# Patient Record
Sex: Female | Born: 1955 | Race: Black or African American | Hispanic: Yes | Marital: Married | Smoking: Former smoker
Health system: Southern US, Community
[De-identification: ages and names within clinical notes are randomized; demographics above are authoritative.]

## PROBLEM LIST (undated history)

## (undated) DIAGNOSIS — F411 Generalized anxiety disorder: Secondary | ICD-10-CM

## (undated) DIAGNOSIS — J438 Other emphysema: Secondary | ICD-10-CM

## (undated) DIAGNOSIS — M2550 Pain in unspecified joint: Secondary | ICD-10-CM

## (undated) DIAGNOSIS — I10 Essential (primary) hypertension: Secondary | ICD-10-CM

## (undated) DIAGNOSIS — N951 Menopausal and female climacteric states: Secondary | ICD-10-CM

## (undated) DIAGNOSIS — F988 Other specified behavioral and emotional disorders with onset usually occurring in childhood and adolescence: Secondary | ICD-10-CM

## (undated) DIAGNOSIS — Z8719 Personal history of other diseases of the digestive system: Secondary | ICD-10-CM

## (undated) DIAGNOSIS — E785 Hyperlipidemia, unspecified: Secondary | ICD-10-CM

## (undated) DIAGNOSIS — I7300 Raynaud's syndrome without gangrene: Secondary | ICD-10-CM

## (undated) DIAGNOSIS — M899 Disorder of bone, unspecified: Secondary | ICD-10-CM

## (undated) DIAGNOSIS — M949 Disorder of cartilage, unspecified: Secondary | ICD-10-CM

## (undated) DIAGNOSIS — G5600 Carpal tunnel syndrome, unspecified upper limb: Secondary | ICD-10-CM

## (undated) DIAGNOSIS — Z86010 Personal history of colonic polyps: Secondary | ICD-10-CM

## (undated) DIAGNOSIS — F329 Major depressive disorder, single episode, unspecified: Secondary | ICD-10-CM

## (undated) DIAGNOSIS — J309 Allergic rhinitis, unspecified: Secondary | ICD-10-CM

## (undated) DIAGNOSIS — N959 Unspecified menopausal and perimenopausal disorder: Secondary | ICD-10-CM

## (undated) DIAGNOSIS — E663 Overweight: Secondary | ICD-10-CM

## (undated) DIAGNOSIS — N750 Cyst of Bartholin's gland: Secondary | ICD-10-CM

## (undated) DIAGNOSIS — G4700 Insomnia, unspecified: Secondary | ICD-10-CM

## (undated) HISTORY — DX: Personal history of other diseases of the digestive system: Z87.19

## (undated) HISTORY — DX: Hyperlipidemia, unspecified: E78.5

## (undated) HISTORY — DX: Other specified behavioral and emotional disorders with onset usually occurring in childhood and adolescence: F98.8

## (undated) HISTORY — DX: Unspecified menopausal and perimenopausal disorder: N95.9

## (undated) HISTORY — DX: Pain in unspecified joint: M25.50

## (undated) HISTORY — DX: Insomnia, unspecified: G47.00

## (undated) HISTORY — PX: BARTHOLIN GLAND CYST EXCISION: SHX565

## (undated) HISTORY — DX: Disorder of bone, unspecified: M89.9

## (undated) HISTORY — DX: Overweight: E66.3

## (undated) HISTORY — DX: Cyst of Bartholin's gland: N75.0

## (undated) HISTORY — DX: Carpal tunnel syndrome, unspecified upper limb: G56.00

## (undated) HISTORY — DX: Essential (primary) hypertension: I10

## (undated) HISTORY — DX: Other emphysema: J43.8

## (undated) HISTORY — DX: Raynaud's syndrome without gangrene: I73.00

## (undated) HISTORY — DX: Disorder of cartilage, unspecified: M94.9

## (undated) HISTORY — DX: Menopausal and female climacteric states: N95.1

## (undated) HISTORY — DX: Personal history of colonic polyps: Z86.010

## (undated) HISTORY — DX: Allergic rhinitis, unspecified: J30.9

## (undated) HISTORY — PX: TONSILLECTOMY: SHX5217

## (undated) HISTORY — DX: Generalized anxiety disorder: F41.1

## (undated) HISTORY — DX: Major depressive disorder, single episode, unspecified: F32.9

---

## 1999-07-13 ENCOUNTER — Other Ambulatory Visit: Admission: RE | Admit: 1999-07-13 | Discharge: 1999-07-13 | Payer: Self-pay | Admitting: Internal Medicine

## 2000-06-27 ENCOUNTER — Other Ambulatory Visit: Admission: RE | Admit: 2000-06-27 | Discharge: 2000-06-27 | Payer: Self-pay | Admitting: Internal Medicine

## 2001-05-29 ENCOUNTER — Other Ambulatory Visit: Admission: RE | Admit: 2001-05-29 | Discharge: 2001-05-29 | Payer: Self-pay | Admitting: Internal Medicine

## 2003-05-10 ENCOUNTER — Other Ambulatory Visit: Admission: RE | Admit: 2003-05-10 | Discharge: 2003-05-10 | Payer: Self-pay | Admitting: Internal Medicine

## 2004-07-21 ENCOUNTER — Other Ambulatory Visit: Admission: RE | Admit: 2004-07-21 | Discharge: 2004-07-21 | Payer: Self-pay | Admitting: Obstetrics and Gynecology

## 2005-06-01 ENCOUNTER — Ambulatory Visit: Payer: Self-pay | Admitting: Internal Medicine

## 2005-06-07 ENCOUNTER — Ambulatory Visit: Payer: Self-pay | Admitting: Internal Medicine

## 2005-08-11 ENCOUNTER — Other Ambulatory Visit: Admission: RE | Admit: 2005-08-11 | Discharge: 2005-08-11 | Payer: Self-pay | Admitting: Obstetrics and Gynecology

## 2006-08-08 ENCOUNTER — Ambulatory Visit: Payer: Self-pay | Admitting: Internal Medicine

## 2006-08-11 ENCOUNTER — Ambulatory Visit: Payer: Self-pay | Admitting: Internal Medicine

## 2006-08-16 ENCOUNTER — Other Ambulatory Visit: Admission: RE | Admit: 2006-08-16 | Discharge: 2006-08-16 | Payer: Self-pay | Admitting: Obstetrics and Gynecology

## 2006-08-23 ENCOUNTER — Ambulatory Visit (HOSPITAL_COMMUNITY): Admission: RE | Admit: 2006-08-23 | Discharge: 2006-08-23 | Payer: Self-pay | Admitting: Obstetrics and Gynecology

## 2006-09-08 ENCOUNTER — Ambulatory Visit: Payer: Self-pay | Admitting: Gastroenterology

## 2006-09-23 ENCOUNTER — Ambulatory Visit: Payer: Self-pay | Admitting: Gastroenterology

## 2006-12-27 ENCOUNTER — Encounter: Payer: Self-pay | Admitting: Internal Medicine

## 2006-12-27 LAB — CONVERTED CEMR LAB: Pap Smear: NORMAL

## 2007-02-14 ENCOUNTER — Ambulatory Visit: Payer: Self-pay | Admitting: Internal Medicine

## 2007-02-27 ENCOUNTER — Ambulatory Visit: Payer: Self-pay | Admitting: Internal Medicine

## 2007-03-09 ENCOUNTER — Encounter: Payer: Self-pay | Admitting: Cardiology

## 2007-03-09 ENCOUNTER — Ambulatory Visit: Payer: Self-pay

## 2007-05-04 ENCOUNTER — Ambulatory Visit: Payer: Self-pay | Admitting: Internal Medicine

## 2007-08-20 DIAGNOSIS — I10 Essential (primary) hypertension: Secondary | ICD-10-CM | POA: Insufficient documentation

## 2007-08-20 DIAGNOSIS — G5600 Carpal tunnel syndrome, unspecified upper limb: Secondary | ICD-10-CM | POA: Insufficient documentation

## 2007-08-20 DIAGNOSIS — F329 Major depressive disorder, single episode, unspecified: Secondary | ICD-10-CM

## 2007-08-20 DIAGNOSIS — E663 Overweight: Secondary | ICD-10-CM | POA: Insufficient documentation

## 2007-08-20 DIAGNOSIS — I7300 Raynaud's syndrome without gangrene: Secondary | ICD-10-CM

## 2007-08-20 DIAGNOSIS — Z860100 Personal history of colon polyps, unspecified: Secondary | ICD-10-CM | POA: Insufficient documentation

## 2007-08-20 DIAGNOSIS — F3289 Other specified depressive episodes: Secondary | ICD-10-CM

## 2007-08-20 DIAGNOSIS — N750 Cyst of Bartholin's gland: Secondary | ICD-10-CM

## 2007-08-20 DIAGNOSIS — E785 Hyperlipidemia, unspecified: Secondary | ICD-10-CM

## 2007-08-20 DIAGNOSIS — F32A Depression, unspecified: Secondary | ICD-10-CM | POA: Insufficient documentation

## 2007-08-20 HISTORY — DX: Other specified depressive episodes: F32.89

## 2007-08-20 HISTORY — DX: Carpal tunnel syndrome, unspecified upper limb: G56.00

## 2007-08-20 HISTORY — DX: Raynaud's syndrome without gangrene: I73.00

## 2007-08-20 HISTORY — DX: Hyperlipidemia, unspecified: E78.5

## 2007-08-20 HISTORY — DX: Cyst of Bartholin's gland: N75.0

## 2007-08-20 HISTORY — DX: Major depressive disorder, single episode, unspecified: F32.9

## 2007-08-20 HISTORY — DX: Personal history of colonic polyps: Z86.010

## 2007-08-20 HISTORY — DX: Personal history of colon polyps, unspecified: Z86.0100

## 2007-08-20 HISTORY — DX: Overweight: E66.3

## 2007-08-20 HISTORY — DX: Essential (primary) hypertension: I10

## 2007-09-22 ENCOUNTER — Ambulatory Visit: Payer: Self-pay | Admitting: Internal Medicine

## 2007-09-22 LAB — CONVERTED CEMR LAB
ALT: 19 units/L (ref 0–35)
AST: 19 units/L (ref 0–37)
Albumin: 3.3 g/dL — ABNORMAL LOW (ref 3.5–5.2)
BUN: 13 mg/dL (ref 6–23)
Bilirubin Urine: NEGATIVE
Bilirubin, Direct: 0.1 mg/dL (ref 0.0–0.3)
Chloride: 110 meq/L (ref 96–112)
Creatinine, Ser: 1.2 mg/dL (ref 0.4–1.2)
Eosinophils Absolute: 0.2 10*3/uL (ref 0.0–0.6)
Eosinophils Relative: 2.3 % (ref 0.0–5.0)
GFR calc non Af Amer: 51 mL/min
Glucose, Bld: 94 mg/dL (ref 70–99)
HDL: 56.1 mg/dL (ref >39.0)
Hemoglobin: 13.1 g/dL (ref 12.0–15.0)
Leukocytes, UA: NEGATIVE
Lymphocytes Relative: 35.1 % (ref 12.0–46.0)
MCHC: 34.3 g/dL (ref 30.0–36.0)
MCV: 85.5 fL (ref 78.0–100.0)
Monocytes Absolute: 0.4 10*3/uL (ref 0.2–0.7)
Neutro Abs: 3.9 10*3/uL (ref 1.4–7.7)
RDW: 13.4 % (ref 11.5–14.6)
Specific Gravity, Urine: 1.015 (ref 1.000–1.03)
TSH: 2.14 microintl units/mL (ref 0.35–5.50)
Total Protein, Urine: NEGATIVE mg/dL
Urine Glucose: NEGATIVE mg/dL
WBC: 6.9 10*3/uL (ref 4.5–10.5)

## 2007-09-29 ENCOUNTER — Ambulatory Visit: Payer: Self-pay | Admitting: Internal Medicine

## 2007-10-01 ENCOUNTER — Encounter: Payer: Self-pay | Admitting: Internal Medicine

## 2007-10-01 DIAGNOSIS — J309 Allergic rhinitis, unspecified: Secondary | ICD-10-CM | POA: Insufficient documentation

## 2007-10-01 DIAGNOSIS — G4700 Insomnia, unspecified: Secondary | ICD-10-CM | POA: Insufficient documentation

## 2007-10-01 DIAGNOSIS — Z8719 Personal history of other diseases of the digestive system: Secondary | ICD-10-CM

## 2007-10-01 HISTORY — DX: Insomnia, unspecified: G47.00

## 2007-10-01 HISTORY — DX: Allergic rhinitis, unspecified: J30.9

## 2007-10-01 HISTORY — DX: Personal history of other diseases of the digestive system: Z87.19

## 2008-02-29 ENCOUNTER — Ambulatory Visit: Payer: Self-pay | Admitting: Endocrinology

## 2008-02-29 DIAGNOSIS — R109 Unspecified abdominal pain: Secondary | ICD-10-CM | POA: Insufficient documentation

## 2008-03-01 LAB — CONVERTED CEMR LAB
ALT: 19 units/L (ref 0–35)
AST: 17 units/L (ref 0–37)
Alkaline Phosphatase: 43 units/L (ref 39–117)
BUN: 7 mg/dL (ref 6–23)
Basophils Relative: 4.9 % — ABNORMAL HIGH (ref 0.0–1.0)
Bilirubin, Direct: 0.1 mg/dL (ref 0.0–0.3)
CO2: 27 meq/L (ref 19–32)
Calcium: 9 mg/dL (ref 8.4–10.5)
Chloride: 107 meq/L (ref 96–112)
Creatinine, Ser: 1.1 mg/dL (ref 0.4–1.2)
Eosinophils Relative: 1.7 % (ref 0.0–5.0)
GFR calc Af Amer: 68 mL/min
Glucose, Bld: 86 mg/dL (ref 70–99)
Monocytes Relative: 4.4 % (ref 3.0–11.0)
Platelets: 273 10*3/uL (ref 150–400)
RDW: 13.4 % (ref 11.5–14.6)
Total Bilirubin: 0.4 mg/dL (ref 0.3–1.2)
Total Protein: 6.2 g/dL (ref 6.0–8.3)
WBC: 8.5 10*3/uL (ref 4.5–10.5)

## 2008-07-15 ENCOUNTER — Encounter: Payer: Self-pay | Admitting: Internal Medicine

## 2008-10-07 ENCOUNTER — Ambulatory Visit: Payer: Self-pay | Admitting: Internal Medicine

## 2008-10-07 LAB — CONVERTED CEMR LAB
ALT: 18 units/L (ref 0–35)
AST: 19 units/L (ref 0–37)
Basophils Absolute: 0 10*3/uL (ref 0.0–0.1)
Basophils Relative: 0.5 % (ref 0.0–3.0)
Bilirubin, Direct: 0.1 mg/dL (ref 0.0–0.3)
CO2: 26 meq/L (ref 19–32)
Chloride: 110 meq/L (ref 96–112)
Ketones, ur: NEGATIVE mg/dL
LDL Cholesterol: 114 mg/dL — ABNORMAL HIGH (ref 0–99)
Leukocytes, UA: NEGATIVE
Lymphocytes Relative: 45.2 % (ref 12.0–46.0)
MCHC: 34.2 g/dL (ref 30.0–36.0)
Monocytes Relative: 5.5 % (ref 3.0–12.0)
Neutrophils Relative %: 46.5 % (ref 43.0–77.0)
Nitrite: NEGATIVE
RBC: 4.63 M/uL (ref 3.87–5.11)
RDW: 13.4 % (ref 11.5–14.6)
Sodium: 141 meq/L (ref 135–145)
Specific Gravity, Urine: 1.015 (ref 1.000–1.03)
Total Bilirubin: 0.6 mg/dL (ref 0.3–1.2)
Total CHOL/HDL Ratio: 3.1
Triglycerides: 102 mg/dL (ref 0–149)
Urobilinogen, UA: 0.2 (ref 0.0–1.0)

## 2008-11-06 ENCOUNTER — Ambulatory Visit: Payer: Self-pay | Admitting: Internal Medicine

## 2008-11-06 DIAGNOSIS — M2550 Pain in unspecified joint: Secondary | ICD-10-CM | POA: Insufficient documentation

## 2008-11-06 DIAGNOSIS — IMO0002 Reserved for concepts with insufficient information to code with codable children: Secondary | ICD-10-CM | POA: Insufficient documentation

## 2008-11-06 HISTORY — DX: Pain in unspecified joint: M25.50

## 2008-11-18 ENCOUNTER — Encounter: Payer: Self-pay | Admitting: Internal Medicine

## 2008-11-22 ENCOUNTER — Telehealth: Payer: Self-pay | Admitting: Internal Medicine

## 2008-12-03 ENCOUNTER — Encounter: Payer: Self-pay | Admitting: Internal Medicine

## 2009-01-07 ENCOUNTER — Encounter: Payer: Self-pay | Admitting: Internal Medicine

## 2009-02-04 ENCOUNTER — Encounter: Payer: Self-pay | Admitting: Internal Medicine

## 2009-03-12 ENCOUNTER — Encounter: Payer: Self-pay | Admitting: Internal Medicine

## 2009-09-26 ENCOUNTER — Ambulatory Visit: Payer: Self-pay | Admitting: Internal Medicine

## 2009-09-26 DIAGNOSIS — N390 Urinary tract infection, site not specified: Secondary | ICD-10-CM | POA: Insufficient documentation

## 2009-09-26 DIAGNOSIS — N951 Menopausal and female climacteric states: Secondary | ICD-10-CM | POA: Insufficient documentation

## 2009-09-26 HISTORY — DX: Menopausal and female climacteric states: N95.1

## 2009-09-26 LAB — CONVERTED CEMR LAB
Bilirubin Urine: NEGATIVE
Ketones, ur: NEGATIVE mg/dL
Urine Glucose: NEGATIVE mg/dL
pH: 5.5 (ref 5.0–8.0)

## 2009-10-08 ENCOUNTER — Encounter: Admission: RE | Admit: 2009-10-08 | Discharge: 2009-10-08 | Payer: Self-pay | Admitting: Neurosurgery

## 2009-10-21 ENCOUNTER — Encounter: Admission: RE | Admit: 2009-10-21 | Discharge: 2009-10-21 | Payer: Self-pay | Admitting: Neurosurgery

## 2009-10-31 ENCOUNTER — Ambulatory Visit: Payer: Self-pay | Admitting: Internal Medicine

## 2009-10-31 LAB — CONVERTED CEMR LAB
ALT: 19 units/L (ref 0–35)
AST: 22 units/L (ref 0–37)
Bilirubin Urine: NEGATIVE
Bilirubin, Direct: 0.1 mg/dL (ref 0.0–0.3)
CO2: 27 meq/L (ref 19–32)
Calcium: 9.6 mg/dL (ref 8.4–10.5)
Cholesterol: 211 mg/dL — ABNORMAL HIGH (ref 0–200)
Direct LDL: 140.6 mg/dL
Glucose, Bld: 88 mg/dL (ref 70–99)
Hemoglobin, Urine: NEGATIVE
Hemoglobin: 12.9 g/dL (ref 12.0–15.0)
Ketones, ur: NEGATIVE mg/dL
Lymphocytes Relative: 42.6 % (ref 12.0–46.0)
MCHC: 33.6 g/dL (ref 30.0–36.0)
RDW: 13.5 % (ref 11.5–14.6)
Sodium: 141 meq/L (ref 135–145)
Total Bilirubin: 0.8 mg/dL (ref 0.3–1.2)
Total Protein, Urine: NEGATIVE mg/dL
Total Protein: 6.6 g/dL (ref 6.0–8.3)
Triglycerides: 104 mg/dL (ref 0.0–149.0)
Urine Glucose: NEGATIVE mg/dL
pH: 5.5 (ref 5.0–8.0)

## 2009-11-07 ENCOUNTER — Ambulatory Visit: Payer: Self-pay | Admitting: Internal Medicine

## 2009-11-07 ENCOUNTER — Telehealth: Payer: Self-pay | Admitting: Internal Medicine

## 2009-11-07 DIAGNOSIS — N959 Unspecified menopausal and perimenopausal disorder: Secondary | ICD-10-CM | POA: Insufficient documentation

## 2009-11-07 HISTORY — DX: Unspecified menopausal and perimenopausal disorder: N95.9

## 2009-11-11 ENCOUNTER — Encounter: Payer: Self-pay | Admitting: Internal Medicine

## 2009-12-12 ENCOUNTER — Telehealth: Payer: Self-pay | Admitting: Internal Medicine

## 2009-12-12 ENCOUNTER — Encounter: Payer: Self-pay | Admitting: Internal Medicine

## 2010-02-19 ENCOUNTER — Ambulatory Visit: Payer: Self-pay | Admitting: Internal Medicine

## 2010-05-11 ENCOUNTER — Ambulatory Visit: Payer: Self-pay | Admitting: Internal Medicine

## 2010-05-11 DIAGNOSIS — H6120 Impacted cerumen, unspecified ear: Secondary | ICD-10-CM | POA: Insufficient documentation

## 2010-05-11 DIAGNOSIS — J029 Acute pharyngitis, unspecified: Secondary | ICD-10-CM | POA: Insufficient documentation

## 2010-06-09 ENCOUNTER — Ambulatory Visit: Payer: Self-pay | Admitting: Internal Medicine

## 2010-06-09 DIAGNOSIS — R059 Cough, unspecified: Secondary | ICD-10-CM | POA: Insufficient documentation

## 2010-06-09 DIAGNOSIS — R05 Cough: Secondary | ICD-10-CM

## 2010-06-10 ENCOUNTER — Encounter: Payer: Self-pay | Admitting: Internal Medicine

## 2010-06-16 ENCOUNTER — Ambulatory Visit: Payer: Self-pay | Admitting: Internal Medicine

## 2010-06-16 DIAGNOSIS — B373 Candidiasis of vulva and vagina: Secondary | ICD-10-CM | POA: Insufficient documentation

## 2010-06-16 DIAGNOSIS — M21619 Bunion of unspecified foot: Secondary | ICD-10-CM | POA: Insufficient documentation

## 2010-06-16 DIAGNOSIS — M899 Disorder of bone, unspecified: Secondary | ICD-10-CM

## 2010-06-16 DIAGNOSIS — M949 Disorder of cartilage, unspecified: Secondary | ICD-10-CM

## 2010-06-16 DIAGNOSIS — J438 Other emphysema: Secondary | ICD-10-CM

## 2010-06-16 HISTORY — DX: Other emphysema: J43.8

## 2010-06-16 HISTORY — DX: Disorder of bone, unspecified: M89.9

## 2010-08-21 ENCOUNTER — Ambulatory Visit: Payer: Self-pay | Admitting: Internal Medicine

## 2010-08-21 DIAGNOSIS — M25519 Pain in unspecified shoulder: Secondary | ICD-10-CM | POA: Insufficient documentation

## 2010-09-04 ENCOUNTER — Telehealth: Payer: Self-pay | Admitting: Internal Medicine

## 2010-09-09 ENCOUNTER — Encounter: Payer: Self-pay | Admitting: Internal Medicine

## 2010-09-25 ENCOUNTER — Encounter: Admission: RE | Admit: 2010-09-25 | Discharge: 2010-09-25 | Payer: Self-pay | Admitting: Orthopedic Surgery

## 2010-11-04 ENCOUNTER — Ambulatory Visit: Payer: Self-pay | Admitting: Internal Medicine

## 2010-11-04 LAB — CONVERTED CEMR LAB
ALT: 28 units/L (ref 0–35)
Alkaline Phosphatase: 76 units/L (ref 39–117)
Basophils Absolute: 0 10*3/uL (ref 0.0–0.1)
Basophils Relative: 0.3 % (ref 0.0–3.0)
Bilirubin, Direct: 0 mg/dL (ref 0.0–0.3)
CO2: 28 meq/L (ref 19–32)
Chloride: 108 meq/L (ref 96–112)
Cholesterol: 186 mg/dL (ref 0–200)
Creatinine, Ser: 1 mg/dL (ref 0.4–1.2)
Eosinophils Relative: 3 % (ref 0.0–5.0)
Glucose, Bld: 87 mg/dL (ref 70–99)
Hemoglobin: 13.9 g/dL (ref 12.0–15.0)
LDL Cholesterol: 111 mg/dL — ABNORMAL HIGH (ref 0–99)
Lymphocytes Relative: 45 % (ref 12.0–46.0)
Monocytes Relative: 6.1 % (ref 3.0–12.0)
Neutro Abs: 2.9 10*3/uL (ref 1.4–7.7)
RBC: 4.68 M/uL (ref 3.87–5.11)
RDW: 14.2 % (ref 11.5–14.6)
Specific Gravity, Urine: 1.02 (ref 1.000–1.030)
Total CHOL/HDL Ratio: 3
Total Protein: 6.5 g/dL (ref 6.0–8.3)
Urobilinogen, UA: 0.2 (ref 0.0–1.0)
WBC: 6.4 10*3/uL (ref 4.5–10.5)

## 2010-11-10 ENCOUNTER — Ambulatory Visit: Payer: Self-pay | Admitting: Internal Medicine

## 2010-11-10 ENCOUNTER — Encounter: Payer: Self-pay | Admitting: Internal Medicine

## 2010-11-10 DIAGNOSIS — F411 Generalized anxiety disorder: Secondary | ICD-10-CM

## 2010-11-10 DIAGNOSIS — F988 Other specified behavioral and emotional disorders with onset usually occurring in childhood and adolescence: Secondary | ICD-10-CM

## 2010-11-10 HISTORY — DX: Generalized anxiety disorder: F41.1

## 2010-11-10 HISTORY — DX: Other specified behavioral and emotional disorders with onset usually occurring in childhood and adolescence: F98.8

## 2011-01-22 ENCOUNTER — Telehealth: Payer: Self-pay | Admitting: Internal Medicine

## 2011-01-28 NOTE — Letter (Signed)
Summary: Results Follow-up Letter  [facility]  [address] [place], [place] [address]   Phone: [phone]  Fax: [phone]    06/10/2010  [address] [place], [place]  [address]  Dear Ms. PASSARELLI,   The following are the results of your recent test(s):  Test     Result     Chest Xray     Emphysema   _________________________________________________________  Please call for an appointment soon _________________________________________________________ _________________________________________________________ _________________________________________________________  Sincerely,  Sanda Linger MD [facility]

## 2011-01-28 NOTE — Assessment & Plan Note (Signed)
Summary: CPX/NWS   Vital Signs:  Patient profile:   55 year old female Height:      63 inches Weight:      150.50 pounds BMI:     26.76 O2 Sat:      98 % on Room air Temp:     97.4 degrees F oral Pulse rate:   62 / minute BP sitting:   100 / 62  (left arm) Cuff size:   regular  Vitals Entered By: Zella Ball Ewing CMA Duncan Dull) (November 10, 2010 1:24 PM)  O2 Flow:  Room air  Preventive Care Screening  Bone Density:    Date:  11/07/2009    Next Due:  10/2011    Results:  abnormal std dev  Mammogram:    Date:  09/09/2010    Results:  normal   Pap Smear:    Date:  07/27/2010    Results:  normal      had flu shot approx one month ago  CC: ADult Physical/RE   Primary Care [facility]brough:  Corwin Levins MD  CC:  ADult Physical/RE.  History of Present Illness: here for wellness - hard to lose wt tough she has been doing better with lower chol diet; Pt denies CP, worsening sob, doe, wheezing, orthopnea, pnd, worsening LE edema, palps, dizziness or syncope  Pt denies new neuro symptoms such as headache, facial or extremity weakness  Pt denies polydipsia, polyuria. Overall good compliance with meds, trying to follow low chol  diet, wt stable, little excercise however Denies worsening depressive symptoms, suicidal ideation, or panic, but does have singificant ongoing anxiety and asks to re-start the lexapro she improved with in the past  Overall good compliance with meds, and good tolerability.  No fever, wt loss, night sweats, loss of appetite or other constitutional symptoms  Pt states good ability with ADL's, low fall risk, home safety reviewed and adequate, no significant change in hearing or vision, trying to follow lower chol diet, and occasionally active only with regular excercise.  Daughter with ADD adn pt asks for referral for evaluation as she has similar symtpoms  Problems Prior to Update: 1)  Anxiety  (ICD-300.00) 2)  Attention Deficit Disorder  (ICD-314.00) 3)  Shoulder Pain,  Right  (ICD-719.41) 4)  Osteopenia  (ICD-733.90) 5)  Vaginitis, Candidal  (ICD-112.1) 6)  Bunion, Left Foot  (ICD-727.1) 7)  Emphysema  (ICD-492.8) 8)  Cough  (ICD-786.2) 9)  Cerumen Impaction  (ICD-380.4) 10)  Acute Pharyngitis  (ICD-462) 11)  Menopausal Disorder  (ICD-627.9) 12)  Preventive Health Care  (ICD-V70.0) 13)  Hot Flashes  (ICD-627.2) 14)  Insomnia-sleep Disorder-unspec  (ICD-780.52) 15)  Uti  (ICD-599.0) 16)  Polyarthralgia  (ICD-719.49) 17)  Lumbar Radiculopathy, Left  (ICD-724.4) 18)  Preventive Health Care  (ICD-V70.0) 19)  Abdominal Pain, Unspecified Site  (ICD-789.00) 20)  Preventive Health Care  (ICD-V70.0) 21)  Family History of Cad Female 1st Degree Relative <60  (ICD-V16.49) 22)  Ischemic Colitis, Hx of  (ICD-V12.79) 23)  Allergic Rhinitis  (ICD-477.9) 24)  Hx of Symptom, Insomnia Nos  (ICD-780.52) 25)  Hyperlipidemia  (ICD-272.4) 26)  Obesity, 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 1 By Mouth Once Daily 2)  Tramadol Hcl 50  Mg Tabs (Tramadol Hcl) .Marland Kitchen.. 1 -2 By Mouth Qid As Needed 3)  Zolpidem Tartrate 10 Mg Tabs (Zolpidem Tartrate) .Marland Kitchen.. 1po At Bedtime As Needed 4)  Flexeril 10 Mg Tabs (Cyclobenzaprine Hcl) .Marland Kitchen.. 1 By Mouth Three Times A Day As Needed For Muscle Spasm - May Cause Sedation 5)  Mytussin Dac 30-10-100 Mg/44ml Soln (Pseudoephedrine-Codeine-Gg) .... 5-10 Ml By Mouth Qid As Needed For Cough 6)  Combivent 18-103 Mcg/act Aero (Ipratropium-Albuterol) .... 2 Puffs Four Times Per Day As Needed 7)  Prednisone 10 Mg Tabs (Prednisone) .... 3po Qd For 3days, Then 2po Qd For 3days, Then 1po Qd For 3days, Then Stop  Current Medications (verified): 1)  Nifediac Cc 30 Mg  Tb24 (Nifedipine) .Marland Kitchen.. 1  By Mouth Once Daily 2)  Tramadol Hcl 50 Mg Tabs (Tramadol Hcl) .Marland Kitchen.. 1 -2 By Mouth Qid As Needed 3)  Zolpidem Tartrate 10 Mg Tabs (Zolpidem Tartrate) .Marland Kitchen.. 1po At Bedtime As Needed 4)  Flexeril 10 Mg Tabs (Cyclobenzaprine Hcl) .Marland Kitchen.. 1 By Mouth Three Times A Day As Needed For Muscle Spasm - May Cause Sedation 5)  Combivent 18-103 Mcg/act Aero (Ipratropium-Albuterol) .... 2 Puffs Four Times Per Day As Needed 6)  Vagifem 10 Mcg Tabs (Estradiol) .... Use Asd Twice Weekly 7)  Lexapro 10 Mg Tabs (Escitalopram Oxalate) .Marland Kitchen.. 1po Once Daily  Allergies (verified): No Known Drug Allergies  Past History:  Past Surgical History: Last updated: 08/20/2007 Tonsillectomy Bartholin cyst incision and drainage  Family History: Last updated: 11/07/2009 Family History of CAD Female 1st degree relative <60 Family History Hypertension Family History of Prostate CA 1st degree relative <50 Family History Thyroid disease 1 daughter iwth ADD mother and grandmother with osteoporosis  Social History: Last updated: 08/21/2010 Occupation:legal assistant Former Smoker Alcohol use-yes Drug use-no 1 daughter  Risk Factors: Alcohol Use: 0 (06/09/2010) Exercise: yes (10/01/2007)  Risk Factors: Smoking Status: quit (06/09/2010)  Past Medical History: Hx of Bartholin Cyst Carpal Tunnel Syndrome Raynaud's syndrome Depression Hypertension Colonic polyps, hx of   Mild Obesity Hyperlipidemia insomnia Allergic rhinitis ischemic colitis Anxiety  Review of Systems  The patient denies anorexia, fever, vision loss, decreased hearing, hoarseness, chest pain, syncope, dyspnea on exertion, peripheral edema, prolonged cough, headaches, hemoptysis, abdominal pain, melena, hematochezia, severe indigestion/heartburn, hematuria, muscle weakness, suspicious skin lesions, transient blindness, difficulty walking, depression, unusual weight change, abnormal bleeding, enlarged lymph nodes, and angioedema.         all  otherwise negative per pt -  except for recurrent sciatica though much less as she does yoga often;  also with recurrent neck pain   Impression & Recommendations:  Problem # 1:  Preventive Health Care (ICD-V70.0) Overall doing well, age appropriate education and counseling updated, referral for preventive services and immunizations addressed, dietary counseling and smoking status adressed , most recent labs reviewed, ecg reviewed I have personally reviewed and have noted 1.The patient's medical and social history 2.Their use of alcohol, tobacco or illicit drugs 3.Their current medications and supplements 4. Functional ability including ADL's, fall risk, home safety risk, hearing & visual impairment   5.Diet and physical activities 6.Evidence for depression or mood disorders The patients weight, height, BMI  have been recorded in the chart I have made referrals, counseling and provided education to the patient based review of the above  Orders: EKG w/ Interpretation (93000)  Problem # 2:  ATTENTION DEFICIT DISORDER (ICD-314.00) ok for referral Orders: Psychology Referral (Psychology)  Problem # 3:  ANXIETY (ICD-300.00)  Her updated medication list for this  problem includes:    Lexapro 10 Mg Tabs (Escitalopram oxalate) .Marland Kitchen... 1po once daily treat as above, f/u any worsening signs or symptoms   Complete Medication List: 1)  Nifediac Cc 30 Mg Tb24 (Nifedipine) .Marland Kitchen.. 1 by mouth once daily 2)  Tramadol Hcl 50 Mg Tabs (Tramadol hcl) .Marland Kitchen.. 1 -2 by mouth qid as needed 3)  Zolpidem Tartrate 10 Mg Tabs (Zolpidem tartrate) .Marland Kitchen.. 1po at bedtime as needed 4)  Flexeril 10 Mg Tabs (Cyclobenzaprine hcl) .Marland Kitchen.. 1 by mouth three times a day as needed for muscle spasm - may cause sedation 5)  Combivent 18-103 Mcg/act Aero (Ipratropium-albuterol) .... 2 puffs four times per day as needed 6)  Vagifem 10 Mcg Tabs (Estradiol) .... Use asd twice weekly 7)  Lexapro 10 Mg Tabs (Escitalopram oxalate) .Marland Kitchen.. 1po  once daily  Other Orders: Tdap => 74yrs IM (09811) Admin 1st Vaccine (91478)  Patient Instructions: 1)  you had the tetanus shot today 2)  Please take all new medications as prescribed - the lexapro 10 mg per day 3)  Continue all previous medications as before this visit  4)  You will be contacted about the referral(s) to: ADD testing with psychology 5)  Please schedule a follow-up appointment in 1 year, or sooner if needed Prescriptions: LEXAPRO 10 MG TABS (ESCITALOPRAM OXALATE) 1po once daily  #90 x 3   Entered and Authorized by:   Corwin Levins MD   Signed by:   Corwin Levins MD on 11/10/2010   Method used:   Print then Give to Patient   RxID:   [id number] COMBIVENT 18-103 MCG/ACT AERO (IPRATROPIUM-ALBUTEROL) 2 puffs four times per day as needed  #3 x 3   Entered and Authorized by:   Corwin Levins MD   Signed by:   Corwin Levins MD on 11/10/2010   Method used:   Print then Give to Patient   RxID:   [id number] FLEXERIL 10 MG TABS (CYCLOBENZAPRINE HCL) 1 by mouth three times a day as needed for muscle spasm - may cause sedation  #90 x 1   Entered and Authorized by:   Corwin Levins MD   Signed by:   Corwin Levins MD on 11/10/2010   Method used:   Print then Give to Patient   RxID:   [id number] TRAMADOL HCL 50 MG TABS (TRAMADOL HCL) 1 -2 by mouth qid as needed  #120 x 1   Entered and Authorized by:   Corwin Levins MD   Signed by:   Corwin Levins MD on 11/10/2010   Method used:   Print then Give to Patient   RxID:   [id number] NIFEDIAC CC 30 MG  TB24 (NIFEDIPINE) 1 by mouth once daily  #90 x 1   Entered and Authorized by:   Corwin Levins MD   Signed by:   Corwin Levins MD on 11/10/2010   Method used:   Print then Give to Patient   RxID:   [id number] ZOLPIDEM TARTRATE 10 MG TABS (ZOLPIDEM TARTRATE) 1po at bedtime as needed  #90 x 1   Entered and Authorized by:   Corwin Levins MD   Signed by:   Corwin Levins MD on 11/10/2010   Method used:   Print then Give to  Patient   RxID:   [id number]    Orders Added: 1)  EKG w/ Interpretation [93000] 2)  Tdap => 2yrs IM [37628] 3)  Admin 1st Vaccine [90471] 4)  Psychology  Referral [Psychology] 5)  Est. Patient 40-64 years [99396]   Immunizations Administered:  Tetanus Vaccine:    Vaccine Type: Tdap    Site: left deltoid    Mfr: GlaxoSmithKline    Dose: 0.5 ml    Route: IM    Given by: Zella Ball Ewing CMA (AAMA)    Exp. Date: 10/15/2012    Lot #: [id number]    VIS given: 11/13/08 version given November 10, 2010.   Immunizations Administered:  Tetanus Vaccine:    Vaccine Type: Tdap    Site: left deltoid    Mfr: GlaxoSmithKline    Dose: 0.5 ml    Route: IM    Given by: Zella Ball Ewing CMA (AAMA)    Exp. Date: 10/15/2012    Lot #: [id number]    VIS given: 11/13/08 version given November 10, 2010.

## 2011-01-28 NOTE — Assessment & Plan Note (Signed)
Summary: sore throat/chest congestion/Annette [name]/cd   Vital Signs:  Patient profile:   55 year old female Height:      63 inches Weight:      148 pounds BMI:     26.31 O2 Sat:      98 % on Room air Temp:     97.2 degrees F oral Pulse rate:   70 / minute Pulse rhythm:   regular Resp:     16 per minute BP sitting:   102 / 70  (left arm) Cuff size:   large  Vitals Entered By: Rock Nephew CMA (June 09, 2010 10:12 AM)  Nutrition Counseling: Patient's BMI is greater than 25 and therefore counseled on weight management options.  O2 Flow:  Room air CC: chest congesstion, headache, L ear pain, w thick dark yello mucus x 2days, URI symptoms Is Patient Diabetic? No Pain Assessment Patient in pain? no        Primary Care Provider:  Corwin Levins MD  CC:  chest congesstion, headache, L ear pain, w thick dark yello mucus x 2days, and URI symptoms.  History of Present Illness:  URI Symptoms      This is a 55 year old woman who presents with URI symptoms.  The symptoms began 2 days ago.  The severity is described as mild.  The patient reports purulent nasal discharge, sore throat, productive cough, and sick contacts, but denies earache.  The patient denies fever, stiff neck, dyspnea, wheezing, rash, vomiting, diarrhea, use of an antipyretic, and response to antipyretic.  The patient also reports headache.  The patient denies sneezing, seasonal symptoms, muscle aches, and severe fatigue.  Risk factors for Strep sinusitis include unilateral facial pain, unilateral nasal discharge, and poor response to decongestant.  The patient denies the following risk factors for Strep sinusitis: Strep exposure, tender adenopathy, and absence of cough.    Preventive Screening-Counseling & Management  Alcohol-Tobacco     Alcohol drinks/day: 0     Smoking Status: quit  Medications Prior to Update: 1)  Nifediac Cc 30 Mg  Tb24 (Nifedipine) .Marland Kitchen.. 1 By Mouth Once Daily 2)  Tramadol Hcl 50 Mg Tabs (Tramadol Hcl)  .Marland Kitchen.. 1 -2 By Mouth Qid As Needed 3)  Zolpidem Tartrate 10 Mg Tabs (Zolpidem Tartrate) .Marland Kitchen.. 1po At Bedtime As Needed 4)  Flexeril 10 Mg Tabs (Cyclobenzaprine Hcl) .Marland Kitchen.. 1 By Mouth Three Times A Day As Needed For Muscle Spasm - May Cause Sedation  Current Medications (verified): 1)  Nifediac Cc 30 Mg  Tb24 (Nifedipine) .Marland Kitchen.. 1 By Mouth Once Daily 2)  Tramadol Hcl 50 Mg Tabs (Tramadol Hcl) .Marland Kitchen.. 1 -2 By Mouth Qid As Needed 3)  Zolpidem Tartrate 10 Mg Tabs (Zolpidem Tartrate) .Marland Kitchen.. 1po At Bedtime As Needed 4)  Flexeril 10 Mg Tabs (Cyclobenzaprine Hcl) .Marland Kitchen.. 1 By Mouth Three Times A Day As Needed For Muscle Spasm - May Cause Sedation 5)  Zithromax Tri-Pak 500 Mg Tab (Azithromycin) .... Take One By Mouth Once Daily For 3 Days 6)  Mytussin Dac 30-10-100 Mg/11ml Soln (Pseudoephedrine-Codeine-Gg) .... 5-10 Ml By Mouth Qid As Needed For Cough  Allergies (verified): No Known Drug Allergies  Past History:  Past Medical History: Last updated: 05/11/2010 Hx of Bartholin Cyst Carpal Tunnel Syndrome Raynaud's syndrome Depression Hypertension Colonic polyps, hx of   Mild Obesity Hyperlipidemia insomnia Allergic rhinitis ischemic colitis  Past Surgical History: Last updated: 08/20/2007 Tonsillectomy Bartholin cyst incision and drainage  Family History: Last updated: 11/07/2009 Family History of CAD Female 1st  degree relative <60 Family History Hypertension Family History of Prostate CA 1st degree relative <50 Family History Thyroid disease 1 daughter iwth ADD mother and grandmother with osteoporosis  Social History: Last updated: 10/01/2007 Occupation:legal assistant Former Smoker Alcohol use-yes  Risk Factors: Alcohol Use: 0 (06/09/2010) Exercise: yes (10/01/2007)  Risk Factors: Smoking Status: quit (06/09/2010)  Family History: Reviewed history from 11/07/2009 and no changes required. Family History of CAD Female 1st degree relative <60 Family History Hypertension Family  History of Prostate CA 1st degree relative <50 Family History Thyroid disease 1 daughter iwth ADD mother and grandmother with osteoporosis  Social History: Reviewed history from 10/01/2007 and no changes required. Occupation:legal assistant Former Smoker Alcohol use-yes  Review of Systems  The patient denies anorexia, fever, decreased hearing, hoarseness, chest pain, abdominal pain, hematuria, suspicious skin lesions, and enlarged lymph nodes.    Physical Exam  General:  alert, well-developed, well-nourished, well-hydrated, appropriate dress, normal appearance, healthy-appearing, and cooperative to examination.   Head:  normocephalic and atraumatic.   Eyes:  No corneal or conjunctival inflammation noted. EOMI. Perrla. Funduscopic exam benign, without hemorrhages, exudates or papilledema. Vision grossly normal. Ears:  R ear normal and L ear normal.   Nose:  no external deformity, no airflow obstruction, no intranasal foreign body, no nasal polyps, no nasal mucosal lesions, no mucosal friability, no active bleeding or clots, no sinus percussion tenderness, no septum abnormalities, nasal dischargemucosal pallor, and mucosal edema.   Mouth:  Oral mucosa and oropharynx without lesions or exudates.  Teeth in good repair. Neck:  supple, full ROM, no masses, no thyromegaly, no thyroid nodules or tenderness, normal carotid upstroke, no carotid bruits, no cervical lymphadenopathy, and no neck tenderness.   Lungs:  normal respiratory effort, no intercostal retractions, no accessory muscle use, normal breath sounds, no dullness, no fremitus, no crackles, and no wheezes.   Heart:  normal rate, regular rhythm, no murmur, no gallop, no rub, and no JVD.   Abdomen:  soft, non-tender, normal bowel sounds, no distention, no masses, no guarding, no rigidity, no rebound tenderness, no abdominal hernia, no hepatomegaly, and no splenomegaly.   Msk:  normal ROM, no joint tenderness, no joint swelling, no joint  warmth, no redness over joints, no joint deformities, no joint instability, no crepitation, and no muscle atrophy.   Pulses:  R and L carotid,radial,femoral,dorsalis pedis and posterior tibial pulses are full and equal bilaterally Extremities:  No clubbing, cyanosis, edema, or deformity noted with normal full range of motion of all joints.   Neurologic:  No cranial nerve deficits noted. Station and gait are normal. Plantar reflexes are down-going bilaterally. DTRs are symmetrical throughout. Sensory, motor and coordinative functions appear intact. Skin:  turgor normal, color normal, no rashes, no suspicious lesions, no ecchymoses, no petechiae, no purpura, no ulcerations, and no edema.   Cervical Nodes:  no anterior cervical adenopathy and no posterior cervical adenopathy.   Axillary Nodes:  no R axillary adenopathy and no L axillary adenopathy.   Inguinal Nodes:  no R inguinal adenopathy and no L inguinal adenopathy.   Psych:  Cognition and judgment appear intact. Alert and cooperative with normal attention span and concentration. No apparent delusions, illusions, hallucinations   Impression & Recommendations:  Problem # 1:  COUGH (ICD-786.2)  Orders: T-2 View CXR (71020TC)  Problem # 2:  BRONCHITIS-ACUTE (ICD-466.0) Assessment: New  I will cover atypicals this time and check chest xray for pna/lesions/etc Her updated medication list for this problem includes:    Zithromax Tri-pak  500 Mg Tab (Azithromycin) .Marland Kitchen... Take one by mouth once daily for 3 days    Mytussin Dac 30-10-100 Mg/70ml Soln (Pseudoephedrine-codeine-gg) .Marland Kitchen... 5-10 ml by mouth qid as needed for cough  Orders: Admin of Therapeutic Inj  intramuscular or subcutaneous (47425) Depo- Medrol 40mg  (J1030) Depo- Medrol 80mg  (J1040)  Complete Medication List: 1)  Nifediac Cc 30 Mg Tb24 (Nifedipine) .Marland Kitchen.. 1 by mouth once daily 2)  Tramadol Hcl 50 Mg Tabs (Tramadol hcl) .Marland Kitchen.. 1 -2 by mouth qid as needed 3)  Zolpidem Tartrate 10 Mg  Tabs (Zolpidem tartrate) .Marland Kitchen.. 1po at bedtime as needed 4)  Flexeril 10 Mg Tabs (Cyclobenzaprine hcl) .Marland Kitchen.. 1 by mouth three times a day as needed for muscle spasm - may cause sedation 5)  Zithromax Tri-pak 500 Mg Tab (Azithromycin) .... Take one by mouth once daily for 3 days 6)  Mytussin Dac 30-10-100 Mg/29ml Soln (Pseudoephedrine-codeine-gg) .... 5-10 ml by mouth qid as needed for cough  Patient Instructions: 1)  Please schedule a follow-up appointment in 2 weeks. 2)  Take your antibiotic as prescribed until ALL of it is gone, but stop if you develop a rash or swelling and contact our office as soon as possible. 3)  Acute bronchitis symptoms for less than 10 days are not helped by antibiotics. take over the counter cough medications. call if no improvment in  5-7 days, sooner if increasing cough, fever, or new symptoms( shortness of breath, chest pain). Prescriptions: MYTUSSIN DAC 30-10-100 MG/5ML SOLN (PSEUDOEPHEDRINE-CODEINE-GG) 5-10 ml by mouth QID as needed for cough  #8 ounces x 0   Entered and Authorized by:   Etta Grandchild MD   Signed by:   Etta Grandchild MD on 06/09/2010   Method used:   Print then Give to Patient   RxID:   [id number] ZITHROMAX TRI-PAK 500 MG TAB (AZITHROMYCIN) Take one by mouth once daily for 3 days  #3 x 0   Entered and Authorized by:   Etta Grandchild MD   Signed by:   Etta Grandchild MD on 06/09/2010   Method used:   Electronically to        [facility]  [address] [id number] (retail)       [address]       [place], [place]  [address]       Ph: [phone] or [phone]       Fax: [phone]   RxID:   [id number]    Medication Administration  Injection # 1:    Medication: Depo- Medrol 80mg     Diagnosis: BRONCHITIS-ACUTE (ICD-466.0)    Route: IM    Site: R deltoid    Exp Date: 03/2013    Lot #: [id number]    Mfr: pfizer    Patient tolerated injection without complications    Given by: Rock Nephew CMA (June 09, 2010 1:18  PM)  Injection # 2:    Medication: Depo- Medrol 40mg     Diagnosis: BRONCHITIS-ACUTE (ICD-466.0)    Route: IM    Site: R deltoid    Exp Date: 03/2013    Lot #: [id number]    Mfr: pfizer    Patient tolerated injection without complications    Given by: Rock Nephew CMA (June 09, 2010 1:17 PM)  Orders Added: 1)  Est. Patient Level IV [94854] 2)  Admin of Therapeutic Inj  intramuscular or subcutaneous [96372] 3)  Depo- Medrol 40mg  [J1030] 4)  Depo- Medrol 80mg  [J1040] 5)  T-2  View CXR [71020TC]

## 2011-01-28 NOTE — Assessment & Plan Note (Signed)
Summary: FU / NWS  #   Vital Signs:  Patient profile:   55 year old female Height:      63 inches Weight:      149.50 pounds BMI:     26.58 O2 Sat:      97 % on Room air Temp:     98.7 degrees F oral Pulse rate:   73 / minute BP sitting:   114 / 76  (left arm) Cuff size:   regular  Vitals Entered By: Margaret Pyle, CMA (June 16, 2010 4:15 PM)  O2 Flow:  Room air CC: F/U recent X-Ray   Primary Care Provider:  Corwin Levins MD  CC:  F/U recent X-Ray.  History of Present Illness: here in f/u with recent CXR c/w emphysema per radiology;  pt c/o significant but mild DOE with excercise as she tries to do several times per wk at the gym on several of the machines;  also with recurrent cough wtih chest congestion, clear sputum now, not tried any otc such as mucinex.  Pt denies CP, wheezing, orthopnea, pnd, worsening LE edema, palps, dizziness or syncope , and no hx of asthma.  Pt denies new neuro symptoms such as headache, facial or extremity weakness   Seems to have yeast infection after last antibx.  Also with several wks increased left bunion, worse after long standing, walking and gym machines.  Also requests results of recent dxa.  Overall good compliance with meds, good tolerance.    Problems Prior to Update: 1)  Bunion, Left Foot  (ICD-727.1) 2)  Emphysema  (ICD-492.8) 3)  Bronchitis-acute  (ICD-466.0) 4)  Cough  (ICD-786.2) 5)  Cerumen Impaction  (ICD-380.4) 6)  Acute Pharyngitis  (ICD-462) 7)  Menopausal Disorder  (ICD-627.9) 8)  Preventive Health Care  (ICD-V70.0) 9)  Hot Flashes  (ICD-627.2) 10)  Insomnia-sleep Disorder-unspec  (ICD-780.52) 11)  Uti  (ICD-599.0) 12)  Polyarthralgia  (ICD-719.49) 13)  Lumbar Radiculopathy, Left  (ICD-724.4) 14)  Preventive Health Care  (ICD-V70.0) 15)  Abdominal Pain, Unspecified Site  (ICD-789.00) 16)  Preventive Health Care  (ICD-V70.0) 17)  Family History of Cad Female 1st Degree Relative <60  (ICD-V16.49) 18)  Ischemic  Colitis, Hx of  (ICD-V12.79) 19)  Allergic Rhinitis  (ICD-477.9) 20)  Hx of Symptom, Insomnia Nos  (ICD-780.52) 21)  Hyperlipidemia  (ICD-272.4) 22)  Obesity, 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 1 By Mouth Once Daily 2)  Tramadol Hcl 50 Mg Tabs (Tramadol Hcl) .Marland Kitchen.. 1 -2 By Mouth Qid As Needed 3)  Zolpidem Tartrate 10 Mg Tabs (Zolpidem Tartrate) .Marland Kitchen.. 1po At Bedtime As Needed 4)  Flexeril 10 Mg Tabs (Cyclobenzaprine Hcl) .Marland Kitchen.. 1 By Mouth Three Times A Day As Needed For Muscle Spasm - May Cause Sedation 5)  Mytussin Dac 30-10-100 Mg/40ml Soln (Pseudoephedrine-Codeine-Gg) .... 5-10 Ml By Mouth Qid As Needed For Cough  Current Medications (verified): 1)  Nifediac Cc 30 Mg  Tb24 (Nifedipine) .Marland Kitchen.. 1 By Mouth Once Daily 2)  Tramadol Hcl 50 Mg Tabs (Tramadol Hcl) .Marland Kitchen.. 1 -2 By Mouth Qid As Needed 3)  Zolpidem Tartrate 10 Mg Tabs (Zolpidem Tartrate) .Marland Kitchen.. 1po At Bedtime As Needed 4)  Flexeril 10 Mg Tabs (Cyclobenzaprine Hcl) .Marland KitchenMarland KitchenMarland Kitchen  1 By Mouth Three Times A Day As Needed For Muscle Spasm - May Cause Sedation 5)  Mytussin Dac 30-10-100 Mg/9ml Soln (Pseudoephedrine-Codeine-Gg) .... 5-10 Ml By Mouth Qid As Needed For Cough 6)  Combivent 18-103 Mcg/act Aero (Ipratropium-Albuterol) .... 2 Puffs Four Times Per Day As Needed 7)  Fluconazole 150 Mg Tabs (Fluconazole) .Marland Kitchen.. 1po Every Other Day As Needed  Allergies (verified): No Known Drug Allergies  Past History:  Past Medical History: Last updated: 05/11/2010 Hx of Bartholin Cyst Carpal Tunnel Syndrome Raynaud's syndrome Depression Hypertension Colonic polyps, hx of   Mild Obesity Hyperlipidemia insomnia Allergic rhinitis ischemic colitis  Past Surgical History: Last updated:  08/20/2007 Tonsillectomy Bartholin cyst incision and drainage  Social History: Last updated: 10/01/2007 Occupation:legal assistant Former Smoker Alcohol use-yes  Risk Factors: Alcohol Use: 0 (06/09/2010) Exercise: yes (10/01/2007)  Risk Factors: Smoking Status: quit (06/09/2010)  Review of Systems       all otherwise negative per pt -    Physical Exam  General:  alert and well-developed.   Head:  normocephalic and atraumatic.   Eyes:  vision grossly intact, pupils equal, and pupils round.   Ears:  R ear normal and L ear normal.   Nose:  no external deformity and no nasal discharge.   Mouth:  no gingival abnormalities and pharynx pink and moist.   Neck:  supple and no masses.   Lungs:  normal respiratory effort and normal breath sounds.   Heart:  normal rate and regular rhythm.   Msk:  left bunion mild tender, inflamed Extremities:  no edema, no erythema    Impression & Recommendations:  Problem # 1:  EMPHYSEMA (ICD-492.8) per recent cxr , to confirm with PFT's, also trial combivent with excercise due to mild symtpoms doe  Problem # 2:  BUNION, LEFT FOOT (ICD-727.1)  ok for podiatry referral  Orders: Podiatry Referral (Podiatry)  Problem # 3:  VAGINITIS, CANDIDAL (ICD-112.1)  Her updated medication list for this problem includes:    Fluconazole 150 Mg Tabs (Fluconazole) .Marland Kitchen... 1po every other day as needed treat as above, f/u any worsening signs or symptoms   Problem # 4:  OSTEOPENIA (ICD-733.90) mild lumbar only - for ca/vit d; excercise;  no need rx med tx at this time, f/u dxa 2 yrs  Complete Medication List: 1)  Nifediac Cc 30 Mg Tb24 (Nifedipine) .Marland Kitchen.. 1 by mouth once daily 2)  Tramadol Hcl 50 Mg Tabs (Tramadol hcl) .Marland Kitchen.. 1 -2 by mouth qid as needed 3)  Zolpidem Tartrate 10 Mg Tabs (Zolpidem tartrate) .Marland Kitchen.. 1po at bedtime as needed 4)  Flexeril 10 Mg Tabs (Cyclobenzaprine hcl) .Marland Kitchen.. 1 by mouth three times a day as needed for muscle spasm - may cause  sedation 5)  Mytussin Dac 30-10-100 Mg/16ml Soln (Pseudoephedrine-codeine-gg) .... 5-10 ml by mouth qid as needed for cough 6)  Combivent 18-103 Mcg/act Aero (Ipratropium-albuterol) .... 2 puffs four times per day as needed 7)  Fluconazole 150 Mg Tabs (Fluconazole) .Marland Kitchen.. 1po every other day as needed  Patient Instructions: 1)  Please take all new medications as prescribed 2)  You can also use Mucinex OTC or it's generic for congestion  3)  You will be contacted about the referral(s) to: lung testing 4)  Please take all new medications as prescribed   - the inhaler, and the yeast medication 5)  Continue all previous medications as before this visit  6)  You will be contacted about the referral(s) to: Podiatry 7)  Please schedule a follow-up  appointment in 6 months with CPX labs Prescriptions: FLUCONAZOLE 150 MG TABS (FLUCONAZOLE) 1po every other day as needed  #2 x 1   Entered and Authorized by:   Corwin Levins MD   Signed by:   Corwin Levins MD on 06/16/2010   Method used:   Print then Give to Patient   RxID:   [id number] COMBIVENT 18-103 MCG/ACT AERO (IPRATROPIUM-ALBUTEROL) 2 puffs four times per day as needed  #1 x 11   Entered and Authorized by:   Corwin Levins MD   Signed by:   Corwin Levins MD on 06/16/2010   Method used:   Print then Give to Patient   RxID:   [id number]

## 2011-01-28 NOTE — Assessment & Plan Note (Signed)
Summary: sore throat,upper resp inf?congstion/cd   Vital Signs:  Patient profile:   55 year old female Height:      63 inches Weight:      149 pounds BMI:     26.49 O2 Sat:      98 % on Room air Temp:     98.4 degrees F oral Pulse rate:   73 / minute BP sitting:   122 / 78  (left arm) Cuff size:   regular  Vitals Entered ByMarland Kitchen Zella Ball Ewing (February 19, 2010 3:40 PM)  O2 Flow:  Room air CC: sore throat, chest pain, refill/RE   CC:  sore throat, chest pain, and refill/RE.  History of Present Illness: here wtih acute onset 2 to 3 days fever, ST, headache , chills and increased prod cough greenish sputum, Pt denies CP, sob, doe, wheezing, orthopnea, pnd, worsening LE edema, palps, dizziness or syncope    Problems Prior to Update: 1)  Bronchitis-acute  (ICD-466.0) 2)  Menopausal Disorder  (ICD-627.9) 3)  Preventive Health Care  (ICD-V70.0) 4)  Hot Flashes  (ICD-627.2) 5)  Insomnia-sleep Disorder-unspec  (ICD-780.52) 6)  Uti  (ICD-599.0) 7)  Polyarthralgia  (ICD-719.49) 8)  Lumbar Radiculopathy, Left  (ICD-724.4) 9)  Preventive Health Care  (ICD-V70.0) 10)  Abdominal Pain, Unspecified Site  (ICD-789.00) 11)  Preventive Health Care  (ICD-V70.0) 12)  Family History of Cad Female 1st Degree Relative <60  (ICD-V16.49) 13)  Ischemic Colitis, Hx of  (ICD-V12.79) 14)  Allergic Rhinitis  (ICD-477.9) 15)  Hx of Symptom, Insomnia Nos  (ICD-780.52) 16)  Hyperlipidemia  (ICD-272.4) 17)  Obesity, 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 1 By Mouth Once Daily 2)  Tramadol Hcl 50 Mg Tabs (Tramadol Hcl) .Marland Kitchen.. 1 -2 By Mouth Qid As Needed 3)  Zolpidem Tartrate 10 Mg Tabs (Zolpidem Tartrate) .Marland Kitchen.. 1po At Bedtime As Needed 4)   Bupropion Hcl 150 Mg Xr24h-Tab (Bupropion Hcl) .Marland Kitchen.. 1 By Mouth Once Daily  Current Medications (verified): 1)  Nifediac Cc 30 Mg  Tb24 (Nifedipine) .Marland Kitchen.. 1 By Mouth Once Daily 2)  Tramadol Hcl 50 Mg Tabs (Tramadol Hcl) .Marland Kitchen.. 1 -2 By Mouth Qid As Needed 3)  Zolpidem Tartrate 10 Mg Tabs (Zolpidem Tartrate) .Marland Kitchen.. 1po At Bedtime As Needed 4)  Bupropion Hcl 150 Mg Xr24h-Tab (Bupropion Hcl) .Marland Kitchen.. 1 By Mouth Once Daily 5)  Cephalexin 500 Mg Caps (Cephalexin) .Marland Kitchen.. 1po Three Times A Day 6)  Hydrocodone-Homatropine 5-1.5 Mg/36ml Syrp (Hydrocodone-Homatropine) .Marland Kitchen.. 1 Tsp By Mouth  Q 6 Hrs As Needed  Allergies (verified): No Known Drug Allergies  Past History:  Past Medical History: Last updated: 11/06/2008 Hx of Bartholin Cyst Carpal Tunnel Syndrome Raynaud's syndrome Depression Hypertension Colonic polyps, hx of Mild Obesity Hyperlipidemia insomnia Allergic rhinitis ischemic colitis  Past Surgical History: Last updated: 08/20/2007 Tonsillectomy Bartholin cyst incision and drainage  Social History: Last updated: 10/01/2007 Occupation:legal assistant Former Smoker Alcohol use-yes  Risk Factors: Exercise: yes (10/01/2007)  Risk Factors: Smoking Status: quit (10/01/2007)  Review of Systems       all otherwise negative per pt -   Physical Exam  General:  alert and well-developed.  , mild ill  Head:  normocephalic and atraumatic.   Eyes:  vision grossly  intact, pupils equal, and pupils round.   Ears:  bilat tm's red, sinus nontender Nose:  no external deformity and no nasal discharge.   Mouth:  pharyngeal erythema and fair dentition.   Neck:  supple and no masses.   Lungs:  normal respiratory effort and normal breath sounds.   Heart:  normal rate and regular rhythm.   Extremities:  no edema, no erythema    Impression & Recommendations:  Problem # 1:  BRONCHITIS-ACUTE (ICD-466.0)  Her updated medication list for this problem includes:    Cephalexin 500 Mg Caps  (Cephalexin) .Marland Kitchen... 1po three times a day    Hydrocodone-homatropine 5-1.5 Mg/59ml Syrp (Hydrocodone-homatropine) .Marland Kitchen... 1 tsp by mouth  q 6 hrs as needed treat as above, f/u any worsening signs or symptoms   Problem # 2:  HYPERTENSION (ICD-401.9)  Her updated medication list for this problem includes:    Nifediac Cc 30 Mg Tb24 (Nifedipine) .Marland Kitchen... 1 by mouth once daily  BP today: 122/78 Prior BP: 102/62 (11/07/2009)  Labs Reviewed: K+: 4.2 (10/31/2009) Creat: : 1.0 (10/31/2009)   Chol: 211 (10/31/2009)   HDL: 51.10 (10/31/2009)   LDL: 114 (10/07/2008)   TG: 104.0 (10/31/2009) stable overall by hx and exam, ok to continue meds/tx as is   Complete Medication List: 1)  Nifediac Cc 30 Mg Tb24 (Nifedipine) .Marland Kitchen.. 1 by mouth once daily 2)  Tramadol Hcl 50 Mg Tabs (Tramadol hcl) .Marland Kitchen.. 1 -2 by mouth qid as needed 3)  Zolpidem Tartrate 10 Mg Tabs (Zolpidem tartrate) .Marland Kitchen.. 1po at bedtime as needed 4)  Bupropion Hcl 150 Mg Xr24h-tab (Bupropion hcl) .Marland Kitchen.. 1 by mouth once daily 5)  Cephalexin 500 Mg Caps (Cephalexin) .Marland Kitchen.. 1po three times a day 6)  Hydrocodone-homatropine 5-1.5 Mg/41ml Syrp (Hydrocodone-homatropine) .Marland Kitchen.. 1 tsp by mouth  q 6 hrs as needed  Patient Instructions: 1)  Please take all new medications as prescribed 2)  Continue all previous medications as before this visit  3)  Please schedule a follow-up appointment as needed. Prescriptions: HYDROCODONE-HOMATROPINE 5-1.5 MG/5ML SYRP (HYDROCODONE-HOMATROPINE) 1 tsp by mouth  q 6 hrs as needed  #6 oz x 1   Entered and Authorized by:   Corwin Levins MD   Signed by:   Corwin Levins MD on 02/19/2010   Method used:   Print then Give to Patient   RxID:   [id number] CEPHALEXIN 500 MG CAPS (CEPHALEXIN) 1po three times a day  #30 x 0   Entered and Authorized by:   Corwin Levins MD   Signed by:   Corwin Levins MD on 02/19/2010   Method used:   Print then Give to Patient   RxID:   [id number] NIFEDIAC CC 30 MG  TB24 (NIFEDIPINE) 1 by mouth  once daily  #90 x 3   Entered and Authorized by:   Corwin Levins MD   Signed by:   Corwin Levins MD on 02/19/2010   Method used:   Print then Give to Patient   RxID:   [id number]

## 2011-01-28 NOTE — Progress Notes (Signed)
Summary: referral  Phone Note Call from Patient   Caller: Patient [phone] Summary of Call: Pt called stating that her arm has not improved. Pt is requesting referral to Ortho. Initial call taken by: Margaret Pyle, CMA,  September 04, 2010 2:26 PM  Follow-up for Phone Call        ok - will do Follow-up by: Corwin Levins MD,  September 04, 2010 2:34 PM

## 2011-01-28 NOTE — Assessment & Plan Note (Signed)
Summary: sore throat 53 days, getting worse/john/cd   Vital Signs:  Patient profile:   55 year old female Height:      63 inches (160.02 cm) Weight:      148.6 pounds (67.55 kg) O2 Sat:      98 % on Room air Temp:     98.8 degrees F (37.11 degrees C) oral Pulse rate:   70 / minute BP sitting:   100 / 72  (left arm) Cuff size:   regular  Vitals Entered By: Orlan Leavens (May 11, 2010 2:51 PM)  O2 Flow:  Room air CC: scratchy, sore throat, sinus drainage, URI symptoms Is Patient Diabetic? No Pain Assessment Patient in pain? no        Primary Care Provider:  Corwin Levins MD  CC:  scratchy, sore throat, sinus drainage, and URI symptoms.  History of Present Illness:  URI Symptoms      This is a 55 year old woman who presents with URI symptoms.  The symptoms began 3 days ago.  The severity is described as moderate.  hx similar symptoms 01/2010 -.  The patient reports nasal congestion, purulent nasal discharge, sore throat, dry cough, earache, and sick contacts, but denies productive cough.  Associated symptoms include low-grade fever (<100.5 degrees) and stiff neck.  The patient denies dyspnea and wheezing.  The patient also reports itchy watery eyes, itchy throat, headache, muscle aches, and severe fatigue.  The patient denies sneezing, seasonal symptoms, and response to antihistamine.  The patient denies the following risk factors for Strep sinusitis: tooth pain.    Current Medications (verified): 1)  Nifediac Cc 30 Mg  Tb24 (Nifedipine) .Marland Kitchen.. 1 By Mouth Once Daily 2)  Tramadol Hcl 50 Mg Tabs (Tramadol Hcl) .Marland Kitchen.. 1 -2 By Mouth Qid As Needed 3)  Zolpidem Tartrate 10 Mg Tabs (Zolpidem Tartrate) .Marland Kitchen.. 1po At Bedtime As Needed  Allergies (verified): No Known Drug Allergies  Past History:  Past Medical History: Hx of Bartholin Cyst Carpal Tunnel Syndrome Raynaud's syndrome Depression Hypertension Colonic polyps, hx of   Mild Obesity Hyperlipidemia insomnia Allergic  rhinitis ischemic colitis  Review of Systems  The patient denies fever, decreased hearing, hoarseness, chest pain, abdominal pain, and depression.    Physical Exam  General:  alert and well-developed.  , mild ill  Ears:  initially R TM obscured by cerumen - after irrigation,  TMs clear, without effusion, or cerumen impaction. hearing grossly normal bilaterally  Mouth:  teeth and gums in good repair; mucous membranes moist, without lesions or ulcers. oropharynx clear without exudate, mod erythema. +thick PND Neck:  no masses.   Lungs:  normal respiratory effort, no intercostal retractions or use of accessory muscles; normal breath sounds bilaterally - no crackles and no wheezes.    Heart:  normal rate, regular rhythm, no murmur, and no rub. BLE without edema. normal DP pulses and normal cap refill in all 4 extremities    Msk:  +spasm bilateral trapezius/paracervical region of neck and shoulders Additional Exam:  Procedure: ear irrigation, bilateral Reason: wax impaction Risk/Benefit were discussed with patient who agrees to proceed. each ear was irrigated with warm water. Large amount of wax was removed. Instrumentation with metal ear loop was performed to accomplish the wax removal. Patient tolerated procedure well. Complications: none    Impression & Recommendations:  Problem # 1:  ACUTE PHARYNGITIS (ICD-462)  The following medications were removed from the medication list:    Cephalexin 500 Mg Caps (Cephalexin) .Marland Kitchen... 1po three  times a day Her updated medication list for this problem includes:    Cephalexin 500 Mg Tabs (Cephalexin) .Marland Kitchen... 1 by mouth three times a day x 7 days  Orders: Prescription Created Electronically [phone])  Problem # 2:  CERUMEN IMPACTION (ICD-380.4)  irrigated today  Orders: Cerumen Impaction Removal ([id number])  Complete Medication List: 1)  Nifediac Cc 30 Mg Tb24 (Nifedipine) .Marland Kitchen.. 1 by mouth once daily 2)  Tramadol Hcl 50 Mg Tabs (Tramadol hcl) .Marland Kitchen.. 1  -2 by mouth qid as needed 3)  Zolpidem Tartrate 10 Mg Tabs (Zolpidem tartrate) .Marland Kitchen.. 1po at bedtime as needed 4)  Cephalexin 500 Mg Tabs (Cephalexin) .Marland Kitchen.. 1 by mouth three times a day x 7 days 5)  Flexeril 10 Mg Tabs (Cyclobenzaprine hcl) .Marland Kitchen.. 1 by mouth three times a day as needed for muscle spasm - may cause sedation  Patient Instructions: 1)  it was good to see you today.  2)  antibiotics as discussed; also muscle relaxant for your neck and headache - cephalexin + flexeril - your prescriptions have been electronically submitted to your pharmacy. Please take as directed. Contact our office if you believe you're having problems with the medication(s).  3)  ears irrigated today 4)  Please schedule a follow-up appointment as needed. Prescriptions: FLEXERIL 10 MG TABS (CYCLOBENZAPRINE HCL) 1 by mouth three times a day as needed for muscle spasm - may cause sedation  #30 x 1   Entered and Authorized by:   Newt Lukes MD   Signed by:   Newt Lukes MD on 05/11/2010   Method used:   Electronically to        [facility]  [id number] (retail)       [address]       [place], [place]  [address]       Ph: [phone] or [phone]       Fax: [phone]   RxID:   [id number] CEPHALEXIN 500 MG TABS (CEPHALEXIN) 1 by mouth three times a day x 7 days  #21 x 0   Entered and Authorized by:   Newt Lukes MD   Signed by:   Newt Lukes MD on 05/11/2010   Method used:   Electronically to        [id number] (retail)       [address]       [place], [place]  [address]       Ph: [phone] or [phone]       Fax: [phone]   RxID:   [id number]

## 2011-01-28 NOTE — Progress Notes (Signed)
Summary: Rx refill req  Phone Note Refill Request Message from:  Patient on January 22, 2011 10:34 AM  Refills Requested: Medication #1:  NIFEDIAC CC 30 MG  TB24 1 by mouth once daily   Dosage confirmed as above?Dosage Confirmed   Supply Requested: 1 month  Method Requested: Fax to Local Pharmacy Initial call taken by: Margaret Pyle, CMA,  January 22, 2011 10:34 AM    Prescriptions: NIFEDIAC CC 30 MG  TB24 (NIFEDIPINE) 1 by mouth once daily  #30 x 1   Entered by:   Margaret Pyle, CMA   Authorized by:   Corwin Levins MD   Signed by:   Margaret Pyle, CMA on 01/22/2011   Method used:   Electronically to        [facility] (retail)       [address]       [place], [place]  [id number]       Ph: [phone]       Fax: [phone]   RxID:   [id number]

## 2011-01-28 NOTE — Assessment & Plan Note (Signed)
Summary: MUSCLE INDENTION ON ARM--STC   Vital Signs:  Patient profile:   55 year old female Height:      63 inches Weight:      152 pounds BMI:     27.02 O2 Sat:      99 % on Room air Temp:     96.7 degrees F oral Pulse rate:   71 / minute BP sitting:   110 / 72  (left arm) Cuff size:   regular  Vitals Entered By: Zella Ball Ewing CMA Duncan Dull) (August 21, 2010 2:15 PM)  O2 Flow:  Room air CC: Right shoulder slightly sore, weak and some tension/RE   Primary Care Provider:  Corwin Levins MD  CC:  Right shoulder slightly sore and weak and some tension/RE.  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  No worsening depression, suicidal ideation or panic.  Preventive Screening-Counseling & Management      Drug Use:  no.    Problems Prior to Update: 1)  Shoulder Pain, Right  (ICD-719.41) 2)  Osteopenia  (ICD-733.90) 3)  Vaginitis, Candidal  (ICD-112.1) 4)  Bunion, Left Foot  (ICD-727.1) 5)  Emphysema  (ICD-492.8) 6)  Cough  (ICD-786.2) 7)  Cerumen Impaction  (ICD-380.4) 8)  Acute Pharyngitis  (ICD-462) 9)  Menopausal Disorder  (ICD-627.9) 10)  Preventive Health Care  (ICD-V70.0) 11)  Hot Flashes  (ICD-627.2) 12)  Insomnia-sleep Disorder-unspec  (ICD-780.52) 13)  Uti  (ICD-599.0) 14)  Polyarthralgia  (ICD-719.49) 15)  Lumbar Radiculopathy, Left   (ICD-724.4) 16)  Preventive Health Care  (ICD-V70.0) 17)  Abdominal Pain, Unspecified Site  (ICD-789.00) 18)  Preventive Health Care  (ICD-V70.0) 19)  Family History of Cad Female 1st Degree Relative <60  (ICD-V16.49) 20)  Ischemic Colitis, Hx of  (ICD-V12.79) 21)  Allergic Rhinitis  (ICD-477.9) 22)  Hx of Symptom, Insomnia Nos  (ICD-780.52) 23)  Hyperlipidemia  (ICD-272.4) 24)  Obesity, Mild  (ICD-278.02) 25)  Colonic Polyps, Hx of  (ICD-V12.72) 26)  Hypertension  (ICD-401.9) 27)  Depression  (ICD-311) 28)  Raynaud's Syndrome  (ICD-443.0) 29)  Carpal Tunnel Syndrome  (ICD-354.0) 30)  Bartholin's Cyst  (ICD-616.2)  Medications Prior to Update: 1)  Nifediac Cc 30 Mg  Tb24 (Nifedipine) .Marland Kitchen.. 1 By Mouth Once Daily 2)  Tramadol Hcl 50 Mg Tabs (Tramadol Hcl) .Marland Kitchen.. 1 -2 By Mouth Qid As Needed 3)  Zolpidem Tartrate 10 Mg Tabs (Zolpidem Tartrate) .Marland Kitchen.. 1po At Bedtime As Needed 4)  Flexeril 10 Mg Tabs (Cyclobenzaprine Hcl) .Marland Kitchen.. 1 By Mouth Three Times A Day As Needed For Muscle Spasm - May Cause Sedation 5)  Mytussin Dac 30-10-100 Mg/2ml Soln (Pseudoephedrine-Codeine-Gg) .... 5-10 Ml By Mouth Qid As Needed For Cough 6)  Combivent 18-103 Mcg/act Aero (Ipratropium-Albuterol) .... 2 Puffs Four Times Per Day As Needed 7)  Fluconazole 150  Mg Tabs (Fluconazole) .Marland Kitchen.. 1po Every Other Day As Needed  Current Medications (verified): 1)  Nifediac Cc 30 Mg  Tb24 (Nifedipine) .Marland Kitchen.. 1 By Mouth Once Daily 2)  Tramadol Hcl 50 Mg Tabs (Tramadol Hcl) .Marland Kitchen.. 1 -2 By Mouth Qid As Needed 3)  Zolpidem Tartrate 10 Mg Tabs (Zolpidem Tartrate) .Marland Kitchen.. 1po At Bedtime As Needed 4)  Flexeril 10 Mg Tabs (Cyclobenzaprine Hcl) .Marland Kitchen.. 1 By Mouth Three Times A Day As Needed For Muscle Spasm - May Cause Sedation 5)  Mytussin Dac 30-10-100 Mg/81ml Soln (Pseudoephedrine-Codeine-Gg) .... 5-10 Ml By Mouth Qid As Needed For Cough 6)  Combivent 18-103 Mcg/act Aero (Ipratropium-Albuterol) .... 2 Puffs Four Times Per Day As Needed 7)  Prednisone  10 Mg Tabs (Prednisone) .... 3po Qd For 3days, Then 2po Qd For 3days, Then 1po Qd For 3days, Then Stop  Allergies (verified): No Known Drug Allergies  Past History:  Past Medical History: Last updated: 05/11/2010 Hx of Bartholin Cyst Carpal Tunnel Syndrome Raynaud's syndrome Depression Hypertension Colonic polyps, hx of   Mild Obesity Hyperlipidemia insomnia Allergic rhinitis ischemic colitis  Past Surgical History: Last updated: 08/20/2007 Tonsillectomy Bartholin cyst incision and drainage  Social History: Last updated: 08/21/2010 Occupation:legal assistant Former Smoker Alcohol use-yes Drug use-no 1 daughter  Risk Factors: Alcohol Use: 0 (06/09/2010) Exercise: yes (10/01/2007)  Risk Factors: Smoking Status: quit (06/09/2010)  Social History: Reviewed history from 10/01/2007 and no changes required. Occupation:legal assistant Former Smoker Alcohol use-yes Drug use-no 1 daughterDrug Use:  no  Review of Systems       all otherwise negative per pt -    Physical Exam  General:  alert and well-developed.   Head:  normocephalic and atraumatic.   Eyes:  vision grossly intact, pupils equal, and pupils round.   Ears:  R ear normal and L ear normal.   Nose:  no external deformity and no nasal discharge.   Mouth:  no gingival abnormalities and pharynx pink and moist.   Neck:  supple and no masses.   Lungs:  normal respiratory effort and normal breath sounds.   Heart:  normal rate and regular rhythm.   Msk:  mild tender and swelling noted at the deltoid /humerus insertion sit, but no bursa tenderness;  has mild right trapezoid tender as well;  Neck FROM, NT  and no swelling; right shoudler FROM, NT Extremities:  no edema, no erythema  Neurologic:  strength normal in all extremities, sensation intact to light touch, and gait normal.   Psych:  not anxious appearing and not depressed appearing.     Impression & Recommendations:  Problem # 1:  SHOULDER PAIN,  RIGHT (ICD-719.41)  Her updated medication list for this problem includes:    Tramadol Hcl 50 Mg Tabs (Tramadol hcl) .Marland Kitchen... 1 -2 by mouth qid as needed    Flexeril 10 Mg Tabs (Cyclobenzaprine hcl) .Marland Kitchen... 1 by mouth three times a day as needed for muscle spasm - may cause sedation most c/w deltoid tendonitis, likely due to repetitive work motion; treat as above, f/u any worsening signs or symptoms , as wel as pred pack by mouth;  to consider ortho eval if not improved;  declines arm/shoudler films today; also doubt radicular pain or c-spine problem  Problem # 2:  HYPERTENSION (ICD-401.9)  Her updated medication list for this problem includes:    Nifediac Cc 30 Mg Tb24 (Nifedipine) .Marland Kitchen... 1 by mouth once daily  BP today: 110/72 Prior BP: 114/76 (06/16/2010)  Labs Reviewed: K+: 4.2 (10/31/2009) Creat: :  1.0 (10/31/2009)   Chol: 211 (10/31/2009)   HDL: 51.10 (10/31/2009)   LDL: 114 (10/07/2008)   TG: 104.0 (10/31/2009) stable overall by hx and exam, ok to continue meds/tx as is   Problem # 3:  CARPAL TUNNEL SYNDROME (ICD-354.0) asympt at this time, doubt related to #1, exam bening, reassured  Problem # 4:  DEPRESSION (ICD-311) stable overall by hx and exam, ok to continue meds/tx as is - does not need med at this time  Complete Medication List: 1)  Nifediac Cc 30 Mg Tb24 (Nifedipine) .Marland Kitchen.. 1 by mouth once daily 2)  Tramadol Hcl 50 Mg Tabs (Tramadol hcl) .Marland Kitchen.. 1 -2 by mouth qid as needed 3)  Zolpidem Tartrate 10 Mg Tabs (Zolpidem tartrate) .Marland Kitchen.. 1po at bedtime as needed 4)  Flexeril 10 Mg Tabs (Cyclobenzaprine hcl) .Marland Kitchen.. 1 by mouth three times a day as needed for muscle spasm - may cause sedation 5)  Mytussin Dac 30-10-100 Mg/74ml Soln (Pseudoephedrine-codeine-gg) .... 5-10 ml by mouth qid as needed for cough 6)  Combivent 18-103 Mcg/act Aero (Ipratropium-albuterol) .... 2 puffs four times per day as needed 7)  Prednisone 10 Mg Tabs (Prednisone) .... 3po qd for 3days, then 2po qd for 3days, then  1po qd for 3days, then stop  Patient Instructions: 1)  Please take all new medications as prescribed 2)  Continue all previous medications as before this visit  3)  Please call in about 5 days if you feel need referral to orthopedic 4)  Please schedule a follow-up appointment in 4 months with CPX labs Prescriptions: PREDNISONE 10 MG TABS (PREDNISONE) 3po qd for 3days, then 2po qd for 3days, then 1po qd for 3days, then stop  #18 x 0   Entered and Authorized by:   Corwin Levins MD   Signed by:   Corwin Levins MD on 08/21/2010   Method used:   Print then Give to Patient   RxID:   [id number]

## 2011-05-10 ENCOUNTER — Encounter: Payer: Self-pay | Admitting: Internal Medicine

## 2011-05-10 ENCOUNTER — Ambulatory Visit (INDEPENDENT_AMBULATORY_CARE_PROVIDER_SITE_OTHER): Payer: 59 | Admitting: Internal Medicine

## 2011-05-10 VITALS — BP 110/72 | HR 67 | Temp 98.4°F | Ht 63.0 in

## 2011-05-10 DIAGNOSIS — J029 Acute pharyngitis, unspecified: Secondary | ICD-10-CM

## 2011-05-10 DIAGNOSIS — B009 Herpesviral infection, unspecified: Secondary | ICD-10-CM

## 2011-05-10 DIAGNOSIS — B001 Herpesviral vesicular dermatitis: Secondary | ICD-10-CM

## 2011-05-10 MED ORDER — VALACYCLOVIR HCL 1 G PO TABS: 2000.0000 mg | ORAL_TABLET | Freq: Two times a day (BID) | ORAL | Status: AC

## 2011-05-10 MED ORDER — AZITHROMYCIN 250 MG PO TABS: 250.0000 mg | ORAL_TABLET | Freq: Every day | ORAL | Status: AC

## 2011-05-10 NOTE — Progress Notes (Signed)
  Subjective:    Patient ID: Annette [name], female    DOB: [birth year], 55 y.o.   MRN: [id number]  HPI complains of sore throat symptoms Onset 4-5 day ago Initially associated with with nasal congestion and chest tightness, these now resolved +LGF with chills, esp night - also pain with swallow and new fever blisters in last 3 days (lower right lip) No cough or HA or sinus pressure ST gradually worse despite improvement in other symptoms  +sick contacts with strep  Past Medical History  Diagnosis Date  . HYPERLIPIDEMIA 08/20/2007  . OBESITY, MILD 08/20/2007  . ANXIETY 11/10/2010  . DEPRESSION 08/20/2007  . ATTENTION DEFICIT DISORDER 11/10/2010  . Carpal tunnel syndrome 08/20/2007  . HYPERTENSION 08/20/2007  . Raynaud's syndrome 08/20/2007  . ALLERGIC RHINITIS 10/01/2007  . EMPHYSEMA 06/16/2010  . BARTHOLIN'S CYST 08/20/2007  . HOT FLASHES 09/26/2009  . MENOPAUSAL DISORDER 11/07/2009  . POLYARTHRALGIA 11/06/2008  . OSTEOPENIA 06/16/2010  . Insomnia, unspecified 10/01/2007  . COLONIC POLYPS, HX OF 08/20/2007  . ISCHEMIC COLITIS, HX OF 10/01/2007     Review of Systems  Constitutional: Positive for fatigue.  HENT: Negative for sneezing.   Eyes: Negative for pain.  Respiratory: Negative for cough and shortness of breath.   Genitourinary: Negative for dysuria.       Objective:   Physical Exam BP 110/72  Pulse 67  Temp(Src) 98.4 F (36.9 C) (Oral)  Ht 5\' 3"  (1.6 m)  SpO2 97% Physical Exam  Constitutional: She is oriented to person, place, and time. She appears well-developed and well-nourished. No distress. Tired and mildly ill  HENT:  Cold sores lower right lip without cellulits; OP with erythema, vesicles on soft palpate and white exudate. Eyes: Conjunctivae and EOM are normal. Pupils are equal, round, and reactive to light. No scleral icterus.  Neck: Normal range of motion. Neck supple, mild anterior chain LAD. Cardiovascular: Normal rate, regular rhythm and normal heart  sounds.  No murmur heard. Pulmonary/Chest: Effort normal and breath sounds normal. No respiratory distress. She has no wheezes.  Neurological: She is alert and oriented to person, place, and time. No cranial nerve deficit. Coordination normal.  Psychiatric: She has a normal mood and affect. Her behavior is normal. Judgment and thought content normal.        Assessment & Plan:  Acute pharyngitis, possible strep given exposure - tx Zpack and symptomatic care recommended Cold sores - valtrex for viral infx component, triggered by acute illness/URI

## 2011-05-10 NOTE — Patient Instructions (Signed)
It was good to see you today. Valtrex for cold sore and Zpak for throat - Your prescription(s) have been submitted to your pharmacy. Please take as directed and contact our office if you believe you are having problem(s) with the medication(s). Salt water gargle, tylenol and ibuprofen for ache and pain as discussed

## 2011-11-02 ENCOUNTER — Encounter: Payer: Self-pay | Admitting: Internal Medicine

## 2011-11-04 ENCOUNTER — Other Ambulatory Visit (INDEPENDENT_AMBULATORY_CARE_PROVIDER_SITE_OTHER): Payer: 59

## 2011-11-04 ENCOUNTER — Other Ambulatory Visit: Payer: Self-pay

## 2011-11-04 DIAGNOSIS — Z0000 Encounter for general adult medical examination without abnormal findings: Secondary | ICD-10-CM

## 2011-11-04 LAB — TSH: TSH: 1.59 u[IU]/mL (ref 0.35–5.50)

## 2011-11-04 LAB — CBC WITH DIFFERENTIAL/PLATELET
Basophils Relative: 0.7 % (ref 0.0–3.0)
Eosinophils Absolute: 0.1 10*3/uL (ref 0.0–0.7)
Hemoglobin: 12.5 g/dL (ref 12.0–15.0)
MCHC: 33.3 g/dL (ref 30.0–36.0)
MCV: 86.6 fl (ref 78.0–100.0)
Monocytes Absolute: 0.3 10*3/uL (ref 0.1–1.0)
Neutro Abs: 2.5 10*3/uL (ref 1.4–7.7)
Neutrophils Relative %: 45.9 % (ref 43.0–77.0)
RBC: 4.32 Mil/uL (ref 3.87–5.11)

## 2011-11-04 LAB — LIPID PANEL: VLDL: 18.4 mg/dL (ref 0.0–40.0)

## 2011-11-04 LAB — BASIC METABOLIC PANEL
BUN: 18 mg/dL (ref 6–23)
Creatinine, Ser: 1.1 mg/dL (ref 0.4–1.2)
GFR: 70.47 mL/min (ref >60.00)

## 2011-11-04 LAB — HEPATIC FUNCTION PANEL: Total Bilirubin: 0.5 mg/dL (ref 0.3–1.2)

## 2011-11-04 LAB — URINALYSIS, ROUTINE W REFLEX MICROSCOPIC
Nitrite: NEGATIVE
Specific Gravity, Urine: 1.015 (ref 1.000–1.030)
Total Protein, Urine: NEGATIVE
pH: 5.5 (ref 5.0–8.0)

## 2011-11-06 ENCOUNTER — Encounter: Payer: Self-pay | Admitting: Internal Medicine

## 2011-11-06 DIAGNOSIS — Z0000 Encounter for general adult medical examination without abnormal findings: Secondary | ICD-10-CM | POA: Insufficient documentation

## 2011-11-06 DIAGNOSIS — Z0001 Encounter for general adult medical examination with abnormal findings: Secondary | ICD-10-CM | POA: Insufficient documentation

## 2011-11-12 ENCOUNTER — Ambulatory Visit (INDEPENDENT_AMBULATORY_CARE_PROVIDER_SITE_OTHER): Payer: 59 | Admitting: Internal Medicine

## 2011-11-12 ENCOUNTER — Encounter: Payer: Self-pay | Admitting: Internal Medicine

## 2011-11-12 VITALS — BP 110/70 | HR 55 | Temp 98.5°F | Ht 63.0 in | Wt 161.5 lb

## 2011-11-12 DIAGNOSIS — Z0000 Encounter for general adult medical examination without abnormal findings: Secondary | ICD-10-CM

## 2011-11-12 DIAGNOSIS — I10 Essential (primary) hypertension: Secondary | ICD-10-CM

## 2011-11-12 DIAGNOSIS — B9689 Other specified bacterial agents as the cause of diseases classified elsewhere: Secondary | ICD-10-CM

## 2011-11-12 DIAGNOSIS — E785 Hyperlipidemia, unspecified: Secondary | ICD-10-CM

## 2011-11-12 DIAGNOSIS — A499 Bacterial infection, unspecified: Secondary | ICD-10-CM

## 2011-11-12 DIAGNOSIS — N760 Acute vaginitis: Secondary | ICD-10-CM

## 2011-11-12 MED ORDER — ESTRADIOL 25 MCG VA TABS: 25.0000 ug | ORAL_TABLET | VAGINAL | Status: DC

## 2011-11-12 MED ORDER — ZOLPIDEM TARTRATE 10 MG PO TABS: 10.0000 mg | ORAL_TABLET | Freq: Every evening | ORAL | Status: DC | PRN

## 2011-11-12 MED ORDER — IPRATROPIUM-ALBUTEROL 18-103 MCG/ACT IN AERO: 2.0000 | INHALATION_SPRAY | Freq: Four times a day (QID) | RESPIRATORY_TRACT | Status: DC | PRN

## 2011-11-12 MED ORDER — METRONIDAZOLE 250 MG PO TABS: ORAL_TABLET | ORAL | Status: DC

## 2011-11-12 MED ORDER — NIFEDIPINE ER 30 MG PO TB24: 30.0000 mg | ORAL_TABLET | Freq: Every day | ORAL | Status: DC

## 2011-11-12 MED ORDER — TRAMADOL HCL 50 MG PO TABS: 50.0000 mg | ORAL_TABLET | Freq: Four times a day (QID) | ORAL | Status: DC | PRN

## 2011-11-12 MED ORDER — CYCLOBENZAPRINE HCL 10 MG PO TABS: 10.0000 mg | ORAL_TABLET | Freq: Three times a day (TID) | ORAL | Status: DC | PRN

## 2011-11-12 MED ORDER — ASPIRIN EC 81 MG PO TBEC: 81.0000 mg | DELAYED_RELEASE_TABLET | Freq: Every day | ORAL | Status: AC

## 2011-11-12 NOTE — Assessment & Plan Note (Signed)

## 2011-11-12 NOTE — Assessment & Plan Note (Signed)
Presumed - for flagyl bid for 7 days,  to f/u any worsening symptoms or concerns

## 2011-11-12 NOTE — Patient Instructions (Addendum)
Take all new medications as prescribed - the metronidazole Please follow lower cholesterol diet You will be contacted regarding the referral for: Nutrition Please call your insurance and make nurse appt for shingles shot if covered Please start the Aspirin 81 mg - 1 per day - COATED only Please remember to followup with your GYN for the yearly pap smear and/or mammogram Please return in 1 year for your yearly visit, or sooner if needed, with Lab testing done 3-5 days before

## 2011-11-12 NOTE — Progress Notes (Signed)
Subjective:    Patient ID: Annette [name], female    DOB: Jun 09, 1956, 55 y.o.   MRN: [id number]  HPI  Here for wellness and f/u;  Overall doing ok;  Pt denies CP, worsening SOB, DOE, wheezing, orthopnea, PND, worsening LE edema, palpitations, dizziness or syncope.  Pt denies neurological change such as new Headache, facial or extremity weakness.  Pt denies polydipsia, polyuria, or low sugar symptoms. Pt states overall good compliance with treatment and medications, good tolerability, and trying to follow lower cholesterol diet.  Pt denies worsening depressive symptoms, suicidal ideation or panic. No fever, wt loss, night sweats, loss of appetite, or other constitutional symptoms.  Pt states good ability with ADL's, low fall risk, home safety reviewed and adequate, no significant changes in hearing or vision, and occasionally active with exercise.  No longer taking the lexapro -Denies worsening depressive symptoms, suicidal ideation, or panic, though has ongoing anxiety, not increased recently.   Has had thin type bacterial vaginitis sounding vag d/c mild for 3 mo., no pain. Past Medical History  Diagnosis Date  . HYPERLIPIDEMIA 08/20/2007  . OBESITY, MILD 08/20/2007  . ANXIETY 11/10/2010  . DEPRESSION 08/20/2007  . ATTENTION DEFICIT DISORDER 11/10/2010  . Carpal tunnel syndrome 08/20/2007  . HYPERTENSION 08/20/2007  . Raynaud's syndrome 08/20/2007  . ALLERGIC RHINITIS 10/01/2007  . EMPHYSEMA 06/16/2010  . BARTHOLIN'S CYST 08/20/2007  . HOT FLASHES 09/26/2009  . MENOPAUSAL DISORDER 11/07/2009  . POLYARTHRALGIA 11/06/2008  . OSTEOPENIA 06/16/2010  . Insomnia, unspecified 10/01/2007  . COLONIC POLYPS, HX OF 08/20/2007  . ISCHEMIC COLITIS, HX OF 10/01/2007   Past Surgical History  Procedure Date  . Tonsillectomy     reports that she has quit smoking. She does not have any smokeless tobacco history on file. She reports that she drinks alcohol. She reports that she does not use illicit drugs. family  history includes ADD / ADHD in her daughter; Coronary artery disease in her other; Hypertension in her other; Osteoporosis in her maternal grandmother and mother; and Prostate cancer in her other. No Known Allergies No current outpatient prescriptions on file prior to visit.   Review of Systems Review of Systems  Constitutional: Negative for diaphoresis, activity change, appetite change and unexpected weight change.  HENT: Negative for hearing loss, ear pain, facial swelling, mouth sores and neck stiffness.   Eyes: Negative for pain, redness and visual disturbance.  Respiratory: Negative for shortness of breath and wheezing.   Cardiovascular: Negative for chest pain and palpitations.  Gastrointestinal: Negative for diarrhea, blood in stool, abdominal distention and rectal pain.  Genitourinary: Negative for hematuria, flank pain and decreased urine volume.  Musculoskeletal: Negative for myalgias and joint swelling.  Skin: Negative for color change and wound.  Neurological: Negative for syncope and numbness.  Hematological: Negative for adenopathy.  Psychiatric/Behavioral: Negative for hallucinations, self-injury, decreased concentration and agitation.      Objective:   Physical Exam BP 110/70  Pulse 55  Temp(Src) 98.5 F (36.9 C) (Oral)  Ht 5\' 3"  (1.6 m)  Wt 161 lb 8 oz (73.256 kg)  BMI 28.61 kg/m2  SpO2 97% Physical Exam  VS noted Constitutional: Pt is oriented to person, place, and time. Appears well-developed and well-nourished,mod obese.  HENT:  Head: Normocephalic and atraumatic.  Right Ear: External ear normal.  Left Ear: External ear normal.  Nose: Nose normal.  Mouth/Throat: Oropharynx is clear and moist.  Eyes: Conjunctivae and EOM are normal. Pupils are equal, round, and reactive to light.  Neck: Normal  range of motion. Neck supple. No JVD present. No tracheal deviation present.  Cardiovascular: Normal rate, regular rhythm, normal heart sounds and intact distal  pulses.   Pulmonary/Chest: Effort normal and breath sounds normal.  Abdominal: Soft. Bowel sounds are normal. There is no tenderness.  Musculoskeletal: Normal range of motion. Exhibits no edema.  Lymphadenopathy:  Has no cervical adenopathy.  Neurological: Pt is alert and oriented to person, place, and time. Pt has normal reflexes. No cranial nerve deficit.  Skin: Skin is warm and dry. No rash noted.  Psychiatric:  Has  normal mood and affect. Behavior is normal.  GYN/pelvic:  deferred    Assessment & Plan:

## 2011-11-14 ENCOUNTER — Encounter: Payer: Self-pay | Admitting: Internal Medicine

## 2011-11-14 NOTE — Assessment & Plan Note (Signed)
stable overall by hx and exam, most recent data reviewed with pt, and pt to continue medical treatment as before BP Readings from Last 3 Encounters:  11/12/11 110/70  05/10/11 110/72  11/10/10 100/62   To focus on wt loss, low salt, more excercise

## 2011-11-14 NOTE — Assessment & Plan Note (Signed)
D/w pt - stable overall by hx and exam, most recent data reviewed with pt, and pt to continue medical treatment as before, for lower chol diet, refer nurtrition per pt reqeust  Lab Results  Component Value Date   LDLCALC 114* 11/04/2011

## 2011-11-23 ENCOUNTER — Telehealth: Payer: Self-pay

## 2011-11-23 MED ORDER — TRAMADOL HCL 50 MG PO TABS: 50.0000 mg | ORAL_TABLET | Freq: Four times a day (QID) | ORAL | Status: DC | PRN

## 2011-11-23 MED ORDER — NIFEDIPINE ER 30 MG PO TB24: 30.0000 mg | ORAL_TABLET | Freq: Every day | ORAL | Status: DC

## 2011-11-23 MED ORDER — ZOLPIDEM TARTRATE 10 MG PO TABS: 10.0000 mg | ORAL_TABLET | Freq: Every evening | ORAL | Status: DC | PRN

## 2011-11-23 MED ORDER — IPRATROPIUM-ALBUTEROL 18-103 MCG/ACT IN AERO: 2.0000 | INHALATION_SPRAY | Freq: Four times a day (QID) | RESPIRATORY_TRACT | Status: DC | PRN

## 2011-11-23 MED ORDER — CYCLOBENZAPRINE HCL 10 MG PO TABS: 10.0000 mg | ORAL_TABLET | Freq: Three times a day (TID) | ORAL | Status: DC | PRN

## 2011-11-23 MED ORDER — ESTRADIOL 25 MCG VA TABS: 25.0000 ug | ORAL_TABLET | VAGINAL | Status: DC

## 2011-11-23 NOTE — Telephone Encounter (Signed)
Done hardcopy to robin  

## 2011-11-23 NOTE — Telephone Encounter (Signed)
Pt states she was advised by [facility] that written Rxs are needed. Please contact pt once Rx have been signed.

## 2011-11-24 NOTE — Telephone Encounter (Signed)
Called the patient informed prescriptions requested are ready for pickup at front desk. 

## 2011-12-08 ENCOUNTER — Telehealth: Payer: Self-pay

## 2011-12-08 NOTE — Telephone Encounter (Signed)
Either is fine, whichever is less expensive, covered by insurance, or simply available at that pharmacy

## 2011-12-08 NOTE — Telephone Encounter (Signed)
Pharmacy called stating that Vagifem is only available in 10 mg tablet, please advise on new dosage/directions. Pharmacy also requesting clarification on Nifedipine, should Procardia XL or Adalat CC be dispensed for this pt?

## 2011-12-08 NOTE — Telephone Encounter (Signed)
Pharmacy informed of MD's instructions 

## 2012-02-18 ENCOUNTER — Ambulatory Visit (INDEPENDENT_AMBULATORY_CARE_PROVIDER_SITE_OTHER): Payer: Commercial Managed Care - PPO | Admitting: Internal Medicine

## 2012-02-18 ENCOUNTER — Encounter: Payer: Self-pay | Admitting: Internal Medicine

## 2012-02-18 VITALS — BP 110/78 | HR 70 | Temp 98.4°F | Wt 157.4 lb

## 2012-02-18 DIAGNOSIS — F411 Generalized anxiety disorder: Secondary | ICD-10-CM

## 2012-02-18 DIAGNOSIS — I10 Essential (primary) hypertension: Secondary | ICD-10-CM

## 2012-02-18 DIAGNOSIS — J029 Acute pharyngitis, unspecified: Secondary | ICD-10-CM

## 2012-02-18 MED ORDER — HYDROCODONE-HOMATROPINE 5-1.5 MG/5ML PO SYRP: 5.0000 mL | ORAL_SOLUTION | Freq: Four times a day (QID) | ORAL | Status: AC | PRN

## 2012-02-18 MED ORDER — LEVOFLOXACIN 250 MG PO TABS: 250.0000 mg | ORAL_TABLET | Freq: Every day | ORAL | Status: AC

## 2012-02-18 NOTE — Progress Notes (Signed)
Subjective:    Patient ID: Annette [name], female    DOB: June 23, 1956, 56 y.o.   MRN: [id number]  HPI  Here with severe ST for 2-3 days, with fever, malaise, general weakness, HA, with some pain radiating to right ear as well and nonprod cough, some neck discomfort on right as well.  Pt denies chest pain, increased sob or doe, wheezing, orthopnea, PND, increased LE swelling, palpitations, dizziness or syncope.  Pt denies new neurological symptoms such as new headache, or facial or extremity weakness or numbness.   Pt denies polydipsia, polyuria.  Has no known sick contacts. .Denies worsening depressive symptoms, suicidal ideation, or panic.  Overall good compliance with treatment, and good medicine tolerability.  Past Medical History  Diagnosis Date  . HYPERLIPIDEMIA 08/20/2007  . OBESITY, MILD 08/20/2007  . ANXIETY 11/10/2010  . DEPRESSION 08/20/2007  . ATTENTION DEFICIT DISORDER 11/10/2010  . Carpal tunnel syndrome 08/20/2007  . HYPERTENSION 08/20/2007  . Raynaud's syndrome 08/20/2007  . ALLERGIC RHINITIS 10/01/2007  . EMPHYSEMA 06/16/2010  . BARTHOLIN'S CYST 08/20/2007  . HOT FLASHES 09/26/2009  . MENOPAUSAL DISORDER 11/07/2009  . POLYARTHRALGIA 11/06/2008  . OSTEOPENIA 06/16/2010  . Insomnia, unspecified 10/01/2007  . COLONIC POLYPS, HX OF 08/20/2007  . ISCHEMIC COLITIS, HX OF 10/01/2007   Past Surgical History  Procedure Date  . Tonsillectomy     reports that she has quit smoking. She does not have any smokeless tobacco history on file. She reports that she drinks alcohol. She reports that she does not use illicit drugs. family history includes ADD / ADHD in her daughter; Coronary artery disease in her other; Hypertension in her other; Osteoporosis in her maternal grandmother and mother; and Prostate cancer in her other. No Known Allergies Current Outpatient Prescriptions on File Prior to Visit  Medication Sig Dispense Refill  . albuterol-ipratropium (COMBIVENT) 18-103 MCG/ACT inhaler  Inhale 2 puffs into the lungs 4 (four) times daily as needed.  33 Inhaler  3  . aspirin EC 81 MG tablet Take 1 tablet (81 mg total) by mouth daily.  150 tablet  2  . cyclobenzaprine (FLEXERIL) 10 MG tablet Take 1 tablet (10 mg total) by mouth 3 (three) times daily as needed.  90 tablet  3  . estradiol (VAGIFEM) 25 MCG vaginal tablet Place 1 tablet (25 mcg total) vaginally 2 (two) times a week.  24 tablet  3  . NIFEdipine (PROCARDIA-XL/ADALAT CC) 30 MG 24 hr tablet Take 1 tablet (30 mg total) by mouth daily.  90 tablet  3  . traMADol (ULTRAM) 50 MG tablet Take 1 tablet (50 mg total) by mouth 4 (four) times daily as needed.  60 tablet  2  . zolpidem (AMBIEN) 10 MG tablet Take 1 tablet (10 mg total) by mouth at bedtime as needed.  90 tablet  1   Review of Systems Review of Systems  Constitutional: Negative for diaphoresis and unexpected weight change.  HENT: Negative for drooling and tinnitus.   Eyes: Negative for photophobia and visual disturbance.  Respiratory: Negative for choking and stridor.   Gastrointestinal: Negative for vomiting and blood in stool.  Genitourinary: Negative for hematuria and decreased urine volume.     Objective:   Physical Exam BP 110/78  Pulse 70  Temp(Src) 98.4 F (36.9 C) (Oral)  Wt 157 lb 6 oz (71.385 kg)  SpO2 98% Physical Exam  VS noted Constitutional: Pt appears well-developed and well-nourished.  HENT: Head: Normocephalic.  Right Ear: External ear normal.  Left Ear: External ear  normal.  Bilat tm's mild erythema.  Sinus nontender.  Pharynx severe erythema, mild exudate Eyes: Conjunctivae and EOM are normal. Pupils are equal, round, and reactive to light.  Neck: Normal range of motion. Neck supple.  Cardiovascular: Normal rate and regular rhythm.   Pulmonary/Chest: Effort normal and breath sounds normal.  Neurological: Pt is alert. No cranial nerve deficit.  Skin: Skin is warm. No erythema.  Psychiatric: Pt behavior is normal. Thought content normal.  mild nervous, but no depressed affect    Assessment & Plan:

## 2012-02-18 NOTE — Patient Instructions (Addendum)
Take all new medications as prescribed Continue all other medications as before  

## 2012-02-18 NOTE — Assessment & Plan Note (Signed)
stable overall by hx and exam, most recent data reviewed with pt, and pt to continue medical treatment as before  BP Readings from Last 3 Encounters:  02/18/12 110/78  11/12/11 110/70  05/10/11 110/72

## 2012-02-18 NOTE — Assessment & Plan Note (Signed)
stable overall by hx and exam, and pt to continue medical treatment as before 

## 2012-02-22 ENCOUNTER — Encounter: Payer: Self-pay | Admitting: Internal Medicine

## 2012-04-18 ENCOUNTER — Telehealth: Payer: Self-pay

## 2012-04-18 DIAGNOSIS — R5381 Other malaise: Secondary | ICD-10-CM

## 2012-04-18 NOTE — Telephone Encounter (Signed)
Ok - TSH 780.79  Robin to handle

## 2012-04-18 NOTE — Telephone Encounter (Signed)
Pt called stating she has notice increased fatigue, intolerance of cold, muscle aches and swelling of the face. Pt is requesting TSH testing.

## 2012-04-18 NOTE — Telephone Encounter (Signed)
Ok - TSH 780.79  Robin to handle   

## 2012-04-18 NOTE — Telephone Encounter (Signed)
Called left message to call back. Put order in for TSH lab.

## 2012-04-19 NOTE — Telephone Encounter (Signed)
Called the patient informed to go to the lab.

## 2012-04-20 ENCOUNTER — Encounter: Payer: Self-pay | Admitting: Internal Medicine

## 2012-04-20 ENCOUNTER — Other Ambulatory Visit (INDEPENDENT_AMBULATORY_CARE_PROVIDER_SITE_OTHER): Payer: Commercial Managed Care - PPO

## 2012-04-20 DIAGNOSIS — Z0000 Encounter for general adult medical examination without abnormal findings: Secondary | ICD-10-CM

## 2012-04-20 LAB — HEPATIC FUNCTION PANEL
ALT: 25 U/L (ref 0–35)
AST: 27 U/L (ref 0–37)
Alkaline Phosphatase: 83 U/L (ref 39–117)
Bilirubin, Direct: 0.1 mg/dL (ref 0.0–0.3)
Total Bilirubin: 0.5 mg/dL (ref 0.3–1.2)
Total Protein: 6.7 g/dL (ref 6.0–8.3)

## 2012-04-20 LAB — LIPID PANEL
Cholesterol: 179 mg/dL (ref 0–200)
VLDL: 15.4 mg/dL (ref 0.0–40.0)

## 2012-04-20 LAB — URINALYSIS, ROUTINE W REFLEX MICROSCOPIC
Bilirubin Urine: NEGATIVE
Ketones, ur: NEGATIVE
Urine Glucose: NEGATIVE
pH: 6 (ref 5.0–8.0)

## 2012-04-20 LAB — BASIC METABOLIC PANEL
BUN: 19 mg/dL (ref 6–23)
Creatinine, Ser: 0.9 mg/dL (ref 0.4–1.2)
GFR: 80.92 mL/min (ref >60.00)
Glucose, Bld: 89 mg/dL (ref 70–99)

## 2012-04-20 LAB — CBC WITH DIFFERENTIAL/PLATELET
Basophils Relative: 0.4 % (ref 0.0–3.0)
Eosinophils Relative: 1.9 % (ref 0.0–5.0)
Lymphocytes Relative: 47.1 % — ABNORMAL HIGH (ref 12.0–46.0)
MCV: 86.7 fl (ref 78.0–100.0)
Monocytes Relative: 6.2 % (ref 3.0–12.0)
Neutrophils Relative %: 44.4 % (ref 43.0–77.0)
Platelets: 200 10*3/uL (ref 150.0–400.0)
RBC: 4.47 Mil/uL (ref 3.87–5.11)
WBC: 5.7 10*3/uL (ref 4.5–10.5)

## 2012-04-20 LAB — TSH: TSH: 1.7 u[IU]/mL (ref 0.35–5.50)

## 2012-09-12 ENCOUNTER — Telehealth: Payer: Self-pay

## 2012-09-12 DIAGNOSIS — Z0000 Encounter for general adult medical examination without abnormal findings: Secondary | ICD-10-CM

## 2012-09-12 NOTE — Telephone Encounter (Signed)
Put order in for lab. 

## 2012-11-15 ENCOUNTER — Other Ambulatory Visit (INDEPENDENT_AMBULATORY_CARE_PROVIDER_SITE_OTHER): Payer: Commercial Managed Care - PPO

## 2012-11-15 DIAGNOSIS — Z0000 Encounter for general adult medical examination without abnormal findings: Secondary | ICD-10-CM

## 2012-11-15 LAB — HEPATIC FUNCTION PANEL
ALT: 15 U/L (ref 0–35)
AST: 22 U/L (ref 0–37)
Albumin: 3.7 g/dL (ref 3.5–5.2)
Alkaline Phosphatase: 67 U/L (ref 39–117)
Bilirubin, Direct: 0.1 mg/dL (ref 0.0–0.3)
Total Bilirubin: 0.6 mg/dL (ref 0.3–1.2)
Total Protein: 6.7 g/dL (ref 6.0–8.3)

## 2012-11-15 LAB — CBC WITH DIFFERENTIAL/PLATELET
Basophils Absolute: 0 10*3/uL (ref 0.0–0.1)
Basophils Relative: 0.5 % (ref 0.0–3.0)
Eosinophils Absolute: 0.1 10*3/uL (ref 0.0–0.7)
HCT: 36.6 % (ref 36.0–46.0)
Hemoglobin: 11.9 g/dL — ABNORMAL LOW (ref 12.0–15.0)
Lymphocytes Relative: 46.7 % — ABNORMAL HIGH (ref 12.0–46.0)
Lymphs Abs: 2.8 10*3/uL (ref 0.7–4.0)
MCHC: 32.5 g/dL (ref 30.0–36.0)
MCV: 86.3 fl (ref 78.0–100.0)
Monocytes Absolute: 0.3 10*3/uL (ref 0.1–1.0)
Neutro Abs: 2.7 10*3/uL (ref 1.4–7.7)
RBC: 4.24 Mil/uL (ref 3.87–5.11)
RDW: 14.5 % (ref 11.5–14.6)

## 2012-11-15 LAB — URINALYSIS, ROUTINE W REFLEX MICROSCOPIC
Bilirubin Urine: NEGATIVE
Nitrite: NEGATIVE
pH: 5.5 (ref 5.0–8.0)

## 2012-11-15 LAB — IBC PANEL
Iron: 40 ug/dL — ABNORMAL LOW (ref 42–145)
Saturation Ratios: 11.2 % — ABNORMAL LOW (ref 20.0–50.0)
Transferrin: 254.3 mg/dL (ref 212.0–360.0)

## 2012-11-15 LAB — BASIC METABOLIC PANEL
BUN: 18 mg/dL (ref 6–23)
Calcium: 9.4 mg/dL (ref 8.4–10.5)
Chloride: 110 mEq/L (ref 96–112)
Creatinine, Ser: 1 mg/dL (ref 0.4–1.2)

## 2012-11-15 LAB — LIPID PANEL
Cholesterol: 187 mg/dL (ref 0–200)
HDL: 55.2 mg/dL (ref >39.00)
LDL Cholesterol: 117 mg/dL — ABNORMAL HIGH (ref 0–99)
Total CHOL/HDL Ratio: 3
Triglycerides: 76 mg/dL (ref 0.0–149.0)
VLDL: 15.2 mg/dL (ref 0.0–40.0)

## 2012-11-15 LAB — TSH: TSH: 1.43 u[IU]/mL (ref 0.35–5.50)

## 2012-11-20 ENCOUNTER — Ambulatory Visit (INDEPENDENT_AMBULATORY_CARE_PROVIDER_SITE_OTHER): Payer: Commercial Managed Care - PPO | Admitting: Internal Medicine

## 2012-11-20 ENCOUNTER — Encounter: Payer: Self-pay | Admitting: Internal Medicine

## 2012-11-20 VITALS — BP 100/68 | HR 73 | Temp 98.4°F | Ht 63.0 in | Wt 154.1 lb

## 2012-11-20 DIAGNOSIS — H9190 Unspecified hearing loss, unspecified ear: Secondary | ICD-10-CM

## 2012-11-20 DIAGNOSIS — I7300 Raynaud's syndrome without gangrene: Secondary | ICD-10-CM | POA: Insufficient documentation

## 2012-11-20 DIAGNOSIS — D509 Iron deficiency anemia, unspecified: Secondary | ICD-10-CM | POA: Insufficient documentation

## 2012-11-20 DIAGNOSIS — H9191 Unspecified hearing loss, right ear: Secondary | ICD-10-CM | POA: Insufficient documentation

## 2012-11-20 DIAGNOSIS — Z0000 Encounter for general adult medical examination without abnormal findings: Secondary | ICD-10-CM

## 2012-11-20 MED ORDER — ESTRADIOL 25 MCG VA TABS: 25.0000 ug | ORAL_TABLET | VAGINAL | Status: DC

## 2012-11-20 MED ORDER — TRAMADOL HCL 50 MG PO TABS: 50.0000 mg | ORAL_TABLET | Freq: Four times a day (QID) | ORAL | Status: DC | PRN

## 2012-11-20 MED ORDER — ZOLPIDEM TARTRATE 10 MG PO TABS: 10.0000 mg | ORAL_TABLET | Freq: Every evening | ORAL | Status: DC | PRN

## 2012-11-20 NOTE — Assessment & Plan Note (Signed)

## 2012-11-20 NOTE — Progress Notes (Signed)
Subjective:    Patient ID: Annette [name], female    DOB: 1956-02-07, 56 y.o.   MRN: [id number]  HPI Here for wellness and f/u;  Overall doing ok;  Pt denies CP, worsening SOB, DOE, wheezing, orthopnea, PND, worsening LE edema, palpitations, dizziness or syncope.  Pt denies neurological change such as new Headache, facial or extremity weakness.  Pt denies polydipsia, polyuria, or low sugar symptoms. Pt states overall good compliance with treatment and medications, good tolerability, and trying to follow lower cholesterol diet.  Pt denies worsening depressive symptoms, suicidal ideation or panic. No fever, wt loss, night sweats, loss of appetite, or other constitutional symptoms.  Pt states good ability with ADL's, low fall risk, home safety reviewed and adequate, no significant changes in hearing or vision, and occasionally active with exercise.  LMP x 3 yrs, no recent overt bleeding or bruising.  Also mentions feeling a sense of warmth/burning to feet at night.  Has some recent fatigue and weakness, seems worse with taking the procardia.  Also mentions signficant dysparuenia for which she plans to see GYN.  Has some right hearing loss for the past wk as well - ? wax Past Medical History  Diagnosis Date  . HYPERLIPIDEMIA 08/20/2007  . OBESITY, MILD 08/20/2007  . ANXIETY 11/10/2010  . DEPRESSION 08/20/2007  . ATTENTION DEFICIT DISORDER 11/10/2010  . Carpal tunnel syndrome 08/20/2007  . HYPERTENSION 08/20/2007  . Raynaud's syndrome 08/20/2007  . ALLERGIC RHINITIS 10/01/2007  . EMPHYSEMA 06/16/2010  . BARTHOLIN'S CYST 08/20/2007  . HOT FLASHES 09/26/2009  . MENOPAUSAL DISORDER 11/07/2009  . POLYARTHRALGIA 11/06/2008  . OSTEOPENIA 06/16/2010  . Insomnia, unspecified 10/01/2007  . COLONIC POLYPS, HX OF 08/20/2007  . ISCHEMIC COLITIS, HX OF 10/01/2007   Past Surgical History  Procedure Date  . Tonsillectomy     reports that she has quit smoking. She does not have any smokeless tobacco history on file. She  reports that she drinks alcohol. She reports that she does not use illicit drugs. family history includes ADD / ADHD in her daughter; Coronary artery disease in her other; Hypertension in her other; Osteoporosis in her maternal grandmother and mother; and Prostate cancer in her other. No Known Allergies Current Outpatient Prescriptions on File Prior to Visit  Medication Sig Dispense Refill  . albuterol-ipratropium (COMBIVENT) 18-103 MCG/ACT inhaler Inhale 2 puffs into the lungs 4 (four) times daily as needed.  33 Inhaler  3  . [DISCONTINUED] zolpidem (AMBIEN) 10 MG tablet Take 1 tablet (10 mg total) by mouth at bedtime as needed.  90 tablet  1  . cyclobenzaprine (FLEXERIL) 10 MG tablet Take 1 tablet (10 mg total) by mouth 3 (three) times daily as needed.  90 tablet  3   Review of Systems Review of Systems  Constitutional: Negative for diaphoresis, activity change, appetite change and unexpected weight change.  HENT: Negative for hearing loss, ear pain, facial swelling, mouth sores and neck stiffness.   Eyes: Negative for pain, redness and visual disturbance.  Respiratory: Negative for shortness of breath and wheezing.   Cardiovascular: Negative for chest pain and palpitations.  Gastrointestinal: Negative for diarrhea, blood in stool, abdominal distention and rectal pain.  Genitourinary: Negative for hematuria, flank pain and decreased urine volume.  Musculoskeletal: Negative for myalgias and joint swelling.  Skin: Negative for color change and wound.  Neurological: Negative for syncope and numbness.  Hematological: Negative for adenopathy.  Psychiatric/Behavioral: Negative for hallucinations, self-injury, decreased concentration and agitation.      Objective:  Physical Exam BP 100/68  Pulse 73  Temp 98.4 F (36.9 C) (Oral)  Ht 5\' 3"  (1.6 m)  Wt 154 lb 2 oz (69.911 kg)  BMI 27.30 kg/m2  SpO2 97% Physical Exam  VS noted Constitutional: Pt is oriented to person, place, and time.  Appears well-developed and well-nourished.  HENT:  Head: Normocephalic and atraumatic.  Right Ear: External ear normal.  Left Ear: External ear normal.  Nose: Nose normal.  Mouth/Throat: Oropharynx is clear and moist.  Eyes: Conjunctivae and EOM are normal. Pupils are equal, round, and reactive to light.  Neck: Normal range of motion. Neck supple. No JVD present. No tracheal deviation present.  Cardiovascular: Normal rate, regular rhythm, normal heart sounds and intact distal pulses.   Pulmonary/Chest: Effort normal and breath sounds normal.  Abdominal: Soft. Bowel sounds are normal. There is no tenderness.  Musculoskeletal: Normal range of motion. Exhibits no edema.  Lymphadenopathy:  Has no cervical adenopathy.  Neurological: Pt is alert and oriented to person, place, and time. Pt has normal reflexes. No cranial nerve deficit. Motor/dtr/sens intact Skin: Skin is warm and dry. No rash noted.  Psychiatric:  Has  normal mood and affect. Behavior is normal.     Assessment & Plan:

## 2012-11-20 NOTE — Assessment & Plan Note (Signed)
Improved with irrigation 

## 2012-11-20 NOTE — Patient Instructions (Addendum)
Your right ear was irrigated today Continue all other medications as before, except ok to try stopping the procardia for now Your refills were done today as requested Please continue your efforts at being more active, low cholesterol diet, and weight control. Thank you for enrolling in MyChart. Please follow the instructions below to securely access your online medical record. MyChart allows you to send messages to your doctor, view your test results, renew your prescriptions, schedule appointments, and more. To Log into MyChart, please go to [email or web address], and your Username is:  [email or web address]  (pass:  [email or web address]) You will be contacted regarding the referral for: Gastroenterology Please see your GYN for further issues with pain Please return in 1 year for your yearly visit, or sooner if needed, with Lab testing done 3-5 days before

## 2012-11-20 NOTE — Assessment & Plan Note (Addendum)
Very mild but post menopausal, no obvious source, for GI referral, pt declining to take iron for now

## 2012-11-20 NOTE — Assessment & Plan Note (Signed)
Ok to try off the procardia for now given the recent fatigue,  to f/u any worsening symptoms or concerns

## 2012-12-14 ENCOUNTER — Encounter: Payer: Self-pay | Admitting: Internal Medicine

## 2013-01-05 ENCOUNTER — Ambulatory Visit: Payer: Commercial Managed Care - PPO | Admitting: Internal Medicine

## 2013-02-05 ENCOUNTER — Telehealth: Payer: Self-pay

## 2013-02-05 MED ORDER — ESTRADIOL 10 MCG VA TABS: ORAL_TABLET | VAGINAL | Status: DC

## 2013-02-05 NOTE — Telephone Encounter (Signed)
Fax from pharmacy to inform that vagifem vaginal tab 25 mcg are discontinued. They are suggesting as an alternative Vagifem Vaginal tabs 10 mcg.  Advise please

## 2013-02-05 NOTE — Telephone Encounter (Signed)
rx changed as requested.

## 2013-02-10 ENCOUNTER — Other Ambulatory Visit: Payer: Self-pay

## 2013-02-15 ENCOUNTER — Encounter: Payer: Self-pay | Admitting: Internal Medicine

## 2013-02-16 ENCOUNTER — Ambulatory Visit (INDEPENDENT_AMBULATORY_CARE_PROVIDER_SITE_OTHER): Payer: Commercial Managed Care - PPO | Admitting: Internal Medicine

## 2013-02-16 ENCOUNTER — Other Ambulatory Visit (INDEPENDENT_AMBULATORY_CARE_PROVIDER_SITE_OTHER): Payer: Commercial Managed Care - PPO

## 2013-02-16 ENCOUNTER — Other Ambulatory Visit: Payer: Self-pay | Admitting: Gastroenterology

## 2013-02-16 ENCOUNTER — Encounter: Payer: Self-pay | Admitting: Internal Medicine

## 2013-02-16 VITALS — BP 98/60 | HR 54 | Ht 63.0 in | Wt 158.2 lb

## 2013-02-16 DIAGNOSIS — Z860101 Personal history of adenomatous and serrated colon polyps: Secondary | ICD-10-CM

## 2013-02-16 DIAGNOSIS — Z860100 Personal history of colon polyps, unspecified: Secondary | ICD-10-CM

## 2013-02-16 DIAGNOSIS — D509 Iron deficiency anemia, unspecified: Secondary | ICD-10-CM

## 2013-02-16 LAB — IBC PANEL
Saturation Ratios: 19.8 % — ABNORMAL LOW (ref 20.0–50.0)
Transferrin: 255.5 mg/dL (ref 212.0–360.0)

## 2013-02-16 LAB — CBC
MCV: 84.5 fl (ref 78.0–100.0)
Platelets: 211 10*3/uL (ref 150.0–400.0)
RBC: 4.62 Mil/uL (ref 3.87–5.11)

## 2013-02-16 LAB — IGA: IgA: 134 mg/dL (ref 68–378)

## 2013-02-16 MED ORDER — PEG-KCL-NACL-NASULF-NA ASC-C 100 G PO SOLR: 1.0000 | Freq: Once | ORAL | Status: DC

## 2013-02-16 NOTE — Progress Notes (Signed)
Patient ID: Annette [name], female   DOB: Jun 07, 1956, 56 y.o.   MRN: [id number]  SUBJECTIVE: HPI Annette [name] is a 57 year old female with a past medical history of adenomatous colon polyps, an isolated episode of ischemic colitis in 2004, hypertension, hyperlipidemia who seen in consultation at the request of Dr. Jonny Ruiz for evaluation of mild iron deficiency anemia. The patient is alone today. She was previously known to Dr. Corinda Gubler and had a colonoscopy last in 2007. During her health maintenance exam with her PCP, lab work revealed a new mild anemia. She is no longer menstruating, with her last mental period being greater than 2 years ago. She does report several isolated episodes of scant blood per rectum. She also has had several isolated episodes of loose stools, which occurred after working out on a treadmill. It was after his bowel movements that she saw scant red blood. Several times she's had some mild fecal seepage. Otherwise her bowel movements have been regular occurring once daily with formed brown stool. No other bleeding such as hematuria, epistaxis, or gingival bleeding. No easy bruising. She's noticed some mild sensitivity to the right of her umbilicus with toweling off after bathing.  No abdominal pain, nausea or vomiting. Good appetite. Stable weight. She has a history of Raynaud's phenomenon, but this seems to have resolved and she has even stopped her Procardia  Review of Systems  As per history of present illness, otherwise negative   Past Medical History  Diagnosis Date  . HYPERLIPIDEMIA 08/20/2007  . OBESITY, MILD 08/20/2007  . ANXIETY 11/10/2010  . DEPRESSION 08/20/2007  . ATTENTION DEFICIT DISORDER 11/10/2010  . Carpal tunnel syndrome 08/20/2007  . HYPERTENSION 08/20/2007  . Raynaud's syndrome 08/20/2007  . ALLERGIC RHINITIS 10/01/2007  . EMPHYSEMA 06/16/2010  . BARTHOLIN'S CYST 08/20/2007  . HOT FLASHES 09/26/2009  . MENOPAUSAL DISORDER 11/07/2009  . POLYARTHRALGIA 11/06/2008   . OSTEOPENIA 06/16/2010  . Insomnia, unspecified 10/01/2007  . COLONIC POLYPS, HX OF 08/20/2007  . ISCHEMIC COLITIS, HX OF 10/01/2007    Current Outpatient Prescriptions  Medication Sig Dispense Refill  . Estradiol 10 MCG TABS 1 tabs as directed twice per week  8 tablet  11  . traMADol (ULTRAM) 50 MG tablet Take 1 tablet (50 mg total) by mouth 4 (four) times daily as needed.  60 tablet  2  . zolpidem (AMBIEN) 10 MG tablet Take 1 tablet (10 mg total) by mouth at bedtime as needed.  90 tablet  1  . cyclobenzaprine (FLEXERIL) 10 MG tablet Take 1 tablet (10 mg total) by mouth 3 (three) times daily as needed.  90 tablet  3  . peg 3350 powder (MOVIPREP) 100 G SOLR Take 1 kit (100 g total) by mouth once.  1 kit  0   No current facility-administered medications for this visit.    No Known Allergies  Family History  Problem Relation Age of Onset  . Osteoporosis Mother   . ADD / ADHD Daughter   . Osteoporosis Maternal Grandmother   . Coronary artery disease Other   . Hypertension Other   . Prostate cancer Other    negative for celiac disease. She is of [place] origin  History  Substance Use Topics  . Smoking status: Former Smoker    Quit date: 12/27/1990  . Smokeless tobacco: Never Used  . Alcohol Use: Yes     Comment: one drink per day    OBJECTIVE: BP 98/60  Pulse 54  Ht 5\' 3"  (1.6 m)  Wt  158 lb 3.2 oz (71.759 kg)  BMI 28.03 kg/m2 Constitutional: Well-developed and well-nourished. No distress. HEENT: Normocephalic and atraumatic. Oropharynx is clear and moist. No oropharyngeal exudate. Conjunctivae are normal. No scleral icterus. Neck: Neck supple. Trachea midline. Cardiovascular: Normal rate, regular rhythm and intact distal pulses. No M/R/G Pulmonary/chest: Effort normal and breath sounds normal. No wheezing, rales or rhonchi. Abdominal: Soft, nontender to deep palpation, mild tenderness with Carnett's sign, nondistended. Bowel sounds active throughout. There are no  masses palpable. No hepatosplenomegaly. Extremities: no clubbing, cyanosis, or edema Lymphadenopathy: No cervical adenopathy noted. Neurological: Alert and oriented to person place and time. Skin: Skin is warm and dry. No rashes noted. Psychiatric: Normal mood and affect. Behavior is normal.  Labs  CBC    Component Value Date/Time   WBC 5.9 11/15/2012 0832   RBC 4.24 11/15/2012 0832   HGB 11.9* 11/15/2012 0832   HCT 36.6 11/15/2012 0832   PLT 259.0 11/15/2012 0832   MCV 86.3 11/15/2012 0832   MCHC 32.5 11/15/2012 0832   RDW 14.5 11/15/2012 0832   LYMPHSABS 2.8 11/15/2012 0832   MONOABS 0.3 11/15/2012 0832   EOSABS 0.1 11/15/2012 0832   BASOSABS 0.0 11/15/2012 0832    CMP     Component Value Date/Time   NA 141 11/15/2012 0832   K 4.5 11/15/2012 0832   CL 110 11/15/2012 0832   CO2 27 11/15/2012 0832   GLUCOSE 92 11/15/2012 0832   BUN 18 11/15/2012 0832   CREATININE 1.0 11/15/2012 0832   CALCIUM 9.4 11/15/2012 0832   PROT 6.7 11/15/2012 0832   ALBUMIN 3.7 11/15/2012 0832   AST 22 11/15/2012 0832   ALT 15 11/15/2012 0832   ALKPHOS 67 11/15/2012 0832   BILITOT 0.6 11/15/2012 0832   GFRNONAA 78.45 11/04/2010 0820   GFRAA 75 10/07/2008 0857    Iron/TIBC/Ferritin    Component Value Date/Time   IRON 40* 11/15/2012 0832     ASSESSMENT AND PLAN: 57 year old female with a past medical history of adenomatous colon polyps, an isolated episode of ischemic colitis in 2004, hypertension, hyperlipidemia who seen in consultation at the request of Dr. Jonny Ruiz for evaluation of mild iron deficiency anemia.   1.  Mild anemia, normocytic with borderline low iron/hx of adenomatous colon polyps last colonoscopy 2007 -- I recommended colonoscopy for her today both given her new mild anemia and also her history of adenomatous colon polyps. Her last colonoscopy was in 2007, and guidelines support surveillance interval to every 5 years (therefore making her due at present).  We discussed this  test including the risks and benefits and she is agreeable to proceed. I would like to repeat a CBC and iron studies today including ferritin. We discussed that if she is indeed iron deficient at this time, upper endoscopy would normally be part of the workup. If she is found to be iron deficient, I will recommend EGD, but we'll discuss this with her further before scheduling.  She may in fact need iron supplementation, but she is worried about iron supplementation due to constipation. We discussed possible iron infusion if she is opposed to oral supplements, but again we will need to discuss this further. She indicated she would likely prefer to change her diet to focus on iron rich foods before taking on any oral or IV supplement.  I will also check a celiac panel today.

## 2013-02-16 NOTE — Patient Instructions (Addendum)
You have been scheduled for a colonoscopy 03/05/2013 with propofol. Please follow written instructions given to you at your visit today.  Please pick up your prep kit at the pharmacy within the next 1-3 days. If you use inhalers (even only as needed) or a CPAP machine, please bring them with you on the day of your procedure.   Your physician has requested that you go to the basement for lab work before leaving today.

## 2013-02-19 LAB — TISSUE TRANSGLUTAMINASE, IGA: Tissue Transglutaminase Ab, IgA: 4.5 U/mL (ref <20)

## 2013-02-20 ENCOUNTER — Telehealth: Payer: Self-pay

## 2013-02-20 MED ORDER — ESTRADIOL 1 MG PO TABS: 1.0000 mg | ORAL_TABLET | Freq: Every day | ORAL | Status: DC

## 2013-02-20 NOTE — Telephone Encounter (Signed)
Ok to change to the lower dose daily, instead of the high dose twice [employer]

## 2013-02-20 NOTE — Telephone Encounter (Signed)
The patient called to inform the estradiol is no longer available and she would need an alternative

## 2013-02-20 NOTE — Telephone Encounter (Signed)
Patient informed. 

## 2013-02-21 ENCOUNTER — Encounter: Payer: Self-pay | Admitting: Internal Medicine

## 2013-02-21 ENCOUNTER — Encounter: Payer: Commercial Managed Care - PPO | Admitting: Internal Medicine

## 2013-03-05 ENCOUNTER — Ambulatory Visit (AMBULATORY_SURGERY_CENTER): Payer: Commercial Managed Care - PPO | Admitting: Internal Medicine

## 2013-03-05 ENCOUNTER — Encounter: Payer: Self-pay | Admitting: Internal Medicine

## 2013-03-05 ENCOUNTER — Encounter: Payer: Commercial Managed Care - PPO | Admitting: Internal Medicine

## 2013-03-05 VITALS — BP 108/42 | HR 53 | Temp 98.3°F | Resp 14 | Ht 63.0 in | Wt 158.0 lb

## 2013-03-05 DIAGNOSIS — Z860100 Personal history of colon polyps, unspecified: Secondary | ICD-10-CM

## 2013-03-05 DIAGNOSIS — Z1211 Encounter for screening for malignant neoplasm of colon: Secondary | ICD-10-CM

## 2013-03-05 DIAGNOSIS — D509 Iron deficiency anemia, unspecified: Secondary | ICD-10-CM

## 2013-03-05 MED ORDER — SODIUM CHLORIDE 0.9 % IV SOLN: 500.0000 mL | INTRAVENOUS | Status: DC

## 2013-03-05 NOTE — Patient Instructions (Addendum)
YOU HAD AN ENDOSCOPIC PROCEDURE TODAY AT THE [facility]: Refer to the procedure report that was given to you for any specific questions about what was found during the examination.  If the procedure report does not answer your questions, please call your gastroenterologist to clarify.  If you requested that your care partner not be given the details of your procedure findings, then the procedure report has been included in a sealed envelope for you to review at your convenience later.  YOU SHOULD EXPECT: Some feelings of bloating in the abdomen. Passage of more gas than usual.  Walking can help get rid of the air that was put into your GI tract during the procedure and reduce the bloating. If you had a lower endoscopy (such as a colonoscopy or flexible sigmoidoscopy) you may notice spotting of blood in your stool or on the toilet paper. If you underwent a bowel prep for your procedure, then you may not have a normal bowel movement for a few days.  DIET: Your first meal following the procedure should be a light meal and then it is ok to progress to your normal diet.  A half-sandwich or bowl of soup is an example of a good first meal.  Heavy or fried foods are harder to digest and may make you feel nauseous or bloated.  Likewise meals heavy in dairy and vegetables can cause extra gas to form and this can also increase the bloating.  Drink plenty of fluids but you should avoid alcoholic beverages for 24 hours.  ACTIVITY: Your care partner should take you home directly after the procedure.  You should plan to take it easy, moving slowly for the rest of the day.  You can resume normal activity the day after the procedure however you should NOT DRIVE or use heavy machinery for 24 hours (because of the sedation medicines used during the test).    SYMPTOMS TO REPORT IMMEDIATELY: A gastroenterologist can be reached at any hour.  During normal business hours, 8:30 AM to 5:00 PM Monday through Friday,  call [phone].  After hours and on weekends, please call the GI answering service at [phone] who will take a message and have the physician on call contact you.   Following lower endoscopy (colonoscopy or flexible sigmoidoscopy):  Excessive amounts of blood in the stool  Significant tenderness or worsening of abdominal pains  Swelling of the abdomen that is new, acute  Fever of 100F or higher  FOLLOW UP: If any biopsies were taken you will be contacted by phone or by letter within the next 1-3 weeks.  Call your gastroenterologist if you have not heard about the biopsies in 3 weeks.  Our staff will call the home number listed on your records the next business day following your procedure to check on you and address any questions or concerns that you may have at that time regarding the information given to you following your procedure. This is a courtesy call and so if there is no answer at the home number and we have not heard from you through the emergency physician on call, we will assume that you have returned to your regular daily activities without incident.  SIGNATURES/CONFIDENTIALITY: You and/or your care partner have signed paperwork which will be entered into your electronic medical record.  These signatures attest to the fact that that the information above on your After Visit Summary has been reviewed and is understood.  Full responsibility of the confidentiality of this   discharge information lies with you and/or your care-partner.  Repeat colonoscopy in 5 years 

## 2013-03-05 NOTE — Op Note (Signed)
[facility] [address] [place], [address]   COLONOSCOPY PROCEDURE REPORT  PATIENT: Annette, [name]  MR#: [id number] BIRTHDATE: 08/14/1956 , 56  yrs. old GENDER: Female ENDOSCOPIST: Beverley Fiedler, MD REFERRED XL:KGMW, Fayrene Fearing PROCEDURE DATE:  03/05/2013 PROCEDURE:   Colonoscopy, screening ASA CLASS:   Class II INDICATIONS:elevated risk screening and Last colonoscopy performed 2007. MEDICATIONS: MAC sedation, administered by CRNA and propofol (Diprivan) 200mg  IV  DESCRIPTION OF PROCEDURE:   After the risks benefits and alternatives of the procedure were thoroughly explained, informed consent was obtained.  A digital rectal exam revealed no rectal mass.   The LB [id number]  endoscope was introduced through the anus and advanced to the cecum, which was identified by both the appendix and ileocecal valve. No adverse events experienced. The quality of the prep was good, using MoviPrep  The instrument was then slowly withdrawn as the colon was fully examined.      COLON FINDINGS: A normal appearing cecum, ileocecal valve, and appendiceal orifice were identified.  The ascending, hepatic flexure, transverse, splenic flexure, descending, sigmoid colon and rectum appeared unremarkable.  No polyps or cancers were seen. Retroflexed views revealed no abnormalities. The time to cecum=3 minutes 58 seconds.  Withdrawal time=7 minutes 10 seconds.  The scope was withdrawn and the procedure completed.  COMPLICATIONS: There were no complications.  ENDOSCOPIC IMPRESSION: Normal colon  RECOMMENDATIONS: Given your personal history of adenomatous (pre-cancerous) polyps, you will need a repeat colonoscopy in 5 years.   eSigned:  Beverley Fiedler, MD 03/05/2013 3:22 PM   cc: The Patient

## 2013-03-05 NOTE — Progress Notes (Signed)
Patient did not experience any of the following events: a burn prior to discharge; a fall within the facility; wrong site/side/patient/procedure/implant event; or a hospital transfer or hospital admission upon discharge from the facility. (G8907) Patient did not have preoperative order for IV antibiotic SSI prophylaxis. (G8918)  

## 2013-03-06 ENCOUNTER — Telehealth: Payer: Self-pay | Admitting: *Deleted

## 2013-03-06 NOTE — Telephone Encounter (Signed)
  Follow up Call-  Call back number 03/05/2013  Post procedure Call Back phone  # [phone]  Permission to leave phone message Yes     Patient questions:  Do you have a fever, pain , or abdominal swelling? no Pain Score  0 *  Have you tolerated food without any problems? yes  Have you been able to return to your normal activities? yes  Do you have any questions about your discharge instructions: Diet   no Medications  no Follow up visit  no  Do you have questions or concerns about your Care? no  Actions: * If pain score is 4 or above: No action needed, pain <4.

## 2013-05-25 ENCOUNTER — Telehealth: Payer: Self-pay

## 2013-05-25 DIAGNOSIS — M25511 Pain in right shoulder: Secondary | ICD-10-CM

## 2013-05-25 NOTE — Telephone Encounter (Signed)
I have referred, urgent, to Dr Ranell Patrick who is very good with shoulders

## 2013-05-25 NOTE — Telephone Encounter (Signed)
The patient has had right shoulder pain for 3 to 4 days and is getting no better.  She would like a referral to sports medicine.  Please advise

## 2013-09-09 LAB — HM MAMMOGRAPHY

## 2013-09-13 ENCOUNTER — Encounter: Payer: Self-pay | Admitting: Internal Medicine

## 2013-11-01 ENCOUNTER — Other Ambulatory Visit: Payer: Self-pay

## 2013-12-11 ENCOUNTER — Ambulatory Visit (INDEPENDENT_AMBULATORY_CARE_PROVIDER_SITE_OTHER): Payer: Commercial Managed Care - PPO

## 2013-12-11 DIAGNOSIS — Z0000 Encounter for general adult medical examination without abnormal findings: Secondary | ICD-10-CM

## 2013-12-11 LAB — CBC WITH DIFFERENTIAL/PLATELET
Basophils Absolute: 0 10*3/uL (ref 0.0–0.1)
Eosinophils Absolute: 0.1 10*3/uL (ref 0.0–0.7)
MCHC: 33 g/dL (ref 30.0–36.0)
MCV: 85.3 fl (ref 78.0–100.0)
Monocytes Absolute: 0.3 10*3/uL (ref 0.1–1.0)
Neutrophils Relative %: 54.8 % (ref 43.0–77.0)
Platelets: 266 10*3/uL (ref 150.0–400.0)
RDW: 14.5 % (ref 11.5–14.6)
WBC: 6.3 10*3/uL (ref 4.5–10.5)

## 2013-12-11 LAB — BASIC METABOLIC PANEL
BUN: 18 mg/dL (ref 6–23)
Chloride: 106 mEq/L (ref 96–112)
Glucose, Bld: 98 mg/dL (ref 70–99)
Potassium: 4.4 mEq/L (ref 3.5–5.1)

## 2013-12-11 LAB — URINALYSIS, ROUTINE W REFLEX MICROSCOPIC
Leukocytes, UA: NEGATIVE
RBC / HPF: NONE SEEN (ref 0–?)
Specific Gravity, Urine: <=1.005 (ref 1.000–1.030)
Urine Glucose: NEGATIVE
Urobilinogen, UA: 0.2 (ref 0.0–1.0)
WBC, UA: NONE SEEN (ref 0–?)

## 2013-12-11 LAB — HEPATIC FUNCTION PANEL
Bilirubin, Direct: 0.1 mg/dL (ref 0.0–0.3)
Total Bilirubin: 0.7 mg/dL (ref 0.3–1.2)

## 2013-12-11 LAB — LIPID PANEL
HDL: 56.2 mg/dL (ref >39.00)
Total CHOL/HDL Ratio: 4
VLDL: 18.6 mg/dL (ref 0.0–40.0)

## 2013-12-19 ENCOUNTER — Ambulatory Visit (INDEPENDENT_AMBULATORY_CARE_PROVIDER_SITE_OTHER): Payer: Commercial Managed Care - PPO | Admitting: Internal Medicine

## 2013-12-19 ENCOUNTER — Encounter: Payer: Self-pay | Admitting: Internal Medicine

## 2013-12-19 VITALS — BP 110/80 | HR 64 | Temp 98.9°F | Ht 63.0 in | Wt 154.1 lb

## 2013-12-19 DIAGNOSIS — E785 Hyperlipidemia, unspecified: Secondary | ICD-10-CM

## 2013-12-19 DIAGNOSIS — Z0000 Encounter for general adult medical examination without abnormal findings: Secondary | ICD-10-CM

## 2013-12-19 MED ORDER — TRAMADOL HCL 50 MG PO TABS: 50.0000 mg | ORAL_TABLET | Freq: Four times a day (QID) | ORAL | Status: DC | PRN

## 2013-12-19 MED ORDER — ATORVASTATIN CALCIUM 10 MG PO TABS: 10.0000 mg | ORAL_TABLET | Freq: Every day | ORAL | Status: DC

## 2013-12-19 MED ORDER — ZOLPIDEM TARTRATE 10 MG PO TABS: 10.0000 mg | ORAL_TABLET | Freq: Every evening | ORAL | Status: DC | PRN

## 2013-12-19 MED ORDER — ASPIRIN EC 81 MG PO TBEC: 81.0000 mg | DELAYED_RELEASE_TABLET | Freq: Every day | ORAL | Status: DC

## 2013-12-19 MED ORDER — CYCLOBENZAPRINE HCL 10 MG PO TABS: 10.0000 mg | ORAL_TABLET | Freq: Three times a day (TID) | ORAL | Status: DC | PRN

## 2013-12-19 NOTE — Progress Notes (Signed)
Subjective:    Patient ID: Annette [name], female    DOB: [birth year], 57 y.o.   MRN: [id number]  HPI  Here for wellness and f/u;  Overall doing ok;  Pt denies CP, worsening SOB, DOE, wheezing, orthopnea, PND, worsening LE edema, palpitations, dizziness or syncope.  Pt denies neurological change such as new headache, facial or extremity weakness.  Pt denies polydipsia, polyuria, or low sugar symptoms. Pt states overall good compliance with treatment and medications, good tolerability, and has been trying to follow lower cholesterol diet.  Pt denies worsening depressive symptoms, suicidal ideation or panic. No fever, night sweats, wt loss, loss of appetite, or other constitutional symptoms.  Pt states good ability with ADL's, has low fall risk, home safety reviewed and adequate, no other significant changes in hearing or vision, and only occasionally active with exercise.  No acute complaints. Has gained several lbs in the past yr, plans to start running more soon.  Does have occasional right hip/upper thigh pain with running, better with flexeril. Past Medical History  Diagnosis Date  . HYPERLIPIDEMIA 08/20/2007  . OBESITY, MILD 08/20/2007  . ANXIETY 11/10/2010  . DEPRESSION 08/20/2007  . ATTENTION DEFICIT DISORDER 11/10/2010  . Carpal tunnel syndrome 08/20/2007  . HYPERTENSION 08/20/2007  . Raynaud's syndrome 08/20/2007  . ALLERGIC RHINITIS 10/01/2007  . EMPHYSEMA 06/16/2010  . BARTHOLIN'S CYST 08/20/2007  . HOT FLASHES 09/26/2009  . MENOPAUSAL DISORDER 11/07/2009  . POLYARTHRALGIA 11/06/2008  . OSTEOPENIA 06/16/2010  . Insomnia, unspecified 10/01/2007  . COLONIC POLYPS, HX OF 08/20/2007  . ISCHEMIC COLITIS, HX OF 10/01/2007   Past Surgical History  Procedure Laterality Date  . Tonsillectomy      reports that she quit smoking about 22 years ago. She has never used smokeless tobacco. She reports that she drinks alcohol. She reports that she does not use illicit drugs. family history includes ADD /  ADHD in her daughter; Breast cancer in her maternal grandmother; Coronary artery disease in her other; Hypertension in her other; Osteoporosis in her maternal grandmother and mother; Prostate cancer in her other. There is no history of Colon cancer. No Known Allergies No current outpatient prescriptions on file prior to visit.   No current facility-administered medications on file prior to visit.   Review of Systems Constitutional: Negative for diaphoresis, activity change, appetite change or unexpected weight change.  HENT: Negative for hearing loss, ear pain, facial swelling, mouth sores and neck stiffness.   Eyes: Negative for pain, redness and visual disturbance.  Respiratory: Negative for shortness of breath and wheezing.   Cardiovascular: Negative for chest pain and palpitations.  Gastrointestinal: Negative for diarrhea, blood in stool, abdominal distention or other pain Genitourinary: Negative for hematuria, flank pain or change in urine volume.  Musculoskeletal: Negative for myalgias and joint swelling.  Skin: Negative for color change and wound.  Neurological: Negative for syncope and numbness. other than noted Hematological: Negative for adenopathy.  Psychiatric/Behavioral: Negative for hallucinations, self-injury, decreased concentration and agitation.      Objective:   Physical Exam BP 110/80  Pulse 64  Temp(Src) 98.9 F (37.2 C) (Oral)  Ht 5\' 3"  (1.6 m)  Wt 154 lb 2 oz (69.911 kg)  BMI 27.31 kg/m2  SpO2 98% VS noted,  Constitutional: Pt is oriented to person, place, and time. Appears well-developed and well-nourished.  Head: Normocephalic and atraumatic.  Right Ear: External ear normal.  Left Ear: External ear normal.  Nose: Nose normal.  Mouth/Throat: Oropharynx is clear and moist.  Eyes:  Conjunctivae and EOM are normal. Pupils are equal, round, and reactive to light.  Neck: Normal range of motion. Neck supple. No JVD present. No tracheal deviation present.    Cardiovascular: Normal rate, regular rhythm, normal heart sounds and intact distal pulses.   Pulmonary/Chest: Effort normal and breath sounds normal.  Abdominal: Soft. Bowel sounds are normal. There is no tenderness. No HSM  Musculoskeletal: Normal range of motion. Exhibits no edema.  Lymphadenopathy:  Has no cervical adenopathy.  Neurological: Pt is alert and oriented to person, place, and time. Pt has normal reflexes. No cranial nerve deficit.  Skin: Skin is warm and dry. No rash noted.  Psychiatric:  Has  normal mood and affect. Behavior is normal.     Assessment & Plan:

## 2013-12-19 NOTE — Progress Notes (Signed)
Pre-visit discussion using our clinic review tool. No additional management support is needed unless otherwise documented below in the visit note.  

## 2013-12-19 NOTE — Assessment & Plan Note (Signed)
To start asa 81 mg per day, as well as lipitor 10 qd

## 2013-12-19 NOTE — Assessment & Plan Note (Signed)

## 2013-12-19 NOTE — Patient Instructions (Signed)
Please start Aspirin 81 mg - 1 per day - Enteric Coated only Please take all new medication as prescribed - the Lipitor (generic) at 10 mg per day Please continue all other medications as before, and refills have been done if requested. Please have the pharmacy call with any other refills you may need. Please continue your efforts at being more active, low cholesterol diet, and weight control. You are otherwise up to date with prevention measures today. Please keep your appointments with your specialists as you have planned - GYN Your ECG and lab work was otherwise OK  Please remember to sign up for My Chart if you have not done so, as this will be important to you in the future with finding out test results, communicating by private email, and scheduling acute appointments online when needed.  Please return in 1 year for your yearly visit, or sooner if needed, with Lab testing done 3-5 days before

## 2014-03-23 ENCOUNTER — Other Ambulatory Visit: Payer: Self-pay | Admitting: Internal Medicine

## 2014-03-23 ENCOUNTER — Encounter: Payer: Self-pay | Admitting: Family Medicine

## 2014-03-23 ENCOUNTER — Ambulatory Visit (INDEPENDENT_AMBULATORY_CARE_PROVIDER_SITE_OTHER): Payer: Commercial Managed Care - PPO | Admitting: Family Medicine

## 2014-03-23 VITALS — BP 110/70 | HR 51 | Temp 98.0°F | Resp 18 | Ht 63.0 in | Wt 157.0 lb

## 2014-03-23 DIAGNOSIS — J019 Acute sinusitis, unspecified: Secondary | ICD-10-CM

## 2014-03-23 MED ORDER — AMOXICILLIN-POT CLAVULANATE 875-125 MG PO TABS: 1.0000 | ORAL_TABLET | Freq: Two times a day (BID) | ORAL | Status: DC

## 2014-03-23 NOTE — Patient Instructions (Addendum)
Get flonase or nasacort over the counter and con't the zyrtec     Sinusitis Sinusitis is redness, soreness, and swelling (inflammation) of the paranasal sinuses. Paranasal sinuses are air pockets within the bones of your face (beneath the eyes, the middle of the forehead, or above the eyes). In healthy paranasal sinuses, mucus is able to drain out, and air is able to circulate through them by way of your nose. However, when your paranasal sinuses are inflamed, mucus and air can become trapped. This can allow bacteria and other germs to grow and cause infection. Sinusitis can develop quickly and last only a short time (acute) or continue over a long period (chronic). Sinusitis that lasts for more than 12 weeks is considered chronic.  CAUSES  Causes of sinusitis include:  Allergies.  Structural abnormalities, such as displacement of the cartilage that separates your nostrils (deviated septum), which can decrease the air flow through your nose and sinuses and affect sinus drainage.  Functional abnormalities, such as when the small hairs (cilia) that line your sinuses and help remove mucus do not work properly or are not present. SYMPTOMS  Symptoms of acute and chronic sinusitis are the same. The primary symptoms are pain and pressure around the affected sinuses. Other symptoms include:  Upper toothache.  Earache.  Headache.  Bad breath.  Decreased sense of smell and taste.  A cough, which worsens when you are lying flat.  Fatigue.  Fever.  Thick drainage from your nose, which often is green and may contain pus (purulent).  Swelling and warmth over the affected sinuses. DIAGNOSIS  Your caregiver will perform a physical exam. During the exam, your caregiver may:  Look in your nose for signs of abnormal growths in your nostrils (nasal polyps).  Tap over the affected sinus to check for signs of infection.  View the inside of your sinuses (endoscopy) with a special imaging device  with a light attached (endoscope), which is inserted into your sinuses. If your caregiver suspects that you have chronic sinusitis, one or more of the following tests may be recommended:  Allergy tests.  Nasal culture A sample of mucus is taken from your nose and sent to a lab and screened for bacteria.  Nasal cytology A sample of mucus is taken from your nose and examined by your caregiver to determine if your sinusitis is related to an allergy. TREATMENT  Most cases of acute sinusitis are related to a viral infection and will resolve on their own within 10 days. Sometimes medicines are prescribed to help relieve symptoms (pain medicine, decongestants, nasal steroid sprays, or saline sprays).  However, for sinusitis related to a bacterial infection, your caregiver will prescribe antibiotic medicines. These are medicines that will help kill the bacteria causing the infection.  Rarely, sinusitis is caused by a fungal infection. In theses cases, your caregiver will prescribe antifungal medicine. For some cases of chronic sinusitis, surgery is needed. Generally, these are cases in which sinusitis recurs more than 3 times per year, despite other treatments. HOME CARE INSTRUCTIONS   Drink plenty of water. Water helps thin the mucus so your sinuses can drain more easily.  Use a humidifier.  Inhale steam 3 to 4 times a day (for example, sit in the bathroom with the shower running).  Apply a warm, moist washcloth to your face 3 to 4 times a day, or as directed by your caregiver.  Use saline nasal sprays to help moisten and clean your sinuses.  Take over-the-counter or prescription  medicines for pain, discomfort, or fever only as directed by your caregiver. SEEK IMMEDIATE MEDICAL CARE IF:  You have increasing pain or severe headaches.  You have nausea, vomiting, or drowsiness.  You have swelling around your face.  You have vision problems.  You have a stiff neck.  You have difficulty  breathing. MAKE SURE YOU:   Understand these instructions.  Will watch your condition.  Will get help right away if you are not doing well or get worse. Document Released: 12/13/2005 Document Revised: 03/06/2012 Document Reviewed: 12/28/2011 [facility] Patient Information 2014 [place], [place].

## 2014-03-23 NOTE — Progress Notes (Signed)
  Subjective:     Annette [name] is a 58 y.o. female who presents for evaluation of symptoms of a URI. Symptoms include congestion, cough described as productive, low grade fever, nasal congestion, sinus pressure and sore throat. Onset of symptoms was 2 weeks ago, and has been gradually worsening since that time. Treatment to date: antihistamines and cough suppressants.  The following portions of the patient's history were reviewed and updated as appropriate:  She  has a past medical history of HYPERLIPIDEMIA (08/20/2007); OBESITY, MILD (08/20/2007); ANXIETY (11/10/2010); DEPRESSION (08/20/2007); ATTENTION DEFICIT DISORDER (11/10/2010); Carpal tunnel syndrome (08/20/2007); HYPERTENSION (08/20/2007); Raynaud's syndrome (08/20/2007); ALLERGIC RHINITIS (10/01/2007); EMPHYSEMA (06/16/2010); BARTHOLIN'S CYST (08/20/2007); HOT FLASHES (09/26/2009); MENOPAUSAL DISORDER (11/07/2009); POLYARTHRALGIA (11/06/2008); OSTEOPENIA (06/16/2010); Insomnia, unspecified (10/01/2007); COLONIC POLYPS, HX OF (08/20/2007); and ISCHEMIC COLITIS, HX OF (10/01/2007). She  does not have any pertinent problems on file. She  has past surgical history that includes Tonsillectomy. Her family history includes ADD / ADHD in her daughter; Breast cancer in her maternal grandmother; Coronary artery disease in her other; Hypertension in her other; Osteoporosis in her maternal grandmother and mother; Prostate cancer in her other. There is no history of Colon cancer. She  reports that she quit smoking about 23 years ago. She has never used smokeless tobacco. She reports that she drinks alcohol. She reports that she does not use illicit drugs. She has a current medication list which includes the following prescription(s): aspirin ec, atorvastatin, cyclobenzaprine, tramadol, and zolpidem. Current Outpatient Prescriptions on File Prior to Visit  Medication Sig Dispense Refill  . aspirin EC 81 MG tablet Take 1 tablet (81 mg total) by mouth daily.  90 tablet   11  . atorvastatin (LIPITOR) 10 MG tablet Take 1 tablet (10 mg total) by mouth daily.  90 tablet  3  . cyclobenzaprine (FLEXERIL) 10 MG tablet Take 1 tablet (10 mg total) by mouth 3 (three) times daily as needed.  90 tablet  3  . traMADol (ULTRAM) 50 MG tablet Take 1 tablet (50 mg total) by mouth 4 (four) times daily as needed.  60 tablet  2  . zolpidem (AMBIEN) 10 MG tablet Take 1 tablet (10 mg total) by mouth at bedtime as needed.  90 tablet  1   No current facility-administered medications on file prior to visit.   She has No Known Allergies..  Review of Systems Pertinent items are noted in HPI.   Objective:    BP 110/70  Pulse 51  Temp(Src) 98 F (36.7 C) (Oral)  Resp 18  Ht 5\' 3"  (1.6 m)  Wt 157 lb (71.215 kg)  BMI 27.82 kg/m2  SpO2 97% General appearance: alert, cooperative, appears stated age and no distress Ears: normal TM's and external ear canals both ears Nose: green discharge, moderate congestion, turbinates red, swollen, sinus tenderness bilateral Throat: lips, mucosa, and tongue normal; teeth and gums normal Neck: marked anterior cervical adenopathy, supple, symmetrical, trachea midline and thyroid not enlarged, symmetric, no tenderness/mass/nodules Lungs: clear to auscultation bilaterally Heart: regular rate and rhythm, S1, S2 normal, no murmur, click, rub or gallop   Assessment:    sinusitis and viral upper respiratory illness   Plan:    Discussed the diagnosis and treatment of sinusitis. Suggested symptomatic OTC remedies. Augmentin per orders. Nasal steroids per orders. Follow up as needed.

## 2014-03-23 NOTE — Progress Notes (Signed)
Pre-visit discussion using our clinic review tool. No additional management support is needed unless otherwise documented below in the visit note.  

## 2014-03-25 NOTE — Telephone Encounter (Signed)
Ok to refill? Last OV 12.24.14 Last filled 12.24.14

## 2014-03-26 NOTE — Telephone Encounter (Signed)
Done hardcopy to robin  

## 2014-03-26 NOTE — Telephone Encounter (Signed)
Faxed hardcopy of Tramadol 50 mg #60 with 2 RF's to [place]

## 2014-04-23 ENCOUNTER — Other Ambulatory Visit: Payer: Self-pay | Admitting: *Deleted

## 2014-04-23 MED ORDER — ATORVASTATIN CALCIUM 10 MG PO TABS: 10.0000 mg | ORAL_TABLET | Freq: Every day | ORAL | Status: DC

## 2014-06-09 ENCOUNTER — Encounter (HOSPITAL_COMMUNITY): Payer: Self-pay | Admitting: Emergency Medicine

## 2014-06-09 DIAGNOSIS — E663 Overweight: Secondary | ICD-10-CM | POA: Diagnosis not present

## 2014-06-09 DIAGNOSIS — K921 Melena: Secondary | ICD-10-CM | POA: Insufficient documentation

## 2014-06-09 DIAGNOSIS — E785 Hyperlipidemia, unspecified: Secondary | ICD-10-CM | POA: Insufficient documentation

## 2014-06-09 DIAGNOSIS — M2550 Pain in unspecified joint: Secondary | ICD-10-CM | POA: Diagnosis not present

## 2014-06-09 DIAGNOSIS — Z79899 Other long term (current) drug therapy: Secondary | ICD-10-CM | POA: Insufficient documentation

## 2014-06-09 DIAGNOSIS — R1031 Right lower quadrant pain: Secondary | ICD-10-CM | POA: Diagnosis not present

## 2014-06-09 DIAGNOSIS — J438 Other emphysema: Secondary | ICD-10-CM | POA: Insufficient documentation

## 2014-06-09 DIAGNOSIS — F329 Major depressive disorder, single episode, unspecified: Secondary | ICD-10-CM | POA: Insufficient documentation

## 2014-06-09 DIAGNOSIS — Z860100 Personal history of colon polyps, unspecified: Secondary | ICD-10-CM | POA: Insufficient documentation

## 2014-06-09 DIAGNOSIS — F3289 Other specified depressive episodes: Secondary | ICD-10-CM | POA: Diagnosis not present

## 2014-06-09 DIAGNOSIS — Z8669 Personal history of other diseases of the nervous system and sense organs: Secondary | ICD-10-CM | POA: Diagnosis not present

## 2014-06-09 DIAGNOSIS — F411 Generalized anxiety disorder: Secondary | ICD-10-CM | POA: Insufficient documentation

## 2014-06-09 DIAGNOSIS — I10 Essential (primary) hypertension: Secondary | ICD-10-CM | POA: Diagnosis not present

## 2014-06-09 DIAGNOSIS — K922 Gastrointestinal hemorrhage, unspecified: Secondary | ICD-10-CM | POA: Diagnosis not present

## 2014-06-09 DIAGNOSIS — K625 Hemorrhage of anus and rectum: Secondary | ICD-10-CM | POA: Diagnosis present

## 2014-06-09 DIAGNOSIS — Z7982 Long term (current) use of aspirin: Secondary | ICD-10-CM | POA: Insufficient documentation

## 2014-06-09 DIAGNOSIS — Z8742 Personal history of other diseases of the female genital tract: Secondary | ICD-10-CM | POA: Diagnosis not present

## 2014-06-09 DIAGNOSIS — Z87891 Personal history of nicotine dependence: Secondary | ICD-10-CM | POA: Insufficient documentation

## 2014-06-09 NOTE — ED Notes (Signed)
Patient presents stating about 5pm she was running on the treadmill and started with abd pain.  She has been to the BR 5 times with bright red stool and abd cramping

## 2014-06-10 ENCOUNTER — Telehealth: Payer: Self-pay | Admitting: Internal Medicine

## 2014-06-10 ENCOUNTER — Emergency Department (HOSPITAL_COMMUNITY)
Admission: EM | Admit: 2014-06-10 | Discharge: 2014-06-10 | Disposition: A | Discharge status: Home / Self Care | Payer: Commercial Managed Care - PPO | Attending: Emergency Medicine | Admitting: Emergency Medicine

## 2014-06-10 DIAGNOSIS — K922 Gastrointestinal hemorrhage, unspecified: Secondary | ICD-10-CM | POA: Diagnosis not present

## 2014-06-10 LAB — COMPREHENSIVE METABOLIC PANEL
ALK PHOS: 76 U/L (ref 39–117)
ALT: 16 U/L (ref 0–35)
AST: 28 U/L (ref 0–37)
Albumin: 4.2 g/dL (ref 3.5–5.2)
BUN: 17 mg/dL (ref 6–23)
CHLORIDE: 99 meq/L (ref 96–112)
CO2: 22 mEq/L (ref 19–32)
Calcium: 9.9 mg/dL (ref 8.4–10.5)
Creatinine, Ser: 1.11 mg/dL — ABNORMAL HIGH (ref 0.50–1.10)
GFR calc non Af Amer: 55 mL/min — ABNORMAL LOW (ref >90)
GFR, EST AFRICAN AMERICAN: 64 mL/min — AB (ref >90)
GLUCOSE: 125 mg/dL — AB (ref 70–99)
Potassium: 4.2 mEq/L (ref 3.7–5.3)
Sodium: 137 mEq/L (ref 137–147)
TOTAL PROTEIN: 7.6 g/dL (ref 6.0–8.3)
Total Bilirubin: 0.5 mg/dL (ref 0.3–1.2)

## 2014-06-10 LAB — CBC WITH DIFFERENTIAL/PLATELET
Basophils Absolute: 0 10*3/uL (ref 0.0–0.1)
Basophils Absolute: 0 10*3/uL (ref 0.0–0.1)
Basophils Relative: 0 % (ref 0–1)
Basophils Relative: 0 % (ref 0–1)
EOS ABS: 0 10*3/uL (ref 0.0–0.7)
Eosinophils Absolute: 0.1 10*3/uL (ref 0.0–0.7)
Eosinophils Relative: 0 % (ref 0–5)
Eosinophils Relative: 1 % (ref 0–5)
HCT: 38.1 % (ref 36.0–46.0)
HCT: 41.3 % (ref 36.0–46.0)
HEMOGLOBIN: 12.4 g/dL (ref 12.0–15.0)
HEMOGLOBIN: 13.7 g/dL (ref 12.0–15.0)
LYMPHS ABS: 2.7 10*3/uL (ref 0.7–4.0)
LYMPHS ABS: 2.8 10*3/uL (ref 0.7–4.0)
Lymphocytes Relative: 26 % (ref 12–46)
Lymphocytes Relative: 31 % (ref 12–46)
MCH: 28.2 pg (ref 26.0–34.0)
MCH: 28.7 pg (ref 26.0–34.0)
MCHC: 32.5 g/dL (ref 30.0–36.0)
MCHC: 33.2 g/dL (ref 30.0–36.0)
MCV: 86.4 fL (ref 78.0–100.0)
MCV: 86.6 fL (ref 78.0–100.0)
MONOS PCT: 5 % (ref 3–12)
MONOS PCT: 6 % (ref 3–12)
Monocytes Absolute: 0.5 10*3/uL (ref 0.1–1.0)
Monocytes Absolute: 0.5 10*3/uL (ref 0.1–1.0)
NEUTROS ABS: 5.4 10*3/uL (ref 1.7–7.7)
NEUTROS PCT: 62 % (ref 43–77)
NEUTROS PCT: 69 % (ref 43–77)
Neutro Abs: 7.4 10*3/uL (ref 1.7–7.7)
Platelets: 214 10*3/uL (ref 150–400)
Platelets: 235 10*3/uL (ref 150–400)
RBC: 4.4 MIL/uL (ref 3.87–5.11)
RBC: 4.78 MIL/uL (ref 3.87–5.11)
RDW: 14.1 % (ref 11.5–15.5)
RDW: 14.2 % (ref 11.5–15.5)
WBC: 10.7 10*3/uL — ABNORMAL HIGH (ref 4.0–10.5)
WBC: 8.6 10*3/uL (ref 4.0–10.5)

## 2014-06-10 LAB — I-STAT CG4 LACTIC ACID, ED: LACTIC ACID, VENOUS: 0.68 mmol/L (ref 0.5–2.2)

## 2014-06-10 LAB — POC OCCULT BLOOD, ED: FECAL OCCULT BLD: POSITIVE — AB

## 2014-06-10 MED ORDER — HYDROCODONE-ACETAMINOPHEN 5-325 MG PO TABS: 1.0000 | ORAL_TABLET | Freq: Four times a day (QID) | ORAL | Status: DC | PRN

## 2014-06-10 MED ORDER — ONDANSETRON 4 MG PO TBDP
8.0000 mg | ORAL_TABLET | Freq: Once | ORAL | Status: AC
  Administered 2014-06-10: 8 mg via ORAL

## 2014-06-10 MED ORDER — SODIUM CHLORIDE 0.9 % IV BOLUS (SEPSIS)
1000.0000 mL | Freq: Once | INTRAVENOUS | Status: AC
  Administered 2014-06-10: 1000 mL via INTRAVENOUS

## 2014-06-10 MED ORDER — HYDROMORPHONE HCL PF 1 MG/ML IJ SOLN
1.0000 mg | Freq: Once | INTRAMUSCULAR | Status: AC
  Administered 2014-06-10: 1 mg via INTRAVENOUS
  Filled 2014-06-10: qty 1

## 2014-06-10 MED ORDER — ONDANSETRON 4 MG PO TBDP
ORAL_TABLET | ORAL | Status: AC
  Filled 2014-06-10: qty 2

## 2014-06-10 NOTE — Discharge Instructions (Signed)
We saw you in the ER for the bloody stools. Labs in the ER are not indicating massive bleed. We are not sure what is causing your symptoms. You will need to see GI doctor for optimal care.  Return to the ER if there is heavy bleeding, weakness, dizziness, or if you feel like fainting.   Gastrointestinal Bleeding Gastrointestinal (GI) bleeding means there is bleeding somewhere along the digestive tract, between the mouth and anus. CAUSES  There are many different problems that can cause GI bleeding. Possible causes include:  Esophagitis. This is inflammation, irritation, or swelling of the esophagus.  Hemorrhoids.These are veins that are full of blood (engorged) in the rectum. They cause pain, inflammation, and may bleed.  Anal fissures.These are areas of painful tearing which may bleed. They are often caused by passing hard stool.  Diverticulosis.These are pouches that form on the colon over time, with age, and may bleed significantly.  Diverticulitis.This is inflammation in areas with diverticulosis. It can cause pain, fever, and bloody stools, although bleeding is rare.  Polyps and cancer. Colon cancer often starts out as precancerous polyps.  Gastritis and ulcers.Bleeding from the upper gastrointestinal tract (near the stomach) may travel through the intestines and produce black, sometimes tarry, often bad smelling stools. In certain cases, if the bleeding is fast enough, the stools may not be black, but red. This condition may be life-threatening. SYMPTOMS   Vomiting bright red blood or material that looks like coffee grounds.  Bloody, black, or tarry stools. DIAGNOSIS  Your caregiver may diagnose your condition by taking your history and performing a physical exam. More tests may be needed, including:  X-rays and other imaging tests.  Esophagogastroduodenoscopy (EGD). This test uses a flexible, lighted tube to look at your esophagus, stomach, and small  intestine.  Colonoscopy. This test uses a flexible, lighted tube to look at your colon. TREATMENT  Treatment depends on the cause of your bleeding.   For bleeding from the esophagus, stomach, small intestine, or colon, the caregiver doing your EGD or colonoscopy may be able to stop the bleeding as part of the procedure.  Inflammation or infection of the colon can be treated with medicines.  Many rectal problems can be treated with creams, suppositories, or warm baths.  Surgery is sometimes needed.  Blood transfusions are sometimes needed if you have lost a lot of blood. If bleeding is slow, you may be allowed to go home. If there is a lot of bleeding, you will need to stay in the hospital for observation. HOME CARE INSTRUCTIONS   Take any medicines exactly as prescribed.  Keep your stools soft by eating foods that are high in fiber. These foods include whole grains, legumes, fruits, and vegetables. Prunes (1 to 3 a day) work well for many people.  Drink enough fluids to keep your urine clear or pale yellow. SEEK IMMEDIATE MEDICAL CARE IF:   Your bleeding increases.  You feel lightheaded, weak, or you faint.  You have severe cramps in your back or abdomen.  You pass large blood clots in your stool.  Your problems are getting worse. MAKE SURE YOU:   Understand these instructions.  Will watch your condition.  Will get help right away if you are not doing well or get worse. Document Released: 12/10/2000 Document Revised: 11/29/2012 Document Reviewed: 11/22/2011 [facility] Patient Information 2014 [place], [place].

## 2014-06-10 NOTE — ED Notes (Signed)
This RN unable to gain IV access. Carmell Austria, RN to attempt.

## 2014-06-10 NOTE — Telephone Encounter (Signed)
Patient with history of ischemic colitis Recent episode of bleeding and pain certainly could be recurrent ischemic colitis If she is having diarrhea, stool studies could be ordered Care is supportive for ischemic colitis, and she can use the prescribed Vicodin as directed and as needed for pain Will plan to repeat CBC labs on Friday when she is seen If bleeding or pain become more brisk/severe she should notify us Please let her know we are working her in at the first available slot

## 2014-06-10 NOTE — ED Provider Notes (Signed)
CSN: [id number]     Arrival date & time 06/09/14  2337 History   First MD Initiated Contact with Patient 06/10/14 0207     Chief Complaint  Patient presents with  . Rectal Bleeding     (Consider location/radiation/quality/duration/timing/severity/associated sxs/prior Treatment) HPI Comments: Pt comes in with cc of rectal pain and abd pain. She also had rectal bleed. Pt was running on treadmill, and started having lower abd pain and back pain. Pt went to bathroom, and had loose BM, followed by BRBPR x 4. Pt reports hx of ischemic colitis 3-4 years ago. Seen by [facility] in the past. Dx was made clinically or via colonoscopy at that time. Pt is not on any anticoagulant. Pain is moderately severe. There is no nausea, emesis, fevers, dizziness.  Patient is a 58 y.o. female presenting with hematochezia. The history is provided by the patient and medical records.  Rectal Bleeding Associated symptoms: abdominal pain   Associated symptoms: no dizziness, no light-headedness and no vomiting     Past Medical History  Diagnosis Date  . HYPERLIPIDEMIA 08/20/2007  . OBESITY, MILD 08/20/2007  . ANXIETY 11/10/2010  . DEPRESSION 08/20/2007  . ATTENTION DEFICIT DISORDER 11/10/2010  . Carpal tunnel syndrome 08/20/2007  . HYPERTENSION 08/20/2007  . Raynaud's syndrome 08/20/2007  . ALLERGIC RHINITIS 10/01/2007  . EMPHYSEMA 06/16/2010  . BARTHOLIN'S CYST 08/20/2007  . HOT FLASHES 09/26/2009  . MENOPAUSAL DISORDER 11/07/2009  . POLYARTHRALGIA 11/06/2008  . OSTEOPENIA 06/16/2010  . Insomnia, unspecified 10/01/2007  . COLONIC POLYPS, HX OF 08/20/2007  . ISCHEMIC COLITIS, HX OF 10/01/2007   Past Surgical History  Procedure Laterality Date  . Tonsillectomy     Family History  Problem Relation Age of Onset  . Osteoporosis Mother   . ADD / ADHD Daughter   . Osteoporosis Maternal Grandmother   . Breast cancer Maternal Grandmother   . Coronary artery disease Other   . Hypertension Other   . Prostate cancer  Other   . Colon cancer Neg Hx    History  Substance Use Topics  . Smoking status: Former Smoker    Quit date: 12/27/1990  . Smokeless tobacco: Never Used  . Alcohol Use: Yes     Comment: one drink per day   OB History   Grav Para Term Preterm Abortions TAB SAB Ect Mult Living                 Review of Systems  Constitutional: Negative for activity change.  HENT: Negative for facial swelling.   Respiratory: Negative for cough, shortness of breath and wheezing.   Cardiovascular: Negative for chest pain.  Gastrointestinal: Positive for abdominal pain, blood in stool and hematochezia. Negative for nausea, vomiting, diarrhea, constipation and abdominal distention.  Genitourinary: Negative for hematuria and difficulty urinating.  Musculoskeletal: Negative for neck pain.  Skin: Negative for color change.  Neurological: Negative for dizziness, speech difficulty and light-headedness.  Hematological: Does not bruise/bleed easily.  Psychiatric/Behavioral: Negative for confusion.      Allergies  Review of patient's allergies indicates no known allergies.  Home Medications   Prior to Admission medications   Medication Sig Start Date End Date Taking? Authorizing Provider  aspirin EC 81 MG tablet Take 1 tablet (81 mg total) by mouth daily. 12/19/13  Yes Biagio Borg, MD  atorvastatin (LIPITOR) 10 MG tablet Take 1 tablet (10 mg total) by mouth daily. 04/23/14  Yes Biagio Borg, MD  cyclobenzaprine (FLEXERIL) 10 MG tablet Take 10 mg by mouth  3 (three) times daily as needed for muscle spasms.   Yes Historical Provider, MD  Estradiol (VAGIFEM) 10 MCG TABS vaginal tablet Place 10 mcg vaginally 2 (two) times a week.   Yes Historical Provider, MD  ibuprofen (ADVIL,MOTRIN) 200 MG tablet Take 400 mg by mouth every 6 (six) hours as needed for moderate pain.   Yes Historical Provider, MD  traMADol (ULTRAM) 50 MG tablet Take 50 mg by mouth every 6 (six) hours as needed for moderate pain.   Yes  Historical Provider, MD  zolpidem (AMBIEN) 10 MG tablet Take 10 mg by mouth at bedtime as needed for sleep.   Yes Historical Provider, MD  HYDROcodone-acetaminophen (NORCO/VICODIN) 5-325 MG per tablet Take 1 tablet by mouth every 6 (six) hours as needed. 06/10/14   Tykel Badie Kathrynn Humble, MD   BP 104/68  Pulse 72  Temp(Src) 97.6 F (36.4 C) (Oral)  Resp 14  Ht 5\' 3"  (1.6 m)  Wt 155 lb (70.308 kg)  BMI 27.46 kg/m2  SpO2 100% Physical Exam  Nursing note and vitals reviewed. Constitutional: She is oriented to person, place, and time. She appears well-developed and well-nourished.  HENT:  Head: Normocephalic and atraumatic.  Eyes: EOM are normal. Pupils are equal, round, and reactive to light.  Neck: Neck supple.  Cardiovascular: Normal rate, regular rhythm and normal heart sounds.   No murmur heard. Pulmonary/Chest: Effort normal. No respiratory distress.  Abdominal: Soft. She exhibits no distension. There is tenderness. There is no rebound and no guarding.  Pt has right lower quadrant tenderness - with no peritoneal signs.  Neurological: She is alert and oriented to person, place, and time.  Skin: Skin is warm and dry.    ED Course  Procedures (including critical care time) Labs Review Labs Reviewed  CBC WITH DIFFERENTIAL - Abnormal; Notable for the following:    WBC 10.7 (*)    All other components within normal limits  COMPREHENSIVE METABOLIC PANEL - Abnormal; Notable for the following:    Glucose, Bld 125 (*)    Creatinine, Ser 1.11 (*)    GFR calc non Af Amer 55 (*)    GFR calc Af Amer 64 (*)    All other components within normal limits  POC OCCULT BLOOD, ED - Abnormal; Notable for the following:    Fecal Occult Bld POSITIVE (*)    All other components within normal limits  CBC WITH DIFFERENTIAL  I-STAT CG4 LACTIC ACID, ED    Imaging Review No results found.   EKG Interpretation None      MDM   Final diagnoses:  Lower GI bleed    DDx includes: Diverticular  bleed Colon cancer Rectal bleed Internal hemorrhoids External hemorrhoids  Pt comes in with cc of BRBPR. She has hx of raynaud's. No significant co morbidities. Pt reports having had ischemic colitis in the past - 2007, diagnosed with CT. She didn't need any surgery or even hospital admission at that time. She has no IBD.  Pt had a recent CT results are as following:  COLON FINDINGS: A normal appearing cecum, ileocecal valve, and  appendiceal orifice were identified. The ascending, hepatic  flexure, transverse, splenic flexure, descending, sigmoid colon and  rectum appeared unremarkable. No polyps or cancers were seen.  Retroflexed views revealed no abnormalities.   We have had serial abd exam - that is unchanged. H/H x 2 -stable, no bloody stools here.  Pt is stable for discharge. Return precautions discussed.    Varney Biles, MD 06/10/14 [phone]

## 2014-06-10 NOTE — ED Notes (Signed)
Pt with hx of ischemic colitis.

## 2014-06-10 NOTE — Telephone Encounter (Signed)
Patient notified She may want to go see [facility] she is not sure.  She will call back and cancel appt here if she goes to [facility].  She is advised that she should not see [place] both

## 2014-06-10 NOTE — Telephone Encounter (Signed)
Patient reports she was in the ER last night for lower abdominal pain and bright red rectal bleeding.  She reports that she had one episode of bleeding when she left the ER.  She wants to be seen.  No openings with you or APP until Friday.  I have placed her on the schedule for Friday, but she wants to know what to do until then?  Please review ER note and advise if there is any additional orders until then

## 2014-06-11 DIAGNOSIS — R1013 Epigastric pain: Secondary | ICD-10-CM | POA: Insufficient documentation

## 2014-06-11 DIAGNOSIS — K625 Hemorrhage of anus and rectum: Secondary | ICD-10-CM | POA: Insufficient documentation

## 2014-06-13 ENCOUNTER — Telehealth: Payer: Self-pay | Admitting: Internal Medicine

## 2014-06-13 NOTE — Telephone Encounter (Signed)
Patient Information:  Caller Name: Lili  Phone: [phone]  Patient: Annette [name], Annette [name]  Gender: Female  DOB: 02-16-1956  Age: 58 Years  PCP: Cathlean Cower (Adults only)  Pregnant: No  Office Follow Up:  Does the office need to follow up with this patient?: No  Instructions For The Office: N/A  RN Note:  Headache present in temples since 06/09/14. Seen in ED for lower GI bleeding. IV inserted into right hand was unsucessful and blood was drawn from same hand.  Bruising present. Was worried there was something wrong with her circulation or a blood clot.  Reports numbness resolved, tingling of right hand improving, and color nearly normal except for bruised area. History of carpel tunnel and Raynauds syndrome. Try warm compress to bruised area for 20 minutes to help reasorb bruise.  Try gentle hand/finger stretching excersises to improve blood flow to hand.  No appointments remain at [facility] or [facility] office.  Patient decided that since symptoms are nearly gone, she does not want to schedule appointment now.  Will call back if symptoms recur.  Symptoms  Reason For Call & Symptoms: Right hand and lower arm tingling now; recently top part of hand/wrist was numb for a few minutes.  Concerned about lack of circulation after failed IV insertion and blood draw from top right hand and bruising in forearm.  Initially, Right hand felt cooler than left hand,  but now resolving.  Reviewed Health History In EMR: Yes  Reviewed Medications In EMR: Yes  Reviewed Allergies In EMR: Yes  Reviewed Surgeries / Procedures: Yes  Date of Onset of Symptoms: 06/13/2014  Treatments Tried: walking around, moving arm/and  Treatments Tried Worked: Yes OB / GYN:  LMP: Unknown  Guideline(s) Used:  Neurologic Deficit  Disposition Per Guideline:   Go to Office Now  Reason For Disposition Reached:   Neurologic deficit that was transient (now gone), ANY of the following:   Weakness of the face, arm, or leg on one  side of the body  Numbness of the face, arm, or leg on one side of the body  Loss of speech or garbled speech  Advice Given:  Transient tingling in hand (e.g., pins and needles) after prolonged laying on arm:   Expected Course - Tingling should go away after several minutes of stretching and gentle movement of the arm.  Call Back If:  Symptoms do not go away within 30 minutes  You become worse.  Patient Refused Recommendation:  Patient Will Follow Up With Office Later  After triage, reports symptoms greatly improved. Will call back if decides to be seen.

## 2014-06-14 ENCOUNTER — Ambulatory Visit: Payer: Commercial Managed Care - PPO | Admitting: Nurse Practitioner

## 2014-08-16 ENCOUNTER — Telehealth: Payer: Self-pay

## 2014-08-16 DIAGNOSIS — R198 Other specified symptoms and signs involving the digestive system and abdomen: Secondary | ICD-10-CM

## 2014-08-16 NOTE — Telephone Encounter (Signed)
Labs ordered .   Called the patient informed ok to come to the lab at her convenience.  Will call once resulted and she will pickup copy at our office.

## 2014-08-16 NOTE — Telephone Encounter (Signed)
The patient has recently been seen at [facility] for Abdominal/digestive problems.  They are needing to do a CT/abdomen.  They need labs prior and would like PCP to do.  BUN and Creatinine.  Advise .  Call back number for the patient is (w) [phone] and (c) [phone].

## 2014-08-16 NOTE — Telephone Encounter (Signed)
[place] for bun/cr - to robin, for 789.0

## 2014-08-23 ENCOUNTER — Other Ambulatory Visit (INDEPENDENT_AMBULATORY_CARE_PROVIDER_SITE_OTHER): Payer: Commercial Managed Care - PPO

## 2014-08-23 DIAGNOSIS — R198 Other specified symptoms and signs involving the digestive system and abdomen: Secondary | ICD-10-CM

## 2014-08-23 LAB — BUN: BUN: 18 mg/dL (ref 6–23)

## 2014-08-23 LAB — CREATININE, SERUM: CREATININE: 1.1 mg/dL (ref 0.4–1.2)

## 2014-08-28 ENCOUNTER — Telehealth: Payer: Self-pay | Admitting: Internal Medicine

## 2014-08-28 NOTE — Telephone Encounter (Signed)
Received 3 pages from [facility], sent to Dr. Jenny Reichmann. 08/28/14/ss

## 2014-09-16 ENCOUNTER — Ambulatory Visit (INDEPENDENT_AMBULATORY_CARE_PROVIDER_SITE_OTHER): Payer: Commercial Managed Care - PPO | Admitting: Physician Assistant

## 2014-09-16 ENCOUNTER — Encounter: Payer: Self-pay | Admitting: Physician Assistant

## 2014-09-16 VITALS — BP 102/74 | HR 57 | Temp 98.1°F | Resp 18 | Wt 152.4 lb

## 2014-09-16 DIAGNOSIS — J069 Acute upper respiratory infection, unspecified: Secondary | ICD-10-CM

## 2014-09-16 MED ORDER — AZITHROMYCIN 250 MG PO TABS: ORAL_TABLET | ORAL | Status: DC

## 2014-09-16 NOTE — Progress Notes (Signed)
Subjective:    Patient ID: Annette [name], female    DOB: 1956/04/08, 58 y.o.   MRN: [id number]  Cough This is a new problem. The current episode started in the past 7 days (4 days). The problem has been gradually worsening. The problem occurs hourly. The cough is non-productive. Associated symptoms include chills, ear congestion, a fever, headaches, nasal congestion, postnasal drip and a sore throat. Pertinent negatives include no chest pain, ear pain, heartburn, hemoptysis, myalgias, rash, rhinorrhea, shortness of breath, sweats, weight loss or wheezing. The symptoms are aggravated by lying down. Risk factors: Started while on a cruise. Treatments tried: nyquil, ibuprofen. The treatment provided mild relief. Her past medical history is significant for environmental allergies. There is no history of asthma or COPD.      Review of Systems  Constitutional: Positive for fever and chills. Negative for weight loss.  HENT: Positive for postnasal drip and sore throat. Negative for ear pain and rhinorrhea.   Respiratory: Positive for cough. Negative for hemoptysis, shortness of breath and wheezing.   Cardiovascular: Negative for chest pain.  Gastrointestinal: Negative for heartburn, nausea, vomiting and diarrhea.  Musculoskeletal: Negative for myalgias.  Skin: Negative for rash.  Allergic/Immunologic: Positive for environmental allergies.  Neurological: Positive for headaches. Negative for syncope.  All other systems reviewed and are negative.  Past Medical History  Diagnosis Date  . HYPERLIPIDEMIA 08/20/2007  . OBESITY, MILD 08/20/2007  . ANXIETY 11/10/2010  . DEPRESSION 08/20/2007  . ATTENTION DEFICIT DISORDER 11/10/2010  . Carpal tunnel syndrome 08/20/2007  . HYPERTENSION 08/20/2007  . Raynaud's syndrome 08/20/2007  . ALLERGIC RHINITIS 10/01/2007  . EMPHYSEMA 06/16/2010  . BARTHOLIN'S CYST 08/20/2007  . HOT FLASHES 09/26/2009  . MENOPAUSAL DISORDER 11/07/2009  . POLYARTHRALGIA 11/06/2008    . OSTEOPENIA 06/16/2010  . Insomnia, unspecified 10/01/2007  . COLONIC POLYPS, HX OF 08/20/2007  . ISCHEMIC COLITIS, HX OF 10/01/2007    History   Social History  . Marital Status: Married    Spouse Name: N/A    Number of Children: 1  . Years of Education: N/A   Occupational History  .     Social History Main Topics  . Smoking status: Former Smoker    Quit date: 12/27/1990  . Smokeless tobacco: Never Used  . Alcohol Use: Yes     Comment: one drink per day  . Drug Use: No  . Sexual Activity: Not on file   Other Topics Concern  . Not on file   Social History Narrative  . No narrative on file    Past Surgical History  Procedure Laterality Date  . Tonsillectomy      Family History  Problem Relation Age of Onset  . Osteoporosis Mother   . ADD / ADHD Daughter   . Osteoporosis Maternal Grandmother   . Breast cancer Maternal Grandmother   . Coronary artery disease Other   . Hypertension Other   . Prostate cancer Other   . Colon cancer Neg Hx     No Known Allergies  Current Outpatient Prescriptions on File Prior to Visit  Medication Sig Dispense Refill  . aspirin EC 81 MG tablet Take 1 tablet (81 mg total) by mouth daily.  90 tablet  11  . atorvastatin (LIPITOR) 10 MG tablet Take 1 tablet (10 mg total) by mouth daily.  90 tablet  3  . cyclobenzaprine (FLEXERIL) 10 MG tablet Take 10 mg by mouth 3 (three) times daily as needed for muscle spasms.      Marland Kitchen  Estradiol (VAGIFEM) 10 MCG TABS vaginal tablet Place 10 mcg vaginally 2 (two) times a week.      Marland Kitchen HYDROcodone-acetaminophen (NORCO/VICODIN) 5-325 MG per tablet Take 1 tablet by mouth every 6 (six) hours as needed.  10 tablet  0  . ibuprofen (ADVIL,MOTRIN) 200 MG tablet Take 400 mg by mouth every 6 (six) hours as needed for moderate pain.      . traMADol (ULTRAM) 50 MG tablet Take 50 mg by mouth every 6 (six) hours as needed for moderate pain.      Marland Kitchen zolpidem (AMBIEN) 10 MG tablet Take 10 mg by mouth at bedtime as needed  for sleep.       No current facility-administered medications on file prior to visit.    EXAM: BP 102/74  Pulse 57  Temp(Src) 98.1 F (36.7 C) (Oral)  Resp 18  Wt 152 lb 6.4 oz (69.128 kg)  SpO2 96%     Objective:   Physical Exam  Nursing note and vitals reviewed. Constitutional: She is oriented to person, place, and time. She appears well-developed and well-nourished. No distress.  HENT:  Head: Normocephalic and atraumatic.  Right Ear: External ear normal.  Left Ear: External ear normal.  Nose: Nose normal.  Mouth/Throat: Oropharynx is clear and moist. No oropharyngeal exudate.  Bilateral TMs normal. Bilateral frontal and maxillary sinuses non-TTP.  Eyes: Conjunctivae and EOM are normal. Pupils are equal, round, and reactive to light.  Neck: Normal range of motion. Neck supple.  Cardiovascular: Normal rate, regular rhythm and intact distal pulses.   Pulmonary/Chest: Effort normal and breath sounds normal. No stridor. No respiratory distress. She has no wheezes. She has no rales. She exhibits no tenderness.  Lymphadenopathy:    She has no cervical adenopathy.  Neurological: She is alert and oriented to person, place, and time.  Skin: Skin is warm and dry. No rash noted. She is not diaphoretic. No erythema. No pallor.  Psychiatric: She has a normal mood and affect. Her behavior is normal. Judgment and thought content normal.     Lab Results  Component Value Date   WBC 8.6 06/10/2014   HGB 12.4 06/10/2014   HCT 38.1 06/10/2014   PLT 214 06/10/2014   GLUCOSE 125* 06/10/2014   CHOL 203* 12/11/2013   TRIG 93.0 12/11/2013   HDL 56.20 12/11/2013   LDLDIRECT 137.0 12/11/2013   LDLCALC 117* 11/15/2012   ALT 16 06/10/2014   AST 28 06/10/2014   NA 137 06/10/2014   K 4.2 06/10/2014   CL 99 06/10/2014   CREATININE 1.1 08/23/2014   BUN 18 08/23/2014   CO2 22 06/10/2014   TSH 1.71 12/11/2013        Assessment & Plan:  Ramiya was seen today for cough.  Diagnoses and associated  orders for this visit:  Acute upper respiratory infections of unspecified site Comments: Symptomatic treatment with OTC Mucinex, nasal steroid, antihistamine, rest, push fluids. Watchful waiting. - azithromycin (ZITHROMAX) 250 MG tablet; 2 tabs on day 1 followed by 1 tablet days 2 to 5.    Discussed with patient regarding antibiotic non-use and that she probably does not need an antibiotic at this time. Despite this, the patient requested antibiotic anyway. Provided printed Rx for Z-Pak to be filled if patient is not feeling any better by Thursday despite symptomatic treatment. Patient is amenable to this plan.  Return precautions provided, and patient handout on upper respiratory infection.  Plan to follow up as needed, or for worsening or persistent symptoms despite  treatment.  Patient Instructions  Plain Over the Counter Mucinex or Mucinex DM for thick secretions.  Sudafed to help with congestion.  Force NON dairy fluids, drinking plenty of water is best.    Over the Counter Flonase OR Nasacort AQ 1 spray in each nostril twice a day as needed. Use the "crossover" technique into opposite nostril spraying toward opposite ear @ 45 degree angle, not straight up into nostril.   Plain Over the Counter Allegra (NOT D )  160 daily , OR Loratidine 10 mg , OR Zyrtec 10 mg @ bedtime  as needed for itchy eyes & sneezing.  Saline Irrigation and Saline Sprays can also help reduce symptoms.  If you're not better by the weekend, Azithromycin as directed.  If emergency symptoms discussed during visit developed, seek medical attention immediately.  Followup as needed, or for worsening or persistent symptoms despite treatment.

## 2014-09-16 NOTE — Patient Instructions (Addendum)
Plain Over the Counter Mucinex or Mucinex DM for thick secretions.  Sudafed to help with congestion.  Force NON dairy fluids, drinking plenty of water is best.    Over the Counter Flonase OR Nasacort AQ 1 spray in each nostril twice a day as needed. Use the "crossover" technique into opposite nostril spraying toward opposite ear @ 45 degree angle, not straight up into nostril.   Plain Over the Counter Allegra (NOT D )  160 daily , OR Loratidine 10 mg , OR Zyrtec 10 mg @ bedtime  as needed for itchy eyes & sneezing.  Saline Irrigation and Saline Sprays can also help reduce symptoms.  If you're not better by the weekend, Azithromycin as directed.  If emergency symptoms discussed during visit developed, seek medical attention immediately.  Followup as needed, or for worsening or persistent symptoms despite treatment.      Upper Respiratory Infection, Adult An upper respiratory infection (URI) is also known as the common cold. It is often caused by a type of germ (virus). Colds are easily spread (contagious). You can pass it to others by kissing, coughing, sneezing, or drinking out of the same glass. Usually, you get better in 1 or 2 weeks.  HOME CARE   Only take medicine as told by your doctor.  Use a warm mist humidifier or breathe in steam from a hot shower.  Drink enough water and fluids to keep your pee (urine) clear or pale yellow.  Get plenty of rest.  Return to work when your temperature is back to normal or as told by your doctor. You may use a face mask and wash your hands to stop your cold from spreading. GET HELP RIGHT AWAY IF:   After the first few days, you feel you are getting worse.  You have questions about your medicine.  You have chills, shortness of breath, or brown or red spit (mucus).  You have yellow or brown snot (nasal discharge) or pain in the face, especially when you bend forward.  You have a fever, puffy (swollen) neck, pain when you swallow, or  white spots in the back of your throat.  You have a bad headache, ear pain, sinus pain, or chest pain.  You have a high-pitched whistling sound when you breathe in and out (wheezing).  You have a lasting cough or cough up blood.  You have sore muscles or a stiff neck. MAKE SURE YOU:   Understand these instructions.  Will watch your condition.  Will get help right away if you are not doing well or get worse. Document Released: 05/31/2008 Document Revised: 03/06/2012 Document Reviewed: 03/20/2014 [facility] Patient Information 2015 [place], [place]. This information is not intended to replace advice given to you by your health care provider. Make sure you discuss any questions you have with your health care provider.

## 2014-09-16 NOTE — Progress Notes (Signed)
Pre visit review using our clinic review tool, if applicable. No additional management support is needed unless otherwise documented below in the visit note. 

## 2014-10-10 ENCOUNTER — Ambulatory Visit: Payer: Commercial Managed Care - PPO | Admitting: Family Medicine

## 2015-01-08 ENCOUNTER — Other Ambulatory Visit (INDEPENDENT_AMBULATORY_CARE_PROVIDER_SITE_OTHER): Payer: Commercial Managed Care - PPO

## 2015-01-08 ENCOUNTER — Encounter: Payer: Self-pay | Admitting: Internal Medicine

## 2015-01-08 ENCOUNTER — Ambulatory Visit (INDEPENDENT_AMBULATORY_CARE_PROVIDER_SITE_OTHER): Payer: Commercial Managed Care - PPO | Admitting: Internal Medicine

## 2015-01-08 VITALS — BP 110/80 | HR 58 | Temp 97.6°F | Ht 63.25 in | Wt 157.6 lb

## 2015-01-08 DIAGNOSIS — M79671 Pain in right foot: Secondary | ICD-10-CM

## 2015-01-08 DIAGNOSIS — R739 Hyperglycemia, unspecified: Secondary | ICD-10-CM

## 2015-01-08 DIAGNOSIS — M79672 Pain in left foot: Secondary | ICD-10-CM

## 2015-01-08 DIAGNOSIS — Z0000 Encounter for general adult medical examination without abnormal findings: Secondary | ICD-10-CM

## 2015-01-08 LAB — CBC WITH DIFFERENTIAL/PLATELET
BASOS ABS: 0 10*3/uL (ref 0.0–0.1)
Basophils Relative: 0.4 % (ref 0.0–3.0)
Eosinophils Absolute: 0.1 10*3/uL (ref 0.0–0.7)
Eosinophils Relative: 1.3 % (ref 0.0–5.0)
HCT: 40 % (ref 36.0–46.0)
HEMOGLOBIN: 13.2 g/dL (ref 12.0–15.0)
LYMPHS PCT: 37.9 % (ref 12.0–46.0)
Lymphs Abs: 3.1 10*3/uL (ref 0.7–4.0)
MCHC: 32.9 g/dL (ref 30.0–36.0)
MCV: 86.1 fl (ref 78.0–100.0)
Monocytes Absolute: 0.4 10*3/uL (ref 0.1–1.0)
Monocytes Relative: 4.4 % (ref 3.0–12.0)
Neutro Abs: 4.6 10*3/uL (ref 1.4–7.7)
Neutrophils Relative %: 56 % (ref 43.0–77.0)
PLATELETS: 211 10*3/uL (ref 150.0–400.0)
RBC: 4.65 Mil/uL (ref 3.87–5.11)
RDW: 14.2 % (ref 11.5–15.5)
WBC: 8.3 10*3/uL (ref 4.0–10.5)

## 2015-01-08 LAB — BASIC METABOLIC PANEL
BUN: 19 mg/dL (ref 6–23)
CHLORIDE: 106 meq/L (ref 96–112)
CO2: 25 meq/L (ref 19–32)
Calcium: 10 mg/dL (ref 8.4–10.5)
Creatinine, Ser: 1.1 mg/dL (ref 0.40–1.20)
GFR: 66 mL/min (ref >60.00)
Glucose, Bld: 95 mg/dL (ref 70–99)
POTASSIUM: 4.3 meq/L (ref 3.5–5.1)
SODIUM: 140 meq/L (ref 135–145)

## 2015-01-08 LAB — URINALYSIS, ROUTINE W REFLEX MICROSCOPIC
BILIRUBIN URINE: NEGATIVE
Hgb urine dipstick: NEGATIVE
KETONES UR: NEGATIVE
Nitrite: NEGATIVE
Specific Gravity, Urine: 1.015 (ref 1.000–1.030)
Total Protein, Urine: NEGATIVE
Urine Glucose: NEGATIVE
Urobilinogen, UA: 0.2 (ref 0.0–1.0)
pH: 6 (ref 5.0–8.0)

## 2015-01-08 LAB — LIPID PANEL
Cholesterol: 159 mg/dL (ref 0–200)
HDL: 63.1 mg/dL (ref >39.00)
LDL Cholesterol: 78 mg/dL (ref 0–99)
NonHDL: 95.9
Total CHOL/HDL Ratio: 3
Triglycerides: 89 mg/dL (ref 0.0–149.0)
VLDL: 17.8 mg/dL (ref 0.0–40.0)

## 2015-01-08 LAB — HEPATIC FUNCTION PANEL
ALBUMIN: 4.2 g/dL (ref 3.5–5.2)
ALK PHOS: 62 U/L (ref 39–117)
ALT: 18 U/L (ref 0–35)
AST: 21 U/L (ref 0–37)
Bilirubin, Direct: 0 mg/dL (ref 0.0–0.3)
Total Bilirubin: 0.3 mg/dL (ref 0.2–1.2)
Total Protein: 7.2 g/dL (ref 6.0–8.3)

## 2015-01-08 LAB — TSH: TSH: 2.13 u[IU]/mL (ref 0.35–4.50)

## 2015-01-08 MED ORDER — ATORVASTATIN CALCIUM 10 MG PO TABS: 10.0000 mg | ORAL_TABLET | Freq: Every day | ORAL | Status: DC

## 2015-01-08 MED ORDER — ZOLPIDEM TARTRATE 10 MG PO TABS: 10.0000 mg | ORAL_TABLET | Freq: Every evening | ORAL | Status: DC | PRN

## 2015-01-08 MED ORDER — TRAMADOL HCL 50 MG PO TABS: 50.0000 mg | ORAL_TABLET | Freq: Four times a day (QID) | ORAL | Status: DC | PRN

## 2015-01-08 NOTE — Progress Notes (Signed)
Subjective:    Patient ID: Annette [name], female    DOB: 14-Jan-1956, 59 y.o.   MRN: [id number]  HPI Here for wellness and f/u;  Overall doing ok;  Pt denies CP, worsening SOB, DOE, wheezing, orthopnea, PND, worsening LE edema, palpitations, dizziness or syncope.  Pt denies neurological change such as new headache, facial or extremity weakness.  Pt denies polydipsia, polyuria, or low sugar symptoms. Pt states overall good compliance with treatment and medications, good tolerability, and has been trying to follow lower cholesterol diet.  Pt denies worsening depressive symptoms, suicidal ideation or panic. No fever, night sweats, wt loss, loss of appetite, or other constitutional symptoms.  Pt states good ability with ADL's, has low fall risk, home safety reviewed and adequate, no other significant changes in hearing or vision, and only occasionally active with exercise, due to recurring bilat plantar  Fasciitis. Also with bilat feet pain, and right instep apin, aks for referral sport med Past Medical History  Diagnosis Date  . HYPERLIPIDEMIA 08/20/2007  . OBESITY, MILD 08/20/2007  . ANXIETY 11/10/2010  . DEPRESSION 08/20/2007  . ATTENTION DEFICIT DISORDER 11/10/2010  . Carpal tunnel syndrome 08/20/2007  . HYPERTENSION 08/20/2007  . Raynaud's syndrome 08/20/2007  . ALLERGIC RHINITIS 10/01/2007  . EMPHYSEMA 06/16/2010  . BARTHOLIN'S CYST 08/20/2007  . HOT FLASHES 09/26/2009  . MENOPAUSAL DISORDER 11/07/2009  . POLYARTHRALGIA 11/06/2008  . OSTEOPENIA 06/16/2010  . Insomnia, unspecified 10/01/2007  . COLONIC POLYPS, HX OF 08/20/2007  . ISCHEMIC COLITIS, HX OF 10/01/2007   Past Surgical History  Procedure Laterality Date  . Tonsillectomy      reports that she quit smoking about 24 years ago. She has never used smokeless tobacco. She reports that she drinks alcohol. She reports that she does not use illicit drugs. family history includes ADD / ADHD in her daughter; Breast cancer in her maternal  grandmother; Coronary artery disease in her other; Hypertension in her other; Osteoporosis in her maternal grandmother and mother; Prostate cancer in her other. There is no history of Colon cancer. No Known Allergies Current Outpatient Prescriptions on File Prior to Visit  Medication Sig Dispense Refill  . aspirin EC 81 MG tablet Take 1 tablet (81 mg total) by mouth daily. 90 tablet 11  . cyclobenzaprine (FLEXERIL) 10 MG tablet Take 10 mg by mouth 3 (three) times daily as needed for muscle spasms.    . Estradiol (VAGIFEM) 10 MCG TABS vaginal tablet Place 10 mcg vaginally 2 (two) times a week.    Marland Kitchen ibuprofen (ADVIL,MOTRIN) 200 MG tablet Take 400 mg by mouth every 6 (six) hours as needed for moderate pain.     No current facility-administered medications on file prior to visit.    Review of Systems Constitutional: Negative for increased diaphoresis, other activity, appetite or other siginficant weight change  HENT: Negative for worsening hearing loss, ear pain, facial swelling, mouth sores and neck stiffness.   Eyes: Negative for other worsening pain, redness or visual disturbance.  Respiratory: Negative for shortness of breath and wheezing.   Cardiovascular: Negative for chest pain and palpitations.  Gastrointestinal: Negative for diarrhea, blood in stool, abdominal distention or other pain Genitourinary: Negative for hematuria, flank pain or change in urine volume.  Musculoskeletal: Negative for myalgias or other joint complaints.  Skin: Negative for color change and wound.  Neurological: Negative for syncope and numbness. other than noted Hematological: Negative for adenopathy. or other swelling Psychiatric/Behavioral: Negative for hallucinations, self-injury, decreased concentration or other worsening agitation.  Objective:   Physical Exam BP 110/80 mmHg  Pulse 58  Temp(Src) 97.6 F (36.4 C) (Oral)  Ht 5' 3.25" (1.607 m)  Wt 157 lb 9.6 oz (71.487 kg)  BMI 27.68 kg/m2 VS  noted,  Constitutional: Pt is oriented to person, place, and time. Appears well-developed and well-nourished.  Head: Normocephalic and atraumatic.  Right Ear: External ear normal.  Left Ear: External ear normal.  Nose: Nose normal.  Mouth/Throat: Oropharynx is clear and moist.  Eyes: Conjunctivae and EOM are normal. Pupils are equal, round, and reactive to light.  Neck: Normal range of motion. Neck supple. No JVD present. No tracheal deviation present.  Cardiovascular: Normal rate, regular rhythm, normal heart sounds and intact distal pulses.   Pulmonary/Chest: Effort normal and breath sounds without rales or wheezing  Abdominal: Soft. Bowel sounds are normal. NT. No HSM  Musculoskeletal: Normal range of motion. Exhibits no edema.  Lymphadenopathy:  Has no cervical adenopathy.  Neurological: Pt is alert and oriented to person, place, and time. Pt has normal reflexes. No cranial nerve deficit. Motor grossly intact Skin: Skin is warm and dry. No rash noted.  Psychiatric:  Has normal mood and affect. Behavior is normal.   Wt Readings from Last 3 Encounters:  01/08/15 157 lb 9.6 oz (71.487 kg)  09/16/14 152 lb 6.4 oz (69.128 kg)  03/23/14 157 lb (71.215 kg)       Assessment & Plan:

## 2015-01-08 NOTE — Assessment & Plan Note (Signed)

## 2015-01-08 NOTE — Progress Notes (Signed)
Pre visit review using our clinic review tool, if applicable. No additional management support is needed unless otherwise documented below in the visit note. 

## 2015-01-08 NOTE — Patient Instructions (Addendum)
Please continue all other medications as before, and refills have been done if requested.  Please have the pharmacy call with any other refills you may need.  Please continue your efforts at being more active, low cholesterol diet, and weight control.  You are otherwise up to date with prevention measures today.  Please keep your appointments with your specialists as you may have planned  You will be contacted regarding the referral for: mammogram, and sport medicine  Please go to the LAB in the Basement (turn left off the elevator) for the tests to be done today  You will be contacted by phone if any changes need to be made immediately.  Otherwise, you will receive a letter about your results with an explanation, but please check with MyChart first.  Please remember to sign up for MyChart if you have not done so, as this will be important to you in the future with finding out test results, communicating by private email, and scheduling acute appointments online when needed.  Please return in 1 year for your yearly visit, or sooner if needed, with Lab testing done 3-5 days before

## 2015-01-16 NOTE — Assessment & Plan Note (Signed)
Asympt,  Lab Results  Component Value Date   WBC 8.3 01/08/2015   HGB 13.2 01/08/2015   HCT 40.0 01/08/2015   PLT 211.0 01/08/2015   GLUCOSE 95 01/08/2015   CHOL 159 01/08/2015   TRIG 89.0 01/08/2015   HDL 63.10 01/08/2015   LDLDIRECT 137.0 12/11/2013   LDLCALC 78 01/08/2015   ALT 18 01/08/2015   AST 21 01/08/2015   NA 140 01/08/2015   K 4.3 01/08/2015   CL 106 01/08/2015   CREATININE 1.10 01/08/2015   BUN 19 01/08/2015   CO2 25 01/08/2015   TSH 2.13 01/08/2015   stable overall by history and exam, recent data reviewed with pt, and pt to continue medical treatment as before,  to f/u any worsening symptoms or concerns

## 2015-01-16 NOTE — Assessment & Plan Note (Signed)
Refer sport med per pt request

## 2015-01-21 ENCOUNTER — Ambulatory Visit (INDEPENDENT_AMBULATORY_CARE_PROVIDER_SITE_OTHER): Payer: Commercial Managed Care - PPO | Admitting: Family Medicine

## 2015-01-21 ENCOUNTER — Other Ambulatory Visit (INDEPENDENT_AMBULATORY_CARE_PROVIDER_SITE_OTHER): Payer: Commercial Managed Care - PPO

## 2015-01-21 ENCOUNTER — Encounter: Payer: Self-pay | Admitting: Family Medicine

## 2015-01-21 VITALS — BP 110/70 | HR 62 | Ht 63.5 in | Wt 158.0 lb

## 2015-01-21 DIAGNOSIS — M722 Plantar fascial fibromatosis: Secondary | ICD-10-CM | POA: Diagnosis not present

## 2015-01-21 DIAGNOSIS — M79671 Pain in right foot: Secondary | ICD-10-CM

## 2015-01-21 MED ORDER — MELOXICAM 15 MG PO TABS: 15.0000 mg | ORAL_TABLET | Freq: Every day | ORAL | Status: DC

## 2015-01-21 NOTE — Progress Notes (Signed)
[name] [address] [place], [place] [address] Phone: [phone] Subjective:    I'm seeing this patient by the request  of:  Cathlean Cower, MD   CC: Right foot pain  [id number] Annette [name] is a 59 y.o. female coming in with complaint of right foot pain. Patient states actually she has had pain in both of her feet. Patient states the right side is worse. States his been going on multiple months. Patient states that it seemed to be secondary to her running. Patient states that now it seems to be all the time. States that it is worse in the mornings and after sitting a long amount of time. Patient points that is on both the medial and lateral aspects of the heel. Patient states that when she starts walking it seems to improve. Patient has tried changing shoes as well as decreasing running with minimal improvement. Patient does state that anti-inflammatories does help. Patient denies any numbness or tingling or any weakness of the foot. Denies any associated back pain. Patient would like to remain active and continue her 9 minute mile pace which she is finding difficulty at this time. Patient states 5-6 years ago she did have plantar fasciitis and states that this feels anteriorly similar.      Past medical history, social, surgical and family history all reviewed in electronic medical record.   Review of Systems: No headache, visual changes, nausea, vomiting, diarrhea, constipation, dizziness, abdominal pain, skin rash, fevers, chills, night sweats, weight loss, swollen lymph nodes, body aches, joint swelling, muscle aches, chest pain, shortness of breath, mood changes.   Objective Blood pressure 110/70, pulse 62, height 5' 3.5" (1.613 m), weight 158 lb (71.668 kg), SpO2 96 %.  General: No apparent distress alert and oriented x3 mood and affect normal, dressed appropriately.  HEENT: Pupils equal, extraocular movements intact  Respiratory: Patient's speak  in full sentences and does not appear short of breath  Cardiovascular: No lower extremity edema, non tender, no erythema  Skin: Warm dry intact with no signs of infection or rash on extremities or on axial skeleton.  Abdomen: Soft nontender  Neuro: Cranial nerves II through XII are intact, neurovascularly intact in all extremities with 2+ DTRs and 2+ pulses.  Lymph: No lymphadenopathy of posterior or anterior cervical chain or axillae bilaterally.  Gait normal with good balance and coordination.  MSK:  Non tender with full range of motion and good stability and symmetric strength and tone of shoulders, elbows, wrist, hip, knee and ankles bilaterally.  Foot exam shows patient does have a bunion and bunionette formation on the right toe with some mild fibular deviation compared to the contralateral side. In addition to this patient does have breakdown of transverse and longitudinal arches bilaterally mostly on the right side. By very mild overpronation of the hindfoot noted.  MSK US performed of: Right This study was ordered, performed, and interpreted by Charlann Boxer D.O.  Foot/Ankle:   All structures visualized.   Talar dome unremarkable  Ankle mortise without effusion. Peroneus longus and brevis tendons unremarkable on long and transverse views without sheath effusions. Posterior tibialis, flexor hallucis longus, and flexor digitorum longus tendons unremarkable on long and transverse views without sheath effusions. Achilles tendon visualized along length of tendon and unremarkable on long and transverse views without sheath effusion. Anterior Talofibular Ligament and Calcaneofibular Ligaments unremarkable and intact. Deltoid Ligament unremarkable and intact. Plantar fascia intact but is significantly larger 1.19 cm compared normal  of 0.7 cm. Power doppler signal normal.  IMPRESSION: Plantar fasciitis of the right foot  Procedure note 97110; 15 minutes spent for Therapeutic exercises as  stated in above notes.  This included exercises focusing on stretching, strengthening, with significant focus on eccentric aspects.   Proper technique shown and discussed handout in great detail with ATC.  All questions were discussed and answered.     Impression and Recommendations:     This case required medical decision making of moderate complexity.

## 2015-01-21 NOTE — Patient Instructions (Signed)
Very nice to meet you Ice bath 20 minutes at night Exercises nightly Good shoes with rigid bottom.  Jalene Mullet, Merrell or New balance greater then 700 Stretch stretch stretch Spenco orthotics online look for "total Support" Meloxicam daily for 10 days then as needed Vitamin D 2000 IU daily Turmeric 500mg  twice daily.  See me again in 3-4 weeks.

## 2015-01-21 NOTE — Assessment & Plan Note (Signed)
Patient does have moderate plantar fasciitis. Patient will do a oral anti-inflammatories, over-the-counter natural supplementations, home exercises and icing protocol. Patient did work with a Product/process development scientist today and great detail. Patient will try to make these different changes and we discussed proper shoe choices. Patient will come back and see me again in 3-4 weeks for further evaluation and treatment.

## 2015-01-21 NOTE — Progress Notes (Signed)
Pre visit review using our clinic review tool, if applicable. No additional management support is needed unless otherwise documented below in the visit note. 

## 2015-02-18 ENCOUNTER — Ambulatory Visit: Payer: Commercial Managed Care - PPO | Admitting: Family Medicine

## 2015-03-18 ENCOUNTER — Ambulatory Visit: Payer: Commercial Managed Care - PPO | Admitting: Family Medicine

## 2015-04-18 ENCOUNTER — Encounter: Payer: Self-pay | Admitting: Internal Medicine

## 2015-04-23 ENCOUNTER — Ambulatory Visit: Payer: Commercial Managed Care - PPO | Admitting: Family Medicine

## 2015-06-12 ENCOUNTER — Ambulatory Visit: Payer: Commercial Managed Care - PPO | Admitting: Family Medicine

## 2015-06-24 ENCOUNTER — Ambulatory Visit (INDEPENDENT_AMBULATORY_CARE_PROVIDER_SITE_OTHER): Payer: Commercial Managed Care - PPO | Admitting: Family Medicine

## 2015-06-24 ENCOUNTER — Encounter: Payer: Self-pay | Admitting: Family Medicine

## 2015-06-24 VITALS — BP 112/70 | HR 48 | Ht 63.5 in | Wt 155.0 lb

## 2015-06-24 DIAGNOSIS — M722 Plantar fascial fibromatosis: Secondary | ICD-10-CM

## 2015-06-24 DIAGNOSIS — M533 Sacrococcygeal disorders, not elsewhere classified: Secondary | ICD-10-CM | POA: Insufficient documentation

## 2015-06-24 MED ORDER — MELOXICAM 15 MG PO TABS: 15.0000 mg | ORAL_TABLET | Freq: Every day | ORAL | Status: DC

## 2015-06-24 NOTE — Progress Notes (Signed)
Pre visit review using our clinic review tool, if applicable. No additional management support is needed unless otherwise documented below in the visit note. 

## 2015-06-24 NOTE — Assessment & Plan Note (Signed)
Patient does have more of a sacroiliac joint dysfunction. I do not believe the patient has any radicular symptoms. Patient does have tramadol for break through pain. Patient given home exercises and work with athletic trainer today to learn them in greater detail. We discussed icing regimen as well as topical inflammatory some patient will be taking oral anti-and plan towards. Patient and will come back and see me again in 3-4 weeks.

## 2015-06-24 NOTE — Patient Instructions (Addendum)
Good to see you Alvera Singh is your friend.  Sacroiliac Joint Mobilization and Rehab 1. Work on pretzel stretching, shoulder back and leg draped in front. 3-5 sets, 30 sec.. 2. hip abductor rotations. standing, hip flexion and rotation outward then inward. 3 sets, 15 reps. when can do comfortably, add ankle weights starting at 2 pounds.  3. cross over stretching - shoulder back to ground, same side leg crossover. 3-5 sets for 30 min..  4. rolling up and back knees to chest and rocking. 5. sacral tilt - 5 sets, hold for 5-10 seconds Exercises on wall.  Heel and butt touching.  Raise leg 6 inches and hold 2 seconds.  Down slow for count of 4 seconds.  1 set of 30 reps daily on both sides.  Physical therapy will be calling you We can do injection in foot if worsens.  Bump up vitamin D to 4000 IU daily for 2 weeks then 2000 IU daily thereafter.  See me again in 4-6 weeks.

## 2015-06-24 NOTE — Assessment & Plan Note (Signed)
Seems to be improving at this time. Discussed HEP, icing, discussed possible injection with patient declined. Patient can do oral anti-implant reason refill was given today.

## 2015-06-24 NOTE — Progress Notes (Signed)
[name] [address] [place], [place] [address] Phone: [phone] Subjective:    I'm seeing this patient by the request  of:  Cathlean Cower, MD   CC: Right foot pain  MPN:[id number] Annette [name] is a 59 y.o. female coming in with complaint of right foot pain. Patient was found to have more of a plantar fasciitis on the right foot. We discussed home exercises, icing protocol, as well as over-the-counter natural supplementations. Patient worked with Product/process development scientist last time. Patient states foot pain is approximately 75% better. Patient has not been seen for 5 months. Patient still states that in the morning it is some tightness but states that during the daily activities it seems to be better.  She does complain of a new problem. Patient states it is more of a back pain. Left-sided. Seems to be more radicular symptoms going on the posterior aspect of her leg. States that the pain as 5 out of 10 in severity. Stopping her from exercising on a regular basis. Has noticed more muscle tightness since she's been taking the Lipitor. Denies any weakness of the lower extremity. Denies any nighttime awakening. She has had a workup for this back in 2010 which had an MRI of the lumbar spine which was unremarkable.     Past medical history, social, surgical and family history all reviewed in electronic medical record.   Review of Systems: No headache, visual changes, nausea, vomiting, diarrhea, constipation, dizziness, abdominal pain, skin rash, fevers, chills, night sweats, weight loss, swollen lymph nodes, body aches, joint swelling, muscle aches, chest pain, shortness of breath, mood changes.   Objective Blood pressure 112/70, pulse 48, height 5' 3.5" (1.613 m), weight 155 lb (70.308 kg), SpO2 99 %.  General: No apparent distress alert and oriented x3 mood and affect normal, dressed appropriately.  HEENT: Pupils equal, extraocular movements intact  Respiratory:  Patient's speak in full sentences and does not appear short of breath  Cardiovascular: No lower extremity edema, non tender, no erythema  Skin: Warm dry intact with no signs of infection or rash on extremities or on axial skeleton.  Abdomen: Soft nontender  Neuro: Cranial nerves II through XII are intact, neurovascularly intact in all extremities with 2+ DTRs and 2+ pulses.  Lymph: No lymphadenopathy of posterior or anterior cervical chain or axillae bilaterally.  Gait normal with good balance and coordination.  MSK:  Non tender with full range of motion and good stability and symmetric strength and tone of shoulders, elbows, wrist, hip, knee and ankles bilaterally.  Foot exam shows patient does have a bunion and bunionette formation on the right toe with some mild fibular deviation compared to the contralateral side. In addition to this patient does have breakdown of transverse and longitudinal arches bilaterally mostly on the right side. By very mild overpronation of the hindfoot noted. Nontender on exam  Back Exam:  Inspection: Unremarkable  Motion: Flexion 45 deg, Extension 45 deg, Side Bending to 35 deg bilaterally,  Rotation to 35 deg bilaterally  SLR laying: Negative  XSLR laying: Negative  Palpable tenderness: Tender to palpation FABER: Positive bilateral Sensory change: Gross sensation intact to all lumbar and sacral dermatomes.  Reflexes: 2+ at both patellar tendons, 2+ at achilles tendons, Babinski's downgoing.  Strength at foot  Plantar-flexion: 5/5 Dorsi-flexion: 5/5 Eversion: 5/5 Inversion: 5/5  Leg strength  Quad: 5/5 Hamstring: 5/5 Hip flexor: 5/5 Hip abductors: 4/5  Gait unremarkable.   MSK US performed of: Right This  study was ordered, performed, and interpreted by Charlann Boxer D.O.  Foot/Ankle:   All structures visualized.   Talar dome unremarkable  Plantar fascia smaller at 0.8 cm from 1.19cm  Power doppler signal normal.  IMPRESSION: Plantar fasciitis of the  right foot improved.    Procedure note 35789; 15 minutes spent for Therapeutic exercises as stated in above notes.  This included exercises focusing on stretching, strengthening, with significant focus on eccentric aspects.   Sacroiliac Joint Mobilization and Rehab 1. Work on pretzel stretching, shoulder back and leg draped in front. 3-5 sets, 30 sec.. 2. hip abductor rotations. standing, hip flexion and rotation outward then inward. 3 sets, 15 reps. when can do comfortably, add ankle weights starting at 2 pounds.  3. cross over stretching - shoulder back to ground, same side leg crossover. 3-5 sets for 30 min..  4. rolling up and back knees to chest and rocking. 5. sacral tilt - 5 sets, hold for 5-10 seconds Proper technique shown and discussed handout in great detail with ATC.  All questions were discussed and answered.      Impression and Recommendations:     This case required medical decision making of moderate complexity.

## 2015-08-07 ENCOUNTER — Ambulatory Visit: Payer: Commercial Managed Care - PPO | Admitting: Family Medicine

## 2015-10-02 ENCOUNTER — Ambulatory Visit: Payer: Commercial Managed Care - PPO | Admitting: Family Medicine

## 2015-11-12 ENCOUNTER — Ambulatory Visit: Payer: Commercial Managed Care - PPO | Admitting: Family Medicine

## 2015-12-19 ENCOUNTER — Encounter: Payer: Self-pay | Admitting: Family Medicine

## 2015-12-19 ENCOUNTER — Ambulatory Visit (INDEPENDENT_AMBULATORY_CARE_PROVIDER_SITE_OTHER): Payer: Commercial Managed Care - PPO | Admitting: Family Medicine

## 2015-12-19 ENCOUNTER — Other Ambulatory Visit (INDEPENDENT_AMBULATORY_CARE_PROVIDER_SITE_OTHER): Payer: Commercial Managed Care - PPO

## 2015-12-19 VITALS — BP 108/72 | HR 54 | Ht 63.0 in | Wt 160.0 lb

## 2015-12-19 DIAGNOSIS — M9904 Segmental and somatic dysfunction of sacral region: Secondary | ICD-10-CM | POA: Diagnosis not present

## 2015-12-19 DIAGNOSIS — M25562 Pain in left knee: Secondary | ICD-10-CM

## 2015-12-19 DIAGNOSIS — M2550 Pain in unspecified joint: Secondary | ICD-10-CM | POA: Diagnosis not present

## 2015-12-19 DIAGNOSIS — M9903 Segmental and somatic dysfunction of lumbar region: Secondary | ICD-10-CM | POA: Diagnosis not present

## 2015-12-19 DIAGNOSIS — M533 Sacrococcygeal disorders, not elsewhere classified: Secondary | ICD-10-CM

## 2015-12-19 DIAGNOSIS — M9901 Segmental and somatic dysfunction of cervical region: Secondary | ICD-10-CM

## 2015-12-19 DIAGNOSIS — M999 Biomechanical lesion, unspecified: Secondary | ICD-10-CM | POA: Insufficient documentation

## 2015-12-19 MED ORDER — MELOXICAM 15 MG PO TABS: 15.0000 mg | ORAL_TABLET | Freq: Every day | ORAL | Status: DC

## 2015-12-19 MED ORDER — TRAMADOL HCL 50 MG PO TABS: 50.0000 mg | ORAL_TABLET | Freq: Four times a day (QID) | ORAL | Status: DC | PRN

## 2015-12-19 NOTE — Assessment & Plan Note (Signed)
Responded well to osteopathic manipulation today. If continuing have pain I would consider possibly working up for other autoimmune diseases again. We may also consider checking patient's iron with history of iron deficiency. We'll discuss at follow-up in 3-4 weeks.

## 2015-12-19 NOTE — Progress Notes (Signed)
Pre visit review using our clinic review tool, if applicable. No additional management support is needed unless otherwise documented below in the visit note. 

## 2015-12-19 NOTE — Assessment & Plan Note (Signed)
Decision today to treat with OMT was based on Physical Exam  After verbal consent patient was treated with HVLA, ME, FPR techniques in cervical, thoracic, lumbar and sacral areas  Patient tolerated the procedure well with improvement in symptoms  Patient given exercises, stretches and lifestyle modifications  See medications in patient instructions if given  Patient will follow up in 3-4 weeks  

## 2015-12-19 NOTE — Assessment & Plan Note (Signed)
Responded very well to manipulation today. We discussed icing regimen and home exercises. We discussed which activities to do an which was to avoid certain lifting activities. We discussed ergonomics and posture throughout the day as well as proper lifting mechanics. Patient will come back and see me again in 3-4 weeks and continue with manipulation as long as it is helping.

## 2015-12-19 NOTE — Progress Notes (Signed)
[name] [address] [place], [place] [address] Phone: [phone] Subjective:    I'm seeing this patient by the request  of:  Cathlean Cower, MD   CC: Left knee pain and moderate back pain  [id number] Annette [name] is a 59 y.o. female coming in with complaint of left knee pain. Patient was on a stepper machine at the gym. Patient states very active. States when she got off that machine she had some discomfort mostly on the medial aspect of the knee. Seem to be a little more posterior. No swelling, no numbness. Was very stiff the entire day. Woke up this morning and felt somewhat better. Patient continues to have some mild discomfort and notices she is walking with a limp. Has never injured this previously. Has not taken any medications for it or tried any other home modalities. Rates the severity of pain as 4 out of 10 today. Did not cause any nighttime awakening.  Patient is also complaining of low back pain. Has been more of a chronic issue. More of a dull throbbing aching sensation. Has been associated previously with some radiculopathy but no longer having them. States though that going down to pick something up or full extension gives him some discomfort. Patient states when she is sitting at work it seems to be worse. No radiation down the legs any numbness or weakness.      Past medical history, social, surgical and family history all reviewed in electronic medical record.   Review of Systems: No headache, visual changes, nausea, vomiting, diarrhea, constipation, dizziness, abdominal pain, skin rash, fevers, chills, night sweats, weight loss, swollen lymph nodes, body aches, joint swelling, muscle aches, chest pain, shortness of breath, mood changes.   Objective Blood pressure 108/72, pulse 54, height 5\' 3"  (1.6 m), weight 160 lb (72.576 kg), SpO2 98 %.  General: No apparent distress alert and oriented x3 mood and affect normal, dressed  appropriately.  HEENT: Pupils equal, extraocular movements intact  Respiratory: Patient's speak in full sentences and does not appear short of breath  Cardiovascular: No lower extremity edema, non tender, no erythema  Skin: Warm dry intact with no signs of infection or rash on extremities or on axial skeleton.  Abdomen: Soft nontender  Neuro: Cranial nerves II through XII are intact, neurovascularly intact in all extremities with 2+ DTRs and 2+ pulses.  Lymph: No lymphadenopathy of posterior or anterior cervical chain or axillae bilaterally.  Gait normal with good balance and coordination.  MSK:  Non tender with full range of motion and good stability and symmetric strength and tone of shoulders, elbows, wrist, hip, and ankles bilaterally.  Knee: Left Normal to inspection with no erythema or effusion or obvious bony abnormalities. Palpation normal with no warmth, joint line tenderness, patellar tenderness, or condyle TTP on medial aspect of the hamstring. Tightness on hamstrings.  ROM full in flexion and extension and lower leg rotation. Ligaments with solid consistent endpoints including ACL, PCL, LCL, MCL. Negative Mcmurray's, Apley's, and Thessalonian tests. Non painful patellar compression. Patellar glide with mild crepitus. Patellar and quadriceps tendons unremarkable. Hamstring and quadriceps strength is normal.  Contralateral knee unremarkable  Back Exam:  Inspection: Unremarkable  Motion: Flexion 45 deg, Extension 45 deg, Side Bending to 45 deg bilaterally,  Rotation to 45 deg bilaterally  SLR laying: Negative  XSLR laying: Negative  Palpable tenderness: Tender to palpation over the sacroiliac joints FABER: Bilaterally positive Sensory change: Gross sensation intact to all lumbar  and sacral dermatomes.  Reflexes: 2+ at both patellar tendons, 2+ at achilles tendons, Babinski's downgoing.  Strength at foot  Plantar-flexion: 5/5 Dorsi-flexion: 5/5 Eversion: 5/5 Inversion: 5/5    Leg strength  Quad: 5/5 Hamstring: 5/5 Hip flexor: 5/5 Hip abductors: 4/5  Gait unremarkable.  Osteopathic findings C2 flexed rotated and side bent left  T3 extended rotated and side bent right  L2 flexed rotated and side bent right  Sacrum left on left   Impression and Recommendations:     This case required medical decision making of moderate complexity.

## 2015-12-19 NOTE — Patient Instructions (Signed)
Good to see you Meloxicam when you need it for inflammation Tramadol with 1 tylenol for pain.  Compression sleeve to thigh could help with working out Hamstring exercises 3 times a week Exercises on wall.  Heel and butt touching.  Raise leg 6 inches and hold 2 seconds.  Down slow for count of 4 seconds.  1 set of 30 reps daily on both sides.  On wall with heels, butt shoulder and head touching for a goal of 5 minutes daily  Tennis ball between shoulder blades with sitting Happy holidays! See you again in 3 weeks for more manipulation.

## 2016-01-13 ENCOUNTER — Encounter: Payer: Self-pay | Admitting: Family Medicine

## 2016-01-13 ENCOUNTER — Ambulatory Visit (INDEPENDENT_AMBULATORY_CARE_PROVIDER_SITE_OTHER): Payer: Commercial Managed Care - PPO | Admitting: Family Medicine

## 2016-01-13 ENCOUNTER — Encounter: Payer: Self-pay | Admitting: Internal Medicine

## 2016-01-13 ENCOUNTER — Ambulatory Visit (INDEPENDENT_AMBULATORY_CARE_PROVIDER_SITE_OTHER): Payer: Commercial Managed Care - PPO | Admitting: Internal Medicine

## 2016-01-13 ENCOUNTER — Encounter: Payer: Self-pay | Admitting: *Deleted

## 2016-01-13 VITALS — BP 132/84 | HR 54 | Temp 98.6°F | Resp 20 | Ht 63.0 in | Wt 162.2 lb

## 2016-01-13 VITALS — BP 132/84 | HR 54 | Ht 63.0 in | Wt 162.0 lb

## 2016-01-13 DIAGNOSIS — I10 Essential (primary) hypertension: Secondary | ICD-10-CM | POA: Diagnosis not present

## 2016-01-13 DIAGNOSIS — Z0000 Encounter for general adult medical examination without abnormal findings: Secondary | ICD-10-CM

## 2016-01-13 DIAGNOSIS — M9903 Segmental and somatic dysfunction of lumbar region: Secondary | ICD-10-CM | POA: Diagnosis not present

## 2016-01-13 DIAGNOSIS — E785 Hyperlipidemia, unspecified: Secondary | ICD-10-CM

## 2016-01-13 DIAGNOSIS — M9904 Segmental and somatic dysfunction of sacral region: Secondary | ICD-10-CM | POA: Diagnosis not present

## 2016-01-13 DIAGNOSIS — M533 Sacrococcygeal disorders, not elsewhere classified: Secondary | ICD-10-CM | POA: Diagnosis not present

## 2016-01-13 DIAGNOSIS — M999 Biomechanical lesion, unspecified: Secondary | ICD-10-CM

## 2016-01-13 DIAGNOSIS — M9901 Segmental and somatic dysfunction of cervical region: Secondary | ICD-10-CM | POA: Diagnosis not present

## 2016-01-13 MED ORDER — ESTRADIOL 10 MCG VA TABS: 10.0000 ug | ORAL_TABLET | VAGINAL | Status: DC

## 2016-01-13 NOTE — Assessment & Plan Note (Signed)
stable overall by history and exam, recent data reviewed with pt, and pt to continue medical treatment as before,  to f/u any worsening symptoms or concerns BP Readings from Last 3 Encounters:  01/13/16 132/84  12/19/15 108/72  06/24/15 112/70

## 2016-01-13 NOTE — Assessment & Plan Note (Signed)

## 2016-01-13 NOTE — Patient Instructions (Signed)
OK to hold on taking the Lipitor for the next month  Please follow a lower cholesterol diet  You will be contacted regarding the referral for: Dietary  Please continue all other medications as before, and refills have been done if requested.  Please have the pharmacy call with any other refills you may need.  Please continue your efforts at being more active, low cholesterol diet, and weight control.  You are otherwise up to date with prevention measures today.  Please keep your appointments with your specialists as you may have planned  Please go to the LAB in the Basement (turn left off the elevator) for the tests to be done IN ONE MONTH  You will be contacted by phone if any changes need to be made immediately.  Otherwise, you will receive a letter about your results with an explanation, but please check with MyChart first.  Please remember to sign up for MyChart if you have not done so, as this will be important to you in the future with finding out test results, communicating by private email, and scheduling acute appointments online when needed.  Please return in 1 year for your yearly visit, or sooner if needed, with Lab testing done 3-5 days before

## 2016-01-13 NOTE — Assessment & Plan Note (Addendum)
Encouraged patient to continue home exercises, icing protocol, which activities to do an which was potentially avoid. We discussed the importance of core strengthening exercises. Patient is going to come back and see me again in 4-6 weeks for further evaluation and treatment. Also wrote patient a note for recommendation of ergonomic evaluation as well as standing desk.

## 2016-01-13 NOTE — Progress Notes (Signed)
Pre visit review using our clinic review tool, if applicable. No additional management support is needed unless otherwise documented below in the visit note. 

## 2016-01-13 NOTE — Progress Notes (Signed)
[name] [place] [place], [place] [address] Phone: [phone] Subjective:    I'm seeing this patient by the request  of:  Cathlean Cower, MD   CC: Left knee pain and moderate back pain f/u   [id number] Annette [name] is a 60 y.o. female coming in with complaint of left knee pain. Patient was found to have more hamstring tightness. Did not seem to be any intra-articular pathology of the knee. Patient was given hamstring stretches, and discuss compression, topical anti-inflammatories as well as given  her scheduled for oral anti-inflammatories. Patient states that she has made some improvement. States that it seems to be work being the significant problem. Spent in class also causes some tenderness. Patient is not wearing a compression sleeve regularly could she felt like it did not help. Would state that she is about 60% better.  Patient is also complaining of low back pain. Has been more of a chronic issue. Patient was having some mild intermittent radicular symptoms but no significant numbness. Patient was given meloxicam as well as tramadol for breakthrough pain. Patient was to do the exercises and work on core strengthening and hip abductor strengthening. Patient states she is also about 50% better. Feels that the manipulation was beneficial. Patient continues to try to remain active. Patient states that it seems to be work related with her sitting for greater than 8 hours at a time. States that bending is significant stiffness. Has radicular symptoms going down the left leg intermittently. No weakness. Patient states that she has to get up at work multiple times to alleviate some of the discomfort.  Past mental history patient back includes having MRIs over independently visualized by me. These MRIs her back in 2010. Showed very minimal disc protrusions T6-T9 and fairly unremarkable lumbar MRI.    Past Medical History  Diagnosis Date  . HYPERLIPIDEMIA  08/20/2007  . OBESITY, MILD 08/20/2007  . ANXIETY 11/10/2010  . DEPRESSION 08/20/2007  . ATTENTION DEFICIT DISORDER 11/10/2010  . Carpal tunnel syndrome 08/20/2007  . HYPERTENSION 08/20/2007  . Raynaud's syndrome 08/20/2007  . ALLERGIC RHINITIS 10/01/2007  . EMPHYSEMA 06/16/2010  . BARTHOLIN'S CYST 08/20/2007  . HOT FLASHES 09/26/2009  . MENOPAUSAL DISORDER 11/07/2009  . POLYARTHRALGIA 11/06/2008  . OSTEOPENIA 06/16/2010  . Insomnia, unspecified 10/01/2007  . COLONIC POLYPS, HX OF 08/20/2007  . ISCHEMIC COLITIS, HX OF 10/01/2007   Past Surgical History  Procedure Laterality Date  . Tonsillectomy     Social History  Substance Use Topics  . Smoking status: Former Smoker    Quit date: 12/27/1990  . Smokeless tobacco: Never Used  . Alcohol Use: Yes     Comment: one drink per day   No Known Allergies Family History  Problem Relation Age of Onset  . Osteoporosis Mother   . ADD / ADHD Daughter   . Osteoporosis Maternal Grandmother   . Breast cancer Maternal Grandmother   . Coronary artery disease Other   . Hypertension Other   . Prostate cancer Other   . Colon cancer Neg Hx      Past medical history, social, surgical and family history all reviewed in electronic medical record.   Review of Systems: No headache, visual changes, nausea, vomiting, diarrhea, constipation, dizziness, abdominal pain, skin rash, fevers, chills, night sweats, weight loss, swollen lymph nodes, body aches, joint swelling, muscle aches, chest pain, shortness of breath, mood changes.   Objective Blood pressure 132/84, pulse 54, height 5\' 3"  (1.6 m),  weight 162 lb (73.483 kg).  General: No apparent distress alert and oriented x3 mood and affect normal, dressed appropriately.  HEENT: Pupils equal, extraocular movements intact  Respiratory: Patient's speak in full sentences and does not appear short of breath  Cardiovascular: No lower extremity edema, non tender, no erythema  Skin: Warm dry intact with no signs of  infection or rash on extremities or on axial skeleton.  Abdomen: Soft nontender  Neuro: Cranial nerves II through XII are intact, neurovascularly intact in all extremities with 2+ DTRs and 2+ pulses.  Lymph: No lymphadenopathy of posterior or anterior cervical chain or axillae bilaterally.  Gait normal with good balance and coordination.  MSK:  Non tender with full range of motion and good stability and symmetric strength and tone of shoulders, elbows, wrist, hip, and ankles bilaterally.  Knee: Left Normal to inspection with no erythema or effusion or obvious bony abnormalities. Palpation normal with no warmth, joint line tenderness, patellar tenderness, or condyle TTP on medial aspect of the hamstring. Tightness on hamstrings.  ROM full in flexion and extension and lower leg rotation. Ligaments with solid consistent endpoints including ACL, PCL, LCL, MCL. Negative Mcmurray's, Apley's, and Thessalonian tests. Non painful patellar compression. Patellar glide with mild crepitus. Patellar and quadriceps tendons unremarkable. Hamstring and quadriceps strength is normal.  Contralateral knee unremarkable  Back Exam:  Inspection: Unremarkable  Motion: Flexion 45 deg, Extension 45 deg, Side Bending to 45 deg bilaterally,  Rotation to 45 deg bilaterally  SLR laying: Negative  XSLR laying: Negative  Palpable tenderness: Tender to palpation over the sacroiliac joints FABER: Bilaterally positive Sensory change: Gross sensation intact to all lumbar and sacral dermatomes.  Reflexes: 2+ at both patellar tendons, 2+ at achilles tendons, Babinski's downgoing.  Strength at foot  Plantar-flexion: 5/5 Dorsi-flexion: 5/5 Eversion: 5/5 Inversion: 5/5  Leg strength  Quad: 5/5 Hamstring: 5/5 Hip flexor: 5/5 Hip abductors: 4/5  Gait unremarkable.  Osteopathic findings C2 flexed rotated and side bent left  T3 extended rotated and side bent right  L2 flexed rotated and side bent right  Sacrum left on  left   Impression and Recommendations:     This case required medical decision making of moderate complexity.

## 2016-01-13 NOTE — Assessment & Plan Note (Signed)
Decision today to treat with OMT was based on Physical Exam  After verbal consent patient was treated with HVLA, ME, FPR techniques in cervical, thoracic, lumbar and sacral areas  Patient tolerated the procedure well with improvement in symptoms  Patient given exercises, stretches and lifestyle modifications  See medications in patient instructions if given  Patient will follow up in 4-6 weeks 

## 2016-01-13 NOTE — Assessment & Plan Note (Signed)
[place] for statin holiday, f/u lab in 1 mo, consider change to crestor 10

## 2016-01-13 NOTE — Patient Instructions (Addendum)
Good to see you We are making some progress Ice is your friend The hamstring will take another 3 weeks or so.  We attempted manipulation again to see if we can make some progress  Consider vitamin D 2000 IU daily Turmeric 500mg  twice daily See me again in 4 weeks We will try to get that desk as well.

## 2016-01-13 NOTE — Progress Notes (Signed)
Subjective:    Patient ID: Annette [name], female    DOB: [birth year], 58 y.o.   MRN: [id number]  HPI  Here for wellness and f/u;  Overall doing ok;  Pt denies Chest pain, worsening SOB, DOE, wheezing, orthopnea, PND, worsening LE edema, palpitations, dizziness or syncope.  Pt denies neurological change such as new headache, facial or extremity weakness.  Pt denies polydipsia, polyuria, or low sugar symptoms. Pt states overall good compliance with treatment and medications, good tolerability, and has been trying to follow appropriate diet.  Pt denies worsening depressive symptoms, suicidal ideation or panic. No fever, night sweats, wt loss, loss of appetite, or other constitutional symptoms.  Pt states good ability with ADL's, has low fall risk, home safety reviewed and adequate, no other significant changes in hearing or vision, somewhat less active with exercise recently due MSK pain, seeing Dr Smith/sport med, and also asks for lipitor holiday to see if affects the pain. Would like crestor instead if LDL increased on diet only in the next month,  Wt Readings from Last 3 Encounters:  01/13/16 162 lb 4 oz (73.596 kg)  12/19/15 160 lb (72.576 kg)  06/24/15 155 lb (70.308 kg)   Past Medical History  Diagnosis Date  . HYPERLIPIDEMIA 08/20/2007  . OBESITY, MILD 08/20/2007  . ANXIETY 11/10/2010  . DEPRESSION 08/20/2007  . ATTENTION DEFICIT DISORDER 11/10/2010  . Carpal tunnel syndrome 08/20/2007  . HYPERTENSION 08/20/2007  . Raynaud's syndrome 08/20/2007  . ALLERGIC RHINITIS 10/01/2007  . EMPHYSEMA 06/16/2010  . BARTHOLIN'S CYST 08/20/2007  . HOT FLASHES 09/26/2009  . MENOPAUSAL DISORDER 11/07/2009  . POLYARTHRALGIA 11/06/2008  . OSTEOPENIA 06/16/2010  . Insomnia, unspecified 10/01/2007  . COLONIC POLYPS, HX OF 08/20/2007  . ISCHEMIC COLITIS, HX OF 10/01/2007   Past Surgical History  Procedure Laterality Date  . Tonsillectomy      reports that she quit smoking about 25 years ago. She has never  used smokeless tobacco. She reports that she drinks alcohol. She reports that she does not use illicit drugs. family history includes ADD / ADHD in her daughter; Breast cancer in her maternal grandmother; Coronary artery disease in her other; Hypertension in her other; Osteoporosis in her maternal grandmother and mother; Prostate cancer in her other. There is no history of Colon cancer. No Known Allergies Current Outpatient Prescriptions on File Prior to Visit  Medication Sig Dispense Refill  . aspirin EC 81 MG tablet Take 1 tablet (81 mg total) by mouth daily. 90 tablet 11  . atorvastatin (LIPITOR) 10 MG tablet Take 1 tablet (10 mg total) by mouth daily. 90 tablet 3  . ibuprofen (ADVIL,MOTRIN) 200 MG tablet Take 400 mg by mouth every 6 (six) hours as needed for moderate pain.    . meloxicam (MOBIC) 15 MG tablet Take 1 tablet (15 mg total) by mouth daily. 30 tablet 0  . traMADol (ULTRAM) 50 MG tablet Take 1 tablet (50 mg total) by mouth every 6 (six) hours as needed for moderate pain. 30 tablet 2  . zolpidem (AMBIEN) 10 MG tablet Take 1 tablet (10 mg total) by mouth at bedtime as needed for sleep. 90 tablet 1   No current facility-administered medications on file prior to visit.   Review of Systems  Constitutional: Negative for increased diaphoresis, other activity, appetite or siginficant weight change other than noted HENT: Negative for worsening hearing loss, ear pain, facial swelling, mouth sores and neck stiffness.   Eyes: Negative for other worsening pain, redness or visual  disturbance.  Respiratory: Negative for shortness of breath and wheezing  Cardiovascular: Negative for chest pain and palpitations.  Gastrointestinal: Negative for diarrhea, blood in stool, abdominal distention or other pain Genitourinary: Negative for hematuria, flank pain or change in urine volume.  Musculoskeletal: Negative for myalgias or other joint complaints.  Skin: Negative for color change and wound or  drainage.  Neurological: Negative for syncope and numbness. other than noted Hematological: Negative for adenopathy. or other swelling Psychiatric/Behavioral: Negative for hallucinations, SI, self-injury, decreased concentration or other worsening agitation.       Objective:   Physical Exam BP 132/84 mmHg  Pulse 54  Temp(Src) 98.6 F (37 C) (Oral)  Resp 20  Ht 5\' 3"  (1.6 m)  Wt 162 lb 4 oz (73.596 kg)  BMI 28.75 kg/m2  SpO2 87% VS noted,  Constitutional: Pt is oriented to person, place, and time. Appears well-developed and well-nourished, in no significant distress Head: Normocephalic and atraumatic.  Right Ear: External ear normal.  Left Ear: External ear normal.  Nose: Nose normal.  Mouth/Throat: Oropharynx is clear and moist.  Eyes: Conjunctivae and EOM are normal. Pupils are equal, round, and reactive to light.  Neck: Normal range of motion. Neck supple. No JVD present. No tracheal deviation present or significant neck LA or mass Cardiovascular: Normal rate, regular rhythm, normal heart sounds and intact distal pulses.   Pulmonary/Chest: Effort normal and breath sounds without rales or wheezing  Abdominal: Soft. Bowel sounds are normal. NT. No HSM  Musculoskeletal: Normal range of motion. Exhibits no edema.  Lymphadenopathy:  Has no cervical adenopathy.  Neurological: Pt is alert and oriented to person, place, and time. Pt has normal reflexes. No cranial nerve deficit. Motor grossly intact Skin: Skin is warm and dry. No rash noted.  Psychiatric:  Has normal mood and affect. Behavior is normal.     Assessment & Plan:

## 2016-02-16 ENCOUNTER — Ambulatory Visit: Payer: Commercial Managed Care - PPO | Admitting: Family Medicine

## 2016-02-24 ENCOUNTER — Ambulatory Visit: Payer: Commercial Managed Care - PPO | Admitting: Family Medicine

## 2016-02-25 ENCOUNTER — Other Ambulatory Visit (INDEPENDENT_AMBULATORY_CARE_PROVIDER_SITE_OTHER): Payer: Commercial Managed Care - PPO

## 2016-02-25 ENCOUNTER — Ambulatory Visit: Payer: Commercial Managed Care - PPO | Admitting: Family

## 2016-02-25 DIAGNOSIS — Z0000 Encounter for general adult medical examination without abnormal findings: Secondary | ICD-10-CM

## 2016-02-25 LAB — HEPATIC FUNCTION PANEL
ALBUMIN: 4.2 g/dL (ref 3.5–5.2)
ALK PHOS: 61 U/L (ref 39–117)
ALT: 16 U/L (ref 0–35)
AST: 23 U/L (ref 0–37)
Bilirubin, Direct: 0 mg/dL (ref 0.0–0.3)
TOTAL PROTEIN: 7 g/dL (ref 6.0–8.3)
Total Bilirubin: 0.3 mg/dL (ref 0.2–1.2)

## 2016-02-25 LAB — CBC WITH DIFFERENTIAL/PLATELET
BASOS ABS: 0 10*3/uL (ref 0.0–0.1)
Basophils Relative: 0.8 % (ref 0.0–3.0)
EOS ABS: 0.1 10*3/uL (ref 0.0–0.7)
EOS PCT: 2 % (ref 0.0–5.0)
HCT: 39.8 % (ref 36.0–46.0)
HEMOGLOBIN: 13.2 g/dL (ref 12.0–15.0)
LYMPHS ABS: 2.9 10*3/uL (ref 0.7–4.0)
Lymphocytes Relative: 46.3 % — ABNORMAL HIGH (ref 12.0–46.0)
MCHC: 33.2 g/dL (ref 30.0–36.0)
MCV: 85.3 fl (ref 78.0–100.0)
MONOS PCT: 6.4 % (ref 3.0–12.0)
Monocytes Absolute: 0.4 10*3/uL (ref 0.1–1.0)
NEUTROS PCT: 44.5 % (ref 43.0–77.0)
Neutro Abs: 2.8 10*3/uL (ref 1.4–7.7)
Platelets: 214 10*3/uL (ref 150.0–400.0)
RBC: 4.67 Mil/uL (ref 3.87–5.11)
RDW: 14.3 % (ref 11.5–15.5)
WBC: 6.2 10*3/uL (ref 4.0–10.5)

## 2016-02-25 LAB — BASIC METABOLIC PANEL
BUN: 21 mg/dL (ref 6–23)
CALCIUM: 9.5 mg/dL (ref 8.4–10.5)
CO2: 28 meq/L (ref 19–32)
CREATININE: 1.1 mg/dL (ref 0.40–1.20)
Chloride: 105 mEq/L (ref 96–112)
GFR: 65.73 mL/min (ref >60.00)
GLUCOSE: 92 mg/dL (ref 70–99)
Potassium: 4.5 mEq/L (ref 3.5–5.1)
SODIUM: 137 meq/L (ref 135–145)

## 2016-02-25 LAB — LIPID PANEL
CHOL/HDL RATIO: 4
CHOLESTEROL: 200 mg/dL (ref 0–200)
HDL: 54.8 mg/dL (ref >39.00)
LDL CALC: 120 mg/dL — AB (ref 0–99)
NonHDL: 145.06
TRIGLYCERIDES: 126 mg/dL (ref 0.0–149.0)
VLDL: 25.2 mg/dL (ref 0.0–40.0)

## 2016-02-25 LAB — URINALYSIS, ROUTINE W REFLEX MICROSCOPIC
BILIRUBIN URINE: NEGATIVE
KETONES UR: NEGATIVE
NITRITE: NEGATIVE
PH: 6 (ref 5.0–8.0)
SPECIFIC GRAVITY, URINE: 1.015 (ref 1.000–1.030)
TOTAL PROTEIN, URINE-UPE24: NEGATIVE
Urine Glucose: NEGATIVE
Urobilinogen, UA: 0.2 (ref 0.0–1.0)

## 2016-02-25 LAB — HEPATITIS C ANTIBODY: HCV Ab: NEGATIVE

## 2016-02-25 LAB — TSH: TSH: 1.74 u[IU]/mL (ref 0.35–4.50)

## 2016-02-26 ENCOUNTER — Encounter: Payer: Commercial Managed Care - PPO | Attending: Internal Medicine | Admitting: Dietician

## 2016-02-26 ENCOUNTER — Encounter: Payer: Self-pay | Admitting: Dietician

## 2016-02-26 VITALS — Ht 63.0 in | Wt 162.0 lb

## 2016-02-26 DIAGNOSIS — E785 Hyperlipidemia, unspecified: Secondary | ICD-10-CM | POA: Diagnosis present

## 2016-02-26 DIAGNOSIS — Z6828 Body mass index (BMI) 28.0-28.9, adult: Secondary | ICD-10-CM

## 2016-02-26 NOTE — Progress Notes (Signed)
Medical Nutrition Therapy:  Appt start time: [id number] end time:  [id number].   Assessment:  Primary concerns today: Patient is here today due to hyperlipidemia and difficulty controlling her weight.  02/25/16:  Cholesterol 200, triglycerides 126, HDL 55, LDL 120.  She would like to stay off of the Statin drugs due to the way they make her feel and would like to lose weight.  She has been trying to lose but has not been successful.  She has made diet changes and increased her exercise for the past month.  Per her diet hx she has been eating out twice per week as well as increased amounts of healthy fats which may be effecting ability to lose.  She also has increased exercise so body composition is changing.  Clothes are fitting differently. She reports sleeping well- >7 hours.  Patient lives with her husband.  She shops and husband often cooks.  She works for a [employer].  Weight Hx: Low:  120 in 20's High: 165 lbs Current 162 lbs.  Lost 1-2 lbs in the past month.  Preferred Learning Style:   No preference indicated   Learning Readiness:   Ready  MEDICATIONS: see list   DIETARY INTAKE:  Usual eating pattern includes 3 meals and 4 snacks per day.  24-hr recall:  B ( AM): soy milk with protein powder, flax seeds, 2 boiled eggs, banana Snk ( AM): yogurt (1/2-3/4 cup greek yogurt with small amount of granola)  L ( PM): salad with sardines OR soup with Baby Bell and Wheat Thins,  Snk ( PM): 1/4 cup mixed nuts and/or fruit D ( PM): spaghetti, or rice, chicken or ribs, or steak with steamed vegetables, avocado's with tomatoes and chopped onions, black beans or other legume Snk ( PM): skinny pop popcorn, occasional dessert Beverages: coffee with soy milk and 1 pack sugar (2 cups per day), 1 glass wine (10 ounces) most nights, water (4 cups), unsweetened hot tea.  Usual physical activity: works out 5 days per week, runs intervals or spinning or power class, yoga for 1 hour or more  "Boot camp challenge  with [employer]"  Recently increased the amount of time and frequency she works out.  Estimated energy needs: 1500 calories 170 g carbohydrates 94 g protein 50 g fat  Progress Towards Goal(s):  In progress.   Nutritional Diagnosis:  NB-1.1 Food and nutrition-related knowledge deficit As related to balance of energy expenditure.  As evidenced by diet hx.    Intervention:  Nutrition counseling/education regarding weight loss and healthy eating for lipid improvement.  Discussed eating out.  Discussed reducing snacks.  Smaller portion of avocado.  Choose 2-3 carbohydrate portions per meal 0-1 carbohydrate choice for a snack. Protein in small amounts with each meal and snack. Consider meatless meals at times.  Choose lean protein, chicken and fish most often.  Beans are a good protein source. Eat when hungry.  Eat slowly.  Eat away from your desk, TV, and electronics. Avoid being ravenous.  Try to eat within 1 hour of waking.  Set a timer for lunch if needed. Be mindful when eating out.  Split portions or get a to go box at the beginning of the meal. Bake rather than fry. Reduce portion of butter.  Olive oil is a better choice but the portion is still small. Great job on the changes you have made with your diet and the continued exercise!  Teaching Method Utilized:  Visual Auditory Hands on  Handouts given  during visit include:  Meal plan card  Snack list  Barriers to learning/adherence to lifestyle change: none  Demonstrated degree of understanding via:  Teach Back   Monitoring/Evaluation:  Dietary intake, exercise, and body weight in 8 week(s).

## 2016-02-26 NOTE — Patient Instructions (Signed)
Smaller portion of avocado.  Choose 2-3 carbohydrate portions per meal 0-1 carbohydrate choice for a snack. Protein in small amounts with each meal and snack. Consider meatless meals at times.  Choose lean protein, chicken and fish most often.  Beans are a good protein source. Eat when hungry.  Eat slowly.  Eat away from your desk, TV, and electronics. Avoid being ravenous.  Try to eat within 1 hour of waking.  Set a timer for lunch if needed. Be mindful when eating out.  Split portions or get a to go box at the beginning of the meal. Bake rather than fry. Reduce portion of butter.  Olive oil is a better choice but the portion is still small. Great job on the changes you have made with your diet and the continued exercise!

## 2016-02-27 ENCOUNTER — Other Ambulatory Visit: Payer: Self-pay | Admitting: Internal Medicine

## 2016-02-27 MED ORDER — ROSUVASTATIN CALCIUM 10 MG PO TABS: 10.0000 mg | ORAL_TABLET | Freq: Every day | ORAL | Status: DC

## 2016-03-15 ENCOUNTER — Encounter: Payer: Self-pay | Admitting: Family Medicine

## 2016-03-15 ENCOUNTER — Ambulatory Visit (INDEPENDENT_AMBULATORY_CARE_PROVIDER_SITE_OTHER): Payer: Commercial Managed Care - PPO | Admitting: Family Medicine

## 2016-03-15 VITALS — BP 108/70 | HR 58 | Ht 63.0 in | Wt 160.0 lb

## 2016-03-15 DIAGNOSIS — M533 Sacrococcygeal disorders, not elsewhere classified: Secondary | ICD-10-CM | POA: Diagnosis not present

## 2016-03-15 DIAGNOSIS — M9903 Segmental and somatic dysfunction of lumbar region: Secondary | ICD-10-CM

## 2016-03-15 DIAGNOSIS — M9901 Segmental and somatic dysfunction of cervical region: Secondary | ICD-10-CM

## 2016-03-15 DIAGNOSIS — M999 Biomechanical lesion, unspecified: Secondary | ICD-10-CM

## 2016-03-15 DIAGNOSIS — M9904 Segmental and somatic dysfunction of sacral region: Secondary | ICD-10-CM | POA: Diagnosis not present

## 2016-03-15 MED ORDER — MELOXICAM 15 MG PO TABS: 15.0000 mg | ORAL_TABLET | Freq: Every day | ORAL | Status: DC

## 2016-03-15 NOTE — Assessment & Plan Note (Signed)
Patient is doing better overall. Encourage her to continue the different supplementations that she's been doing regularly. Patient was given a oral anti-inflammatory to have on hand. We discussed using an more of a burst treatment. Patient is going to continue to be active. Continuing range of motion exercises and hip abductor strengthening. Patient does respond well to osteopathic manipulation and will see me again in 6-8 weeks.

## 2016-03-15 NOTE — Patient Instructions (Addendum)
Good to see you  Ice is your friend Stay active and continue to work on the core strength  Work on the stretching after running Break 30 minutes!  I know you can do it.  Continue everything else you are doing See me again in 6-8 weeks.

## 2016-03-15 NOTE — Progress Notes (Signed)
[name] [place], [place] [address] Phone: [phone] Subjective:    I'm seeing this patient by the request  of:  Cathlean Cower, MD   CC: Left knee pain and moderate back pain f/u   [id number] Annette [name] is a 59 y.o. female coming in with complaint of left knee pain. Patient was found to have more of a hamstring tendinitis. Patient has been given exercises and had been making some significant improvement. Patient is been able to run for greater than 30 minutes without any significant pain. Not noticing the tightness as much. No new symptoms.  Patient is also complaining of low back pain. Seen by me and there was a concern for more of a sacroiliac joint dysfunction. Patient was given home exercises, icing protocol, we discussed which activities to do in which ones to avoid. Patient did have osteopathic manipulation at last follow-up. Patient statessome mild low back pain but nothing that seems to be severe. Patient states that overall she seems to be doing relatively well. Still has some discomfort on the left side. Not stopping her from activities but does seem to be worse after running.  Past mental history patient back includes having MRIs over independently visualized by me. These MRIs her back in 2010. Showed very minimal disc protrusions T6-T9 and fairly unremarkable lumbar MRI.    Past Medical History  Diagnosis Date  . HYPERLIPIDEMIA 08/20/2007  . OBESITY, MILD 08/20/2007  . ANXIETY 11/10/2010  . DEPRESSION 08/20/2007  . ATTENTION DEFICIT DISORDER 11/10/2010  . Carpal tunnel syndrome 08/20/2007  . HYPERTENSION 08/20/2007  . Raynaud's syndrome 08/20/2007  . ALLERGIC RHINITIS 10/01/2007  . EMPHYSEMA 06/16/2010  . BARTHOLIN'S CYST 08/20/2007  . HOT FLASHES 09/26/2009  . MENOPAUSAL DISORDER 11/07/2009  . POLYARTHRALGIA 11/06/2008  . OSTEOPENIA 06/16/2010  . Insomnia, unspecified 10/01/2007  . COLONIC POLYPS, HX OF 08/20/2007  . ISCHEMIC  COLITIS, HX OF 10/01/2007   Past Surgical History  Procedure Laterality Date  . Tonsillectomy     Social History  Substance Use Topics  . Smoking status: Former Smoker    Quit date: 12/27/1990  . Smokeless tobacco: Never Used  . Alcohol Use: Yes     Comment: one drink per day   No Known Allergies Family History  Problem Relation Age of Onset  . Osteoporosis Mother   . ADD / ADHD Daughter   . Osteoporosis Maternal Grandmother   . Breast cancer Maternal Grandmother   . Coronary artery disease Other   . Hypertension Other   . Prostate cancer Other   . Colon cancer Neg Hx      Past medical history, social, surgical and family history all reviewed in electronic medical record.   Review of Systems: No headache, visual changes, nausea, vomiting, diarrhea, constipation, dizziness, abdominal pain, skin rash, fevers, chills, night sweats, weight loss, swollen lymph nodes, body aches, joint swelling, muscle aches, chest pain, shortness of breath, mood changes.   Objective Blood pressure 108/70, pulse 58, height 5\' 3"  (1.6 m), weight 160 lb (72.576 kg), SpO2 98 %.  General: No apparent distress alert and oriented x3 mood and affect normal, dressed appropriately.  HEENT: Pupils equal, extraocular movements intact  Respiratory: Patient's speak in full sentences and does not appear short of breath  Cardiovascular: No lower extremity edema, non tender, no erythema  Skin: Warm dry intact with no signs of infection or rash on extremities or on axial skeleton.  Abdomen: Soft nontender  Neuro:  Cranial nerves II through XII are intact, neurovascularly intact in all extremities with 2+ DTRs and 2+ pulses.  Lymph: No lymphadenopathy of posterior or anterior cervical chain or axillae bilaterally.  Gait normal with good balance and coordination.  MSK:  Non tender with full range of motion and good stability and symmetric strength and tone of shoulders, elbows, wrist, hip, and ankles bilaterally.    Knee: Left Normal to inspection with no erythema or effusion or obvious bony abnormalities. Palpation normal with no warmth, joint line tenderness, patellar tenderness, or condyle nontender over the hamstring  ROM full in flexion and extension and lower leg rotation. Ligaments with solid consistent endpoints including ACL, PCL, LCL, MCL. Negative Mcmurray's, Apley's, and Thessalonian tests. Non painful patellar compression. Patellar glide with mild crepitus. Patellar and quadriceps tendons unremarkable. Hamstring and quadriceps strength is normal.  Contralateral knee unremarkable Improved from previous exam  Back Exam:  Inspection: Unremarkable  Motion: Flexion 45 deg, Extension 45 deg, Side Bending to 45 deg bilaterally,  Rotation to 45 deg bilaterally  SLR laying: Negative  XSLR laying: Negative  Palpable tenderness: continue mild tenderness over the left sacroiliac joint much better over the right. FABER:positive on the left Sensory change: Gross sensation intact to all lumbar and sacral dermatomes.  Reflexes: 2+ at both patellar tendons, 2+ at achilles tendons, Babinski's downgoing.  Strength at foot  Plantar-flexion: 5/5 Dorsi-flexion: 5/5 Eversion: 5/5 Inversion: 5/5  Leg strength  Quad: 5/5 Hamstring: 5/5 Hip flexor: 5/5 Hip abductors: 4/5  Gait unremarkable.  Osteopathic findings C2 flexed rotated and side bent left  T3 extended rotated and side bent right  L2 flexed rotated and side bent right L4 flexed rotated and side bent left  Sacrum left on left   Impression and Recommendations:     This case required medical decision making of moderate complexity.

## 2016-03-15 NOTE — Progress Notes (Signed)
Pre visit review using our clinic review tool, if applicable. No additional management support is needed unless otherwise documented below in the visit note. 

## 2016-03-15 NOTE — Assessment & Plan Note (Signed)
Decision today to treat with OMT was based on Physical Exam  After verbal consent patient was treated with HVLA, ME, FPR techniques in cervical, thoracic, lumbar and sacral areas  Patient tolerated the procedure well with improvement in symptoms  Patient given exercises, stretches and lifestyle modifications  See medications in patient instructions if given  Patient will follow up in 6-8 weeks 

## 2016-04-20 ENCOUNTER — Ambulatory Visit: Payer: Commercial Managed Care - PPO | Admitting: Dietician

## 2016-04-26 ENCOUNTER — Encounter: Payer: Self-pay | Admitting: Family Medicine

## 2016-04-26 ENCOUNTER — Ambulatory Visit (INDEPENDENT_AMBULATORY_CARE_PROVIDER_SITE_OTHER): Payer: Commercial Managed Care - PPO | Admitting: Family Medicine

## 2016-04-26 ENCOUNTER — Telehealth: Payer: Self-pay | Admitting: Internal Medicine

## 2016-04-26 VITALS — BP 112/72 | HR 56 | Wt 158.0 lb

## 2016-04-26 DIAGNOSIS — M533 Sacrococcygeal disorders, not elsewhere classified: Secondary | ICD-10-CM

## 2016-04-26 DIAGNOSIS — M9903 Segmental and somatic dysfunction of lumbar region: Secondary | ICD-10-CM | POA: Diagnosis not present

## 2016-04-26 DIAGNOSIS — M9901 Segmental and somatic dysfunction of cervical region: Secondary | ICD-10-CM

## 2016-04-26 DIAGNOSIS — M9904 Segmental and somatic dysfunction of sacral region: Secondary | ICD-10-CM | POA: Diagnosis not present

## 2016-04-26 DIAGNOSIS — M999 Biomechanical lesion, unspecified: Secondary | ICD-10-CM

## 2016-04-26 NOTE — Patient Instructions (Signed)
Good to see you  Annette [name] is your friend Stay active and the core is doing great ! On wall with heels, butt shoulder and head touching for a goal of 5 minutes daily  Tennis ball between shoulder blades with sitting to help with posture.  Congrats on the grand baby!   See me again in 6ish weeks.  Look up apps for a 10K training. OK to do this as you get ready for another 5K

## 2016-04-26 NOTE — Assessment & Plan Note (Signed)
Decision today to treat with OMT was based on Physical Exam  After verbal consent patient was treated with HVLA, ME, FPR techniques in cervical, thoracic, lumbar and sacral areas  Patient tolerated the procedure well with improvement in symptoms  Patient given exercises, stretches and lifestyle modifications  See medications in patient instructions if given  Patient will follow up in 6-8 weeks 

## 2016-04-26 NOTE — Assessment & Plan Note (Signed)
Overall doing relatively well. Does have some poor posture. We discussed ergonomics at work think be beneficial. Patient will continue to transition from a seated to standing desk occasionally throughout. Patient and will come back and see me again in 6 weeks. Discussed continuing the course strengthening exercises.

## 2016-04-26 NOTE — Progress Notes (Signed)
[name] [place], [place] [address] Phone: [phone] Subjective:    I'm seeing this patient by the request  of:  Cathlean Cower, MD   CC: moderate back pain f/u   [id number] Annette [name] is a 60 y.o. female coming in with complaint of low back pain. Patient has been seen previously for osteopathic manipulation. Patient has been doing very well with conservative therapy. Patient states that this only she continues to work out and do the exercises regularly she seems to do well. Having mild increase in upper back pain. States that it seems to be worse when she is at a computer for long amount of time. Denies any radiation down the arm and denies any neck pain associated with it. Patient continues to try to work out regularly. Did run a 5K recently and under 34 minutes. This was first in her age group. Overall feels she is making strides and becoming healthier overall.  Past mental history patient back includes having MRIs over independently visualized by me. These MRIs her back in 2010. Showed very minimal disc protrusions T6-T9 and fairly unremarkable lumbar MRI.    Past Medical History  Diagnosis Date  . HYPERLIPIDEMIA 08/20/2007  . OBESITY, MILD 08/20/2007  . ANXIETY 11/10/2010  . DEPRESSION 08/20/2007  . ATTENTION DEFICIT DISORDER 11/10/2010  . Carpal tunnel syndrome 08/20/2007  . HYPERTENSION 08/20/2007  . Raynaud's syndrome 08/20/2007  . ALLERGIC RHINITIS 10/01/2007  . EMPHYSEMA 06/16/2010  . BARTHOLIN'S CYST 08/20/2007  . HOT FLASHES 09/26/2009  . MENOPAUSAL DISORDER 11/07/2009  . POLYARTHRALGIA 11/06/2008  . OSTEOPENIA 06/16/2010  . Insomnia, unspecified 10/01/2007  . COLONIC POLYPS, HX OF 08/20/2007  . ISCHEMIC COLITIS, HX OF 10/01/2007   Past Surgical History  Procedure Laterality Date  . Tonsillectomy     Social History  Substance Use Topics  . Smoking status: Former Smoker    Quit date: 12/27/1990  . Smokeless tobacco: Never  Used  . Alcohol Use: Yes     Comment: one drink per day   No Known Allergies Family History  Problem Relation Age of Onset  . Osteoporosis Mother   . ADD / ADHD Daughter   . Osteoporosis Maternal Grandmother   . Breast cancer Maternal Grandmother   . Coronary artery disease Other   . Hypertension Other   . Prostate cancer Other   . Colon cancer Neg Hx      Past medical history, social, surgical and family history all reviewed in electronic medical record.   Review of Systems: No headache, visual changes, nausea, vomiting, diarrhea, constipation, dizziness, abdominal pain, skin rash, fevers, chills, night sweats, weight loss, swollen lymph nodes, body aches, joint swelling, muscle aches, chest pain, shortness of breath, mood changes.   Objective Blood pressure 112/72, pulse 56, weight 158 lb (71.668 kg).  General: No apparent distress alert and oriented x3 mood and affect normal, dressed appropriately.  HEENT: Pupils equal, extraocular movements intact  Respiratory: Patient's speak in full sentences and does not appear short of breath  Cardiovascular: No lower extremity edema, non tender, no erythema  Skin: Warm dry intact with no signs of infection or rash on extremities or on axial skeleton.  Abdomen: Soft nontender  Neuro: Cranial nerves II through XII are intact, neurovascularly intact in all extremities with 2+ DTRs and 2+ pulses.  Lymph: No lymphadenopathy of posterior or anterior cervical chain or axillae bilaterally.  Gait normal with good balance and coordination.  MSK:  Non  tender with full range of motion and good stability and symmetric strength and tone of shoulders, elbows, wrist, hip, knees and ankles bilaterally.    Back Exam:  Inspection: Unremarkable  Motion: Flexion 45 deg, Extension 45 deg, Side Bending to 45 deg bilaterally,  Rotation to 45 deg bilaterally  SLR laying: Negative  XSLR laying: Negative  Palpable tenderness: mild discomfort over the right  sacroiliac joint as well as their spent a musculature of the thoracic spine FABER:positive on the left Sensory change: Gross sensation intact to all lumbar and sacral dermatomes.  Reflexes: 2+ at both patellar tendons, 2+ at achilles tendons, Babinski's downgoing.  Strength at foot  Plantar-flexion: 5/5 Dorsi-flexion: 5/5 Eversion: 5/5 Inversion: 5/5  Leg strength  Quad: 5/5 Hamstring: 5/5 Hip flexor: 5/5 Hip abductors: 4/5  Gait unremarkable.  Osteopathic findings C2 flexed rotated and side bent left  T3 extended rotated and side bent righti nhaled third rib on the right T7 extended rotated and side bent left L2 flexed rotated and side bent right   Sacrum left on left   Impression and Recommendations:     This case required medical decision making of moderate complexity.

## 2016-04-26 NOTE — Telephone Encounter (Signed)
Pt come by and would like a 90 day supply of the rosuvastatin (CRESTOR) 10 MG tablet [id number]   [facility]

## 2016-04-27 MED ORDER — ROSUVASTATIN CALCIUM 10 MG PO TABS: 10.0000 mg | ORAL_TABLET | Freq: Every day | ORAL | Status: DC

## 2016-04-27 NOTE — Telephone Encounter (Signed)
Medication sent to pharmacy  

## 2016-06-04 ENCOUNTER — Encounter: Payer: Self-pay | Admitting: Internal Medicine

## 2016-06-07 ENCOUNTER — Ambulatory Visit: Payer: Commercial Managed Care - PPO | Admitting: Family Medicine

## 2016-06-08 ENCOUNTER — Ambulatory Visit: Payer: Commercial Managed Care - PPO | Admitting: Family Medicine

## 2016-06-21 ENCOUNTER — Encounter: Payer: Self-pay | Admitting: Family Medicine

## 2016-06-21 ENCOUNTER — Ambulatory Visit (INDEPENDENT_AMBULATORY_CARE_PROVIDER_SITE_OTHER): Payer: Commercial Managed Care - PPO | Admitting: Family Medicine

## 2016-06-21 VITALS — BP 114/80 | HR 55 | Ht 63.0 in | Wt 161.0 lb

## 2016-06-21 DIAGNOSIS — M9901 Segmental and somatic dysfunction of cervical region: Secondary | ICD-10-CM

## 2016-06-21 DIAGNOSIS — M999 Biomechanical lesion, unspecified: Secondary | ICD-10-CM

## 2016-06-21 DIAGNOSIS — M533 Sacrococcygeal disorders, not elsewhere classified: Secondary | ICD-10-CM | POA: Diagnosis not present

## 2016-06-21 DIAGNOSIS — M9904 Segmental and somatic dysfunction of sacral region: Secondary | ICD-10-CM

## 2016-06-21 DIAGNOSIS — M9903 Segmental and somatic dysfunction of lumbar region: Secondary | ICD-10-CM

## 2016-06-21 MED ORDER — DICLOFENAC SODIUM 2 % TD SOLN: 2.0000 "application " | Freq: Two times a day (BID) | TRANSDERMAL | Status: DC

## 2016-06-21 NOTE — Progress Notes (Signed)
Pre visit review using our clinic review tool, if applicable. No additional management support is needed unless otherwise documented below in the visit note. 

## 2016-06-21 NOTE — Assessment & Plan Note (Signed)
Patient overall seems to be doing relatively well. Is working on a more regular basis. Encourage her to continue to do so. We discussed the importance of the hip abductors and stretching the hip flexors. We discussed icing regimen on a regular basis as well. Discussed with any type of training patient 2 weeks of soreness. She will come back and see me again in 2 months otherwise.

## 2016-06-21 NOTE — Progress Notes (Signed)
[name] [place], [place] [address] Phone: [phone] Subjective:     CC: back pain f/u   [id number] Annette [name] is a 60 y.o. female coming in with complaint of low back pain. Patient has been seen previously for osteopathic manipulation. Has been 8 weeks since we seen her. She has started increasing her activity. Running on a more regular basis but does have some discomfort. Does he exercises occasionally but does forget to do them on a regular basis. Patient states that when she does do a regular basis she feels better. His any new symptoms such as radiation down the legs or any numbness or weakness. Overall does think she is making some mild improvement.      Past Medical History  Diagnosis Date  . HYPERLIPIDEMIA 08/20/2007  . OBESITY, MILD 08/20/2007  . ANXIETY 11/10/2010  . DEPRESSION 08/20/2007  . ATTENTION DEFICIT DISORDER 11/10/2010  . Carpal tunnel syndrome 08/20/2007  . HYPERTENSION 08/20/2007  . Raynaud's syndrome 08/20/2007  . ALLERGIC RHINITIS 10/01/2007  . EMPHYSEMA 06/16/2010  . BARTHOLIN'S CYST 08/20/2007  . HOT FLASHES 09/26/2009  . MENOPAUSAL DISORDER 11/07/2009  . POLYARTHRALGIA 11/06/2008  . OSTEOPENIA 06/16/2010  . Insomnia, unspecified 10/01/2007  . COLONIC POLYPS, HX OF 08/20/2007  . ISCHEMIC COLITIS, HX OF 10/01/2007   Past Surgical History  Procedure Laterality Date  . Tonsillectomy     Social History  Substance Use Topics  . Smoking status: Former Smoker    Quit date: 12/27/1990  . Smokeless tobacco: Never Used  . Alcohol Use: Yes     Comment: one drink per day   No Known Allergies Family History  Problem Relation Age of Onset  . Osteoporosis Mother   . ADD / ADHD Daughter   . Osteoporosis Maternal Grandmother   . Breast cancer Maternal Grandmother   . Coronary artery disease Other   . Hypertension Other   . Prostate cancer Other   . Colon cancer Neg Hx      Past medical history, social,  surgical and family history all reviewed in electronic medical record.   Review of Systems: No headache, visual changes, nausea, vomiting, diarrhea, constipation, dizziness, abdominal pain, skin rash, fevers, chills, night sweats, weight loss, swollen lymph nodes, body aches, joint swelling, muscle aches, chest pain, shortness of breath, mood changes.   Objective Blood pressure 114/80, pulse 55, height 5\' 3"  (1.6 m), weight 161 lb (73.029 kg), SpO2 99 %.  General: No apparent distress alert and oriented x3 mood and affect normal, dressed appropriately.  HEENT: Pupils equal, extraocular movements intact  Respiratory: Patient's speak in full sentences and does not appear short of breath  Cardiovascular: No lower extremity edema, non tender, no erythema  Skin: Warm dry intact with no signs of infection or rash on extremities or on axial skeleton.  Abdomen: Soft nontender  Neuro: Cranial nerves II through XII are intact, neurovascularly intact in all extremities with 2+ DTRs and 2+ pulses.  Lymph: No lymphadenopathy of posterior or anterior cervical chain or axillae bilaterally.  Gait normal with good balance and coordination.  MSK:  Non tender with full range of motion and good stability and symmetric strength and tone of shoulders, elbows, wrist, hip, knees and ankles bilaterally.    Back Exam:  Inspection: Unremarkable  Motion: Flexion 45 deg, Extension 45 deg, Side Bending to 45 deg bilaterally,  Rotation to 45 deg bilaterally  SLR laying: Negative  XSLR laying: Negative  Palpable tenderness:  Tightness of the hip flexors bilaterally but mild FABER: Negative today Sensory change: Gross sensation intact to all lumbar and sacral dermatomes.  Reflexes: 2+ at both patellar tendons, 2+ at achilles tendons, Babinski's downgoing.  Strength at foot  Plantar-flexion: 5/5 Dorsi-flexion: 5/5 Eversion: 5/5 Inversion: 5/5  Leg strength  Quad: 5/5 Hamstring: 5/5 Hip flexor: 5/5 Hip abductors: 4+/5    Gait unremarkable.  Osteopathic findings C4 flexed rotated and side bent left  T3 extended rotated and side bent righti nhaled third rib on the right T5 extended rotated and side bent left L2 flexed rotated and side bent right  Sacrum left on left   Impression and Recommendations:     This case required medical decision making of moderate complexity.

## 2016-06-21 NOTE — Patient Instructions (Signed)
Good to see you  Ice is your friend Stay active If you run know the first 2 weeks will stink some pennsaid pinkie amount topically 2 times daily as needed.  See me again in 8 weeks!

## 2016-06-21 NOTE — Assessment & Plan Note (Signed)
Decision today to treat with OMT was based on Physical Exam  After verbal consent patient was treated with HVLA, ME, FPR techniques in cervical, thoracic, lumbar and sacral areas  Patient tolerated the procedure well with improvement in symptoms  Patient given exercises, stretches and lifestyle modifications  See medications in patient instructions if given  Patient will follow up in 8 weeks 

## 2017-01-14 ENCOUNTER — Encounter: Payer: Commercial Managed Care - PPO | Admitting: Internal Medicine

## 2017-01-17 ENCOUNTER — Other Ambulatory Visit (INDEPENDENT_AMBULATORY_CARE_PROVIDER_SITE_OTHER): Payer: Commercial Managed Care - PPO

## 2017-01-17 DIAGNOSIS — Z0000 Encounter for general adult medical examination without abnormal findings: Secondary | ICD-10-CM

## 2017-01-17 LAB — URINALYSIS, ROUTINE W REFLEX MICROSCOPIC
BILIRUBIN URINE: NEGATIVE
Hgb urine dipstick: NEGATIVE
KETONES UR: NEGATIVE
NITRITE: NEGATIVE
PH: 5.5 (ref 5.0–8.0)
Specific Gravity, Urine: 1.025 (ref 1.000–1.030)
Total Protein, Urine: NEGATIVE
URINE GLUCOSE: NEGATIVE
Urobilinogen, UA: 0.2 (ref 0.0–1.0)

## 2017-01-17 LAB — CBC WITH DIFFERENTIAL/PLATELET
BASOS PCT: 0.4 % (ref 0.0–3.0)
Basophils Absolute: 0 10*3/uL (ref 0.0–0.1)
EOS PCT: 1.8 % (ref 0.0–5.0)
Eosinophils Absolute: 0.1 10*3/uL (ref 0.0–0.7)
HEMATOCRIT: 38.9 % (ref 36.0–46.0)
HEMOGLOBIN: 13 g/dL (ref 12.0–15.0)
LYMPHS PCT: 49.7 % — AB (ref 12.0–46.0)
Lymphs Abs: 3 10*3/uL (ref 0.7–4.0)
MCHC: 33.4 g/dL (ref 30.0–36.0)
MCV: 85.3 fl (ref 78.0–100.0)
MONOS PCT: 7.6 % (ref 3.0–12.0)
Monocytes Absolute: 0.5 10*3/uL (ref 0.1–1.0)
Neutro Abs: 2.4 10*3/uL (ref 1.4–7.7)
Neutrophils Relative %: 40.5 % — ABNORMAL LOW (ref 43.0–77.0)
Platelets: 194 10*3/uL (ref 150.0–400.0)
RBC: 4.57 Mil/uL (ref 3.87–5.11)
RDW: 14.1 % (ref 11.5–15.5)
WBC: 6 10*3/uL (ref 4.0–10.5)

## 2017-01-17 LAB — HEPATIC FUNCTION PANEL
ALT: 21 U/L (ref 0–35)
AST: 24 U/L (ref 0–37)
Albumin: 4 g/dL (ref 3.5–5.2)
Alkaline Phosphatase: 58 U/L (ref 39–117)
BILIRUBIN TOTAL: 0.4 mg/dL (ref 0.2–1.2)
Bilirubin, Direct: 0.1 mg/dL (ref 0.0–0.3)
Total Protein: 6.8 g/dL (ref 6.0–8.3)

## 2017-01-17 LAB — BASIC METABOLIC PANEL
BUN: 18 mg/dL (ref 6–23)
CO2: 26 mEq/L (ref 19–32)
CREATININE: 1.13 mg/dL (ref 0.40–1.20)
Calcium: 9.1 mg/dL (ref 8.4–10.5)
Chloride: 106 mEq/L (ref 96–112)
GFR: 63.53 mL/min (ref >60.00)
Glucose, Bld: 93 mg/dL (ref 70–99)
POTASSIUM: 4.1 meq/L (ref 3.5–5.1)
Sodium: 138 mEq/L (ref 135–145)

## 2017-01-17 LAB — LIPID PANEL
CHOL/HDL RATIO: 2
Cholesterol: 131 mg/dL (ref 0–200)
HDL: 57.9 mg/dL (ref >39.00)
LDL CALC: 59 mg/dL (ref 0–99)
NonHDL: 73.29
Triglycerides: 70 mg/dL (ref 0.0–149.0)
VLDL: 14 mg/dL (ref 0.0–40.0)

## 2017-01-17 LAB — TSH: TSH: 1.77 u[IU]/mL (ref 0.35–4.50)

## 2017-01-18 NOTE — Progress Notes (Signed)
[name] [place], [place] [address] Phone: [phone] Subjective:     CC: back pain f/u   [id number]  Annette [name] is a 61 y.o. female coming in with complaint of low back pain. Patient has been seen previously for osteopathic manipulation.See me again In 6 months ago. Patient states though she has started having increasing pain again. Seems to be low back. Mild radicular symptoms but very intermittent. Not stopping her from activities. Continues to do most of working out most days of the week. Denies any weakness at all of the lower extremity. Does have worsening.  Patient is also having worsening left leg pain. States that seems to be more of a tightness on the medial aspect of the knee. Seems worse after running. States that it hasn't aching pain from time to time. No locking or giving out on her. Does not remember nature injury. Rates the severity of pain a 4 out of 10. Concerned though can do seems to never be without some type of discomfort.      Past Medical History:  Diagnosis Date  . ALLERGIC RHINITIS 10/01/2007  . ANXIETY 11/10/2010  . ATTENTION DEFICIT DISORDER 11/10/2010  . BARTHOLIN'S CYST 08/20/2007  . Carpal tunnel syndrome 08/20/2007  . COLONIC POLYPS, HX OF 08/20/2007  . DEPRESSION 08/20/2007  . EMPHYSEMA 06/16/2010  . HOT FLASHES 09/26/2009  . HYPERLIPIDEMIA 08/20/2007  . HYPERTENSION 08/20/2007  . Insomnia, unspecified 10/01/2007  . ISCHEMIC COLITIS, HX OF 10/01/2007  . MENOPAUSAL DISORDER 11/07/2009  . OBESITY, MILD 08/20/2007  . OSTEOPENIA 06/16/2010  . POLYARTHRALGIA 11/06/2008  . Raynaud's syndrome 08/20/2007   Past Surgical History:  Procedure Laterality Date  . TONSILLECTOMY     Social History  Substance Use Topics  . Smoking status: Former Smoker    Quit date: 12/27/1990  . Smokeless tobacco: Never Used  . Alcohol use Yes     Comment: one drink per day   No Known Allergies Family History  Problem  Relation Age of Onset  . Osteoporosis Mother   . ADD / ADHD Daughter   . Osteoporosis Maternal Grandmother   . Breast cancer Maternal Grandmother   . Coronary artery disease Other   . Hypertension Other   . Prostate cancer Other   . Colon cancer Neg Hx      Past medical history, social, surgical and family history all reviewed in electronic medical record.   Review of Systems: No headache, visual changes, nausea, vomiting, diarrhea, constipation, dizziness, abdominal pain, skin rash, fevers, chills, night sweats, weight loss, swollen lymph nodes,chest pain, shortness of breath, mood changes.  Positive joint pain and swelling she states intermittently.  Objective  Blood pressure 130/70, pulse 72, height 5\' 3"  (1.6 m), weight 162 lb (73.5 kg), SpO2 98 %.  Systems examined below as of 01/19/17 General: NAD A&O x3 mood, affect normal  HEENT: Pupils equal, extraocular movements intact no nystagmus Respiratory: not short of breath at rest or with speaking Cardiovascular: No lower extremity edema, non tender Skin: Warm dry intact with no signs of infection or rash on extremities or on axial skeleton. Abdomen: Soft nontender, no masses Neuro: Cranial nerves  intact, neurovascularly intact in all extremities with 2+ DTRs and 2+ pulses. Lymph: No lymphadenopathy appreciated today  Gait normal with good balance and coordination.  MSK: Non tender with full range of motion and good stability and symmetric strength and tone of shoulders, elbows, wrist,  hips and ankles bilaterally.  Knee: Left Normal to inspection with no erythema or effusion or obvious bony abnormalities. Palpation normal with no warmth, joint line tenderness, patellar tenderness, or condyle tenderness. She does have some tenderness over the distal aspect of the hamstring. ROM full in flexion and extension and lower leg rotation. Ligaments with solid consistent endpoints including ACL, PCL, LCL, MCL. Negative Mcmurray's,  Apley's, and Thessalonian tests. Non painful patellar compression. Patellar glide without crepitus. Patellar and quadriceps tendons unremarkable. Hamstring and quadriceps strength is normal. Does have tightness of the hamstring compared to the contralateral side.  Back Exam:  Inspection: Unremarkable  Motion: Flexion 45 deg, Extension 25 deg, Side Bending to 45 deg bilaterally,  Rotation to 35 deg bilaterally  SLR laying: Negative  XSLR laying: Negative  Palpable tenderness tender to palpation over the sacroiliac joint.Marland Kitchen FABER: Positive right side. Sensory change: Gross sensation intact to all lumbar and sacral dermatomes.  Reflexes: 2+ at both patellar tendons, 2+ at achilles tendons, Babinski's downgoing.  Strength at foot  Plantar-flexion: 5/5 Dorsi-flexion: 5/5 Eversion: 5/5 Inversion: 5/5  Leg strength  Quad: 5/5 Hamstring: 5/5 Hip flexor: 5/5 Hip abductors: 5/5  Gait unremarkable.  Procedure note [id number]; 15 minutes spent for Therapeutic exercises as stated in above notes.  This included exercises focusing on stretching, strengthening, with significant focus on eccentric aspects.  Hamstring exercises including eccentric's and askling  Proper technique shown and discussed handout in great detail with ATC.  All questions were discussed and answered.   Osteopathic findings Cervical C5 flexed rotated and side bent left T3 extended rotated and side bent right inhaled third rib T9 extended rotated and side bent left L2 flexed rotated and side bent right Sacrum right on right    Impression and Recommendations:     This case required medical decision making of moderate complexity.

## 2017-01-19 ENCOUNTER — Ambulatory Visit (INDEPENDENT_AMBULATORY_CARE_PROVIDER_SITE_OTHER): Payer: Commercial Managed Care - PPO | Admitting: Internal Medicine

## 2017-01-19 ENCOUNTER — Ambulatory Visit (INDEPENDENT_AMBULATORY_CARE_PROVIDER_SITE_OTHER): Payer: Commercial Managed Care - PPO | Admitting: Family Medicine

## 2017-01-19 ENCOUNTER — Encounter: Payer: Self-pay | Admitting: Family Medicine

## 2017-01-19 ENCOUNTER — Encounter: Payer: Self-pay | Admitting: Internal Medicine

## 2017-01-19 VITALS — BP 130/70 | HR 72 | Temp 98.7°F | Resp 20 | Wt 162.0 lb

## 2017-01-19 VITALS — BP 130/70 | HR 72 | Ht 63.0 in | Wt 162.0 lb

## 2017-01-19 DIAGNOSIS — M533 Sacrococcygeal disorders, not elsewhere classified: Secondary | ICD-10-CM

## 2017-01-19 DIAGNOSIS — Z0001 Encounter for general adult medical examination with abnormal findings: Secondary | ICD-10-CM | POA: Diagnosis not present

## 2017-01-19 DIAGNOSIS — M76892 Other specified enthesopathies of left lower limb, excluding foot: Secondary | ICD-10-CM

## 2017-01-19 DIAGNOSIS — R339 Retention of urine, unspecified: Secondary | ICD-10-CM

## 2017-01-19 DIAGNOSIS — N390 Urinary tract infection, site not specified: Secondary | ICD-10-CM | POA: Diagnosis not present

## 2017-01-19 DIAGNOSIS — M999 Biomechanical lesion, unspecified: Secondary | ICD-10-CM

## 2017-01-19 MED ORDER — TRAMADOL HCL 50 MG PO TABS: 50.0000 mg | ORAL_TABLET | Freq: Four times a day (QID) | ORAL | 2 refills | Status: DC | PRN

## 2017-01-19 MED ORDER — GABAPENTIN 100 MG PO CAPS: 200.0000 mg | ORAL_CAPSULE | Freq: Every day | ORAL | 3 refills | Status: DC

## 2017-01-19 MED ORDER — ROSUVASTATIN CALCIUM 10 MG PO TABS: 10.0000 mg | ORAL_TABLET | Freq: Every day | ORAL | 3 refills | Status: DC

## 2017-01-19 MED ORDER — ZOLPIDEM TARTRATE 10 MG PO TABS: 10.0000 mg | ORAL_TABLET | Freq: Every evening | ORAL | 1 refills | Status: DC | PRN

## 2017-01-19 MED ORDER — CEPHALEXIN 500 MG PO CAPS: 500.0000 mg | ORAL_CAPSULE | Freq: Four times a day (QID) | ORAL | 0 refills | Status: AC

## 2017-01-19 NOTE — Patient Instructions (Signed)
Good to see you  Ice 20 minutes 2 times daily. Usually after activity and before bed. Gabapentin 200mg  at night Try to keep hands within peripheral vision with lifting Bring handle bars up in spin and lower seat 1 level to decrease hamstring strain.  New exercises for hamstring See me again in 3-4 weeks.

## 2017-01-19 NOTE — Assessment & Plan Note (Signed)
Discuss compression, new problem. Topical anti-inflammatory's prescribed again. We have discussed the radicular symptoms could also be contribute in. Do not feel that this is likely the knee. I will patient continued have trouble further evaluation of the knee with x-rays and ultrasound.

## 2017-01-19 NOTE — Assessment & Plan Note (Signed)
Recurrence of pain at this time. We discussed icing regimen and home exercises. We discussed which activities to do an which was to avoid. We discussed the importance of core strengthening and hip abductor strengthening. Did respond well to osteopathic manipulation. Follow-up again in 4-6 weeks.

## 2017-01-19 NOTE — Assessment & Plan Note (Signed)
Decision today to treat with OMT was based on Physical Exam  After verbal consent patient was treated with HVLA, ME, FPR techniques in cervical, lumbar and sacral areas  Patient tolerated the procedure well with improvement in symptoms  Patient given exercises, stretches and lifestyle modifications  See medications in patient instructions if given  Patient will follow up in 4-6 weeks

## 2017-01-19 NOTE — Progress Notes (Signed)
Pre visit review using our clinic review tool, if applicable. No additional management support is needed unless otherwise documented below in the visit note. 

## 2017-01-19 NOTE — Patient Instructions (Signed)
Please take all new medication as prescribed - the antibiotic  You will be contacted regarding the referral for: urology  Please continue all other medications as before, and refills have been done if requested - the tramadol and ambien  Please have the pharmacy call with any other refills you may need.  Please continue your efforts at being more active, low cholesterol diet, and weight control.  You are otherwise up to date with prevention measures today.  Please keep your appointments with your specialists as you may have planned  Please return in 1 year for your yearly visit, or sooner if needed, with Lab testing done 3-5 days before

## 2017-01-19 NOTE — Progress Notes (Signed)
Subjective:    Patient ID: Annette [name], female    DOB: [birth year], 61 y.o.   MRN: [id number]  HPI  Here for wellness and f/u;  Overall doing ok;  Pt denies Chest pain, worsening SOB, DOE, wheezing, orthopnea, PND, worsening LE edema, palpitations, dizziness or syncope.  Pt denies neurological change such as new headache, facial or extremity weakness.  Pt denies polydipsia, polyuria, or low sugar symptoms. Pt states overall good compliance with treatment and medications, good tolerability, and has been trying to follow appropriate diet.  Pt denies worsening depressive symptoms, suicidal ideation or panic. No fever, night sweats, wt loss, loss of appetite, or other constitutional symptoms.  Pt states good ability with ADL's, has low fall risk, home safety reviewed and adequate, no other significant changes in hearing or vision, and only occasionally active with exercise.  Does have mild dysuria and freq for the past wk, but Denies urinary symptoms such as urgency, flank pain, hematuria or n/v, fever, chills.  Does also have urinary retention intermittnet for many months, asks for urology referral Past Medical History:  Diagnosis Date  . ALLERGIC RHINITIS 10/01/2007  . ANXIETY 11/10/2010  . ATTENTION DEFICIT DISORDER 11/10/2010  . BARTHOLIN'S CYST 08/20/2007  . Carpal tunnel syndrome 08/20/2007  . COLONIC POLYPS, HX OF 08/20/2007  . DEPRESSION 08/20/2007  . EMPHYSEMA 06/16/2010  . HOT FLASHES 09/26/2009  . HYPERLIPIDEMIA 08/20/2007  . HYPERTENSION 08/20/2007  . Insomnia, unspecified 10/01/2007  . ISCHEMIC COLITIS, HX OF 10/01/2007  . MENOPAUSAL DISORDER 11/07/2009  . OBESITY, MILD 08/20/2007  . OSTEOPENIA 06/16/2010  . POLYARTHRALGIA 11/06/2008  . Raynaud's syndrome 08/20/2007   Past Surgical History:  Procedure Laterality Date  . TONSILLECTOMY      reports that she quit smoking about 26 years ago. She has never used smokeless tobacco. She reports that she drinks alcohol. She reports that she does  not use drugs. family history includes ADD / ADHD in her daughter; Breast cancer in her maternal grandmother; Coronary artery disease in her other; Hypertension in her other; Osteoporosis in her maternal grandmother and mother; Prostate cancer in her other. No Known Allergies Current Outpatient Prescriptions on File Prior to Visit  Medication Sig Dispense Refill  . aspirin EC 81 MG tablet Take 1 tablet (81 mg total) by mouth daily. 90 tablet 11  . Diclofenac Sodium (PENNSAID) 2 % SOLN Place 2 application onto the skin 2 (two) times daily. 112 g 3  . Estradiol (VAGIFEM) 10 MCG TABS vaginal tablet Place 1 tablet (10 mcg total) vaginally 2 (two) times a week. 24 tablet 3  . ibuprofen (ADVIL,MOTRIN) 200 MG tablet Take 400 mg by mouth every 6 (six) hours as needed for moderate pain.    . meloxicam (MOBIC) 15 MG tablet Take 1 tablet (15 mg total) by mouth daily. 30 tablet 3   No current facility-administered medications on file prior to visit.    Review of Systems Constitutional: Negative for increased diaphoresis, or other activity, appetite or siginficant weight change other than noted HENT: Negative for worsening hearing loss, ear pain, facial swelling, mouth sores and neck stiffness.   Eyes: Negative for other worsening pain, redness or visual disturbance.  Respiratory: Negative for choking or stridor Cardiovascular: Negative for other chest pain and palpitations.  Gastrointestinal: Negative for worsening diarrhea, blood in stool, or abdominal distention Genitourinary: Negative for hematuria, flank pain or change in urine volume.  Musculoskeletal: Negative for myalgias or other joint complaints.  Skin: Negative for other color change  and wound or drainage.  Neurological: Negative for syncope and numbness. other than noted Hematological: Negative for adenopathy. or other swelling Psychiatric/Behavioral: Negative for hallucinations, SI, self-injury, decreased concentration or other worsening  agitation.  All other system neg per pt    Objective:   Physical Exam BP 130/70   Pulse 72   Temp 98.7 F (37.1 C) (Oral)   Resp 20   Wt 162 lb (73.5 kg)   SpO2 98%   BMI 28.70 kg/m  VS noted,  Constitutional: Pt is oriented to person, place, and time. Appears well-developed and well-nourished, in no significant distress Head: Normocephalic and atraumatic  Eyes: Conjunctivae and EOM are normal. Pupils are equal, round, and reactive to light Right Ear: External ear normal.  Left Ear: External ear normal Nose: Nose normal.  Mouth/Throat: Oropharynx is clear and moist  Neck: Normal range of motion. Neck supple. No JVD present. No tracheal deviation present or significant neck LA or mass Cardiovascular: Normal rate, regular rhythm, normal heart sounds and intact distal pulses.   Pulmonary/Chest: Effort normal and breath sounds without rales or wheezing  Abdominal: Soft. Bowel sounds are normal.. No HSM , + low mid abd tender without guarding or rebound Musculoskeletal: Normal range of motion. Exhibits no edema Lymphadenopathy: Has no cervical adenopathy.  Neurological: Pt is alert and oriented to person, place, and time. Pt has normal reflexes. No cranial nerve deficit. Motor grossly intact Skin: Skin is warm and dry. No rash noted or new ulcers Psychiatric:  Has normal mood and affect. Behavior is normal.  No other new exam findings    Assessment & Plan:

## 2017-01-23 NOTE — Assessment & Plan Note (Signed)
Etiology unclear, chronic, for urology referral

## 2017-01-23 NOTE — Assessment & Plan Note (Signed)
Mild to mod, for antibx course,  to f/u any worsening symptoms or concerns 

## 2017-01-23 NOTE — Assessment & Plan Note (Signed)

## 2017-02-16 ENCOUNTER — Encounter: Payer: Self-pay | Admitting: Family Medicine

## 2017-02-16 ENCOUNTER — Ambulatory Visit (INDEPENDENT_AMBULATORY_CARE_PROVIDER_SITE_OTHER): Payer: Commercial Managed Care - PPO | Admitting: Family Medicine

## 2017-02-16 VITALS — BP 102/72 | HR 58 | Ht 63.0 in | Wt 162.0 lb

## 2017-02-16 DIAGNOSIS — M999 Biomechanical lesion, unspecified: Secondary | ICD-10-CM

## 2017-02-16 DIAGNOSIS — M533 Sacrococcygeal disorders, not elsewhere classified: Secondary | ICD-10-CM

## 2017-02-16 NOTE — Assessment & Plan Note (Signed)
Patient does have some tenderness noted. Denies any radiation or any numbness. She increasing continue strengthening exercises. Discussed continuing the gabapentin if needed. Patient will follow-up with me again in 6-8 weeks.

## 2017-02-16 NOTE — Progress Notes (Signed)
[name] [address], [place] [address] Phone: [phone] Subjective:     CC: back pain f/u   [id number]  Annette [name] is a 61 y.o. female coming in with complaint of low back pain. Patient has been seen previously for osteopathic manipulation. Mild  Neck pain previously doing better with the gabapentin.  No side effects and feeling better.  Still lifting regularly and feeling better.  Patient denies any new symptoms. Would like to get the gabapentin if possible. Nothing stopping from activity.  Doing well.       Past Medical History:  Diagnosis Date  . ALLERGIC RHINITIS 10/01/2007  . ANXIETY 11/10/2010  . ATTENTION DEFICIT DISORDER 11/10/2010  . BARTHOLIN'S CYST 08/20/2007  . Carpal tunnel syndrome 08/20/2007  . COLONIC POLYPS, HX OF 08/20/2007  . DEPRESSION 08/20/2007  . EMPHYSEMA 06/16/2010  . HOT FLASHES 09/26/2009  . HYPERLIPIDEMIA 08/20/2007  . HYPERTENSION 08/20/2007  . Insomnia, unspecified 10/01/2007  . ISCHEMIC COLITIS, HX OF 10/01/2007  . MENOPAUSAL DISORDER 11/07/2009  . OBESITY, MILD 08/20/2007  . OSTEOPENIA 06/16/2010  . POLYARTHRALGIA 11/06/2008  . Raynaud's syndrome 08/20/2007   Past Surgical History:  Procedure Laterality Date  . TONSILLECTOMY     Social History  Substance Use Topics  . Smoking status: Former Smoker    Quit date: 12/27/1990  . Smokeless tobacco: Never Used  . Alcohol use Yes     Comment: one drink per day   No Known Allergies Family History  Problem Relation Age of Onset  . Osteoporosis Mother   . ADD / ADHD Daughter   . Osteoporosis Maternal Grandmother   . Breast cancer Maternal Grandmother   . Coronary artery disease Other   . Hypertension Other   . Prostate cancer Other   . Colon cancer Neg Hx      Past medical history, social, surgical and family history all reviewed in electronic medical record.   Review of Systems: No headache, visual changes, nausea, vomiting, diarrhea,  constipation, dizziness, abdominal pain, skin rash, fevers, chills, night sweats, weight loss, swollen lymph nodes, body aches, joint swelling,, chest pain, shortness of breath, mood changes.  +muscle ache.    Objective  Blood pressure 102/72, pulse (!) 58, height 5\' 3"  (1.6 m), weight 162 lb (73.5 kg).  Systems examined below as of 02/16/17 General: NAD A&O x3 mood, affect normal  HEENT: Pupils equal, extraocular movements intact no nystagmus Respiratory: not short of breath at rest or with speaking Cardiovascular: No lower extremity edema, non tender Skin: Warm dry intact with no signs of infection or rash on extremities or on axial skeleton. Abdomen: Soft nontender, no masses Neuro: Cranial nerves  intact, neurovascularly intact in all extremities with 2+ DTRs and 2+ pulses. Lymph: No lymphadenopathy appreciated today  Gait normal with good balance and coordination.  MSK: Non tender with full range of motion and good stability and symmetric strength and tone of shoulders, elbows, wrist,  knee hips and ankles bilaterally.     Back Exam:  Inspection: Unremarkable  Motion: Flexion 45 deg, Extension 25 deg, Side Bending to 25 deg bilaterally,  Rotation to 25 deg bilaterally  SLR laying: Negative  XSLR laying: Negative  Palpable tenderness: Tenderness over the left sacroiliac joint. FABER: Side still positive. Sensory change: Gross sensation intact to all lumbar and sacral dermatomes.  Reflexes: 2+ at both patellar tendons, 2+ at achilles tendons, Babinski's downgoing.  Strength at foot  Plantar-flexion: 5/5 Dorsi-flexion: 5/5 Eversion: 5/5  Inversion: 5/5  Leg strength  Quad: 5/5 Hamstring: 5/5 Hip flexor: 5/5 Hip abductors: 5/5  Gait unremarkable.   Osteopathic findings Cervical C2 flexed rotated and side bent right C4 flexed rotated and side bent left C6 flexed rotated and side bent left T3 extended rotated and side bent right inhaled third rib T9 extended rotated and side  bent left L2 flexed rotated and side bent right Sacrum right on right     Impression and Recommendations:     This case required medical decision making of moderate complexity.

## 2017-02-16 NOTE — Patient Instructions (Addendum)
Good to see you  Annette [name] is your friend.  I am impressed  Keep it up  For the gabapentin can try 3 times a week for 2 weeks and then discontinue if you would like.  If pain comes back start again  See me again in 6 weeks!!!

## 2017-02-16 NOTE — Assessment & Plan Note (Signed)
Decision today to treat with OMT was based on Physical Exam  After verbal consent patient was treated with HVLA, ME, FPR techniques in cervical, thoracic, lumbar and sacral areas  Patient tolerated the procedure well with improvement in symptoms  Patient given exercises, stretches and lifestyle modifications  See medications in patient instructions if given  Patient will follow up in 4 weeks 

## 2017-03-01 DIAGNOSIS — N1339 Other hydronephrosis: Secondary | ICD-10-CM | POA: Diagnosis not present

## 2017-03-01 DIAGNOSIS — R350 Frequency of micturition: Secondary | ICD-10-CM | POA: Diagnosis not present

## 2017-03-29 NOTE — Progress Notes (Signed)
[name] [address], [place] [address] Phone: [phone] Subjective:     CC: back pain f/u   [id number]  Annette [name] is a 61 y.o. female coming in with complaint of low back pain. Patient has been seen previously for osteopathic manipulation. Patient states that the gabapentin has been significantly helpful. Patient states that very mild tenderness in the neck and lower back but nothing severe. Working out regularly. Feels like she has made significant improvement.      Past Medical History:  Diagnosis Date  . ALLERGIC RHINITIS 10/01/2007  . ANXIETY 11/10/2010  . ATTENTION DEFICIT DISORDER 11/10/2010  . BARTHOLIN'S CYST 08/20/2007  . Carpal tunnel syndrome 08/20/2007  . COLONIC POLYPS, HX OF 08/20/2007  . DEPRESSION 08/20/2007  . EMPHYSEMA 06/16/2010  . HOT FLASHES 09/26/2009  . HYPERLIPIDEMIA 08/20/2007  . HYPERTENSION 08/20/2007  . Insomnia, unspecified 10/01/2007  . ISCHEMIC COLITIS, HX OF 10/01/2007  . MENOPAUSAL DISORDER 11/07/2009  . OBESITY, MILD 08/20/2007  . OSTEOPENIA 06/16/2010  . POLYARTHRALGIA 11/06/2008  . Raynaud's syndrome 08/20/2007   Past Surgical History:  Procedure Laterality Date  . TONSILLECTOMY     Social History  Substance Use Topics  . Smoking status: Former Smoker    Quit date: 12/27/1990  . Smokeless tobacco: Never Used  . Alcohol use Yes     Comment: one drink per day   No Known Allergies Family History  Problem Relation Age of Onset  . Osteoporosis Mother   . ADD / ADHD Daughter   . Osteoporosis Maternal Grandmother   . Breast cancer Maternal Grandmother   . Coronary artery disease Other   . Hypertension Other   . Prostate cancer Other   . Colon cancer Neg Hx      Past medical history, social, surgical and family history all reviewed in electronic medical record.   Review of Systems: No headache, visual changes, nausea, vomiting, diarrhea, constipation, dizziness, abdominal pain, skin rash,  fevers, chills, night sweats, weight loss, swollen lymph nodes, body aches, joint swelling, muscle aches, chest pain, shortness of breath, mood changes.     Objective  Blood pressure 110/76, pulse 60, height 5\' 3"  (1.6 m), weight 160 lb (72.6 kg).  Systems examined below as of 03/30/17 General: NAD A&O x3 mood, affect normal  HEENT: Pupils equal, extraocular movements intact no nystagmus Respiratory: not short of breath at rest or with speaking Cardiovascular: No lower extremity edema, non tender Skin: Warm dry intact with no signs of infection or rash on extremities or on axial skeleton. Abdomen: Soft nontender, no masses Neuro: Cranial nerves  intact, neurovascularly intact in all extremities with 2+ DTRs and 2+ pulses. Lymph: No lymphadenopathy appreciated today  Gait normal with good balance and coordination.  MSK: Non tender with full range of motion and good stability and symmetric strength and tone of shoulders, elbows, wrist,  knee hips and ankles bilaterally.       Back Exam:  Inspection: Unremarkable  Motion: Flexion 40 deg, Extension 25 deg, Side Bending to 25 deg bilaterally,  Rotation to 25 deg bilaterally  SLR laying: Negative  XSLR laying: Negative  Palpable tenderness: Tenderness over the left sacroiliac joint as well as somewhat over the right lumbar junction on the left side. FABER: Still has tightness on the left side Sensory change: Gross sensation intact to all lumbar and sacral dermatomes.  Reflexes: 2+ at both patellar tendons, 2+ at achilles tendons, Babinski's downgoing.  Strength at foot  Plantar-flexion: 5/5 Dorsi-flexion: 5/5 Eversion: 5/5 Inversion: 5/5  Leg strength  Quad: 5/5 Hamstring: 5/5 Hip flexor: 5/5 Hip abductors: 5/5  Gait unremarkable. Tightness in the trapezius bilaterally   Osteopathic findings Cervical C2 flexed rotated and side bent right T3 extended rotated and side bent right inhaled third rib T7 extended rotated and side bent  left L2 flexed rotated and side bent right Sacrum right on right      Impression and Recommendations:     This case required medical decision making of moderate complexity.

## 2017-03-30 ENCOUNTER — Encounter: Payer: Self-pay | Admitting: Family Medicine

## 2017-03-30 ENCOUNTER — Ambulatory Visit (INDEPENDENT_AMBULATORY_CARE_PROVIDER_SITE_OTHER): Payer: Commercial Managed Care - PPO | Admitting: Family Medicine

## 2017-03-30 VITALS — BP 110/76 | HR 60 | Ht 63.0 in | Wt 160.0 lb

## 2017-03-30 DIAGNOSIS — M533 Sacrococcygeal disorders, not elsewhere classified: Secondary | ICD-10-CM | POA: Diagnosis not present

## 2017-03-30 DIAGNOSIS — M999 Biomechanical lesion, unspecified: Secondary | ICD-10-CM

## 2017-03-30 MED ORDER — MELOXICAM 15 MG PO TABS: 15.0000 mg | ORAL_TABLET | Freq: Every day | ORAL | 0 refills | Status: DC

## 2017-03-30 NOTE — Assessment & Plan Note (Signed)
Stable at the moment. Encourage her to continue to work on hip abductor strengthening. Patient will start doing more heel ups will  be beneficial. Follow upup again in 6 weeks

## 2017-03-30 NOTE — Assessment & Plan Note (Signed)
Decision today to treat with OMT was based on Physical Exam  After verbal consent patient was treated with HVLA, ME, FPR techniques in cervical, thoracic, rib lumbar and sacral areas  Patient tolerated the procedure well with improvement in symptoms  Patient given exercises, stretches and lifestyle modifications  See medications in patient instructions if given  Patient will follow up in 6 weeks 

## 2017-03-30 NOTE — Patient Instructions (Signed)
goto see you  Ice is your friend.  Play with the gabapentin from 100-300mg  at night See me again in 6 weeks!

## 2017-05-06 DIAGNOSIS — Z0100 Encounter for examination of eyes and vision without abnormal findings: Secondary | ICD-10-CM | POA: Diagnosis not present

## 2017-05-11 ENCOUNTER — Ambulatory Visit (INDEPENDENT_AMBULATORY_CARE_PROVIDER_SITE_OTHER): Payer: Commercial Managed Care - PPO | Admitting: Family Medicine

## 2017-05-11 ENCOUNTER — Encounter: Payer: Self-pay | Admitting: Family Medicine

## 2017-05-11 DIAGNOSIS — M999 Biomechanical lesion, unspecified: Secondary | ICD-10-CM | POA: Diagnosis not present

## 2017-05-11 DIAGNOSIS — M533 Sacrococcygeal disorders, not elsewhere classified: Secondary | ICD-10-CM

## 2017-05-11 NOTE — Assessment & Plan Note (Signed)
Decision today to treat with OMT was based on Physical Exam  After verbal consent patient was treated with HVLA, ME, FPR techniques in cervical, thoracic, lumbar and sacral areas  Patient tolerated the procedure well with improvement in symptoms  Patient given exercises, stretches and lifestyle modifications  See medications in patient instructions if given  Patient will follow up in 8 weeks No change in management otherwise.

## 2017-05-11 NOTE — Progress Notes (Signed)
[name] [address], [place] [address] Phone: [phone] Subjective:     CC: back pain f/u   [id number]  Annette [name] is a 61 y.o. female coming in with complaint of low back pain. Patient has been seen previously for osteopathic manipulation. Patient states that the gabapentin has been significantly helpful. Patient states that very mild tenderness in the neck and lower back but nothing severe. Working out regularly.Taking the gabapentin fairly regularly and that has helped. No new findings.      Past Medical History:  Diagnosis Date  . ALLERGIC RHINITIS 10/01/2007  . ANXIETY 11/10/2010  . ATTENTION DEFICIT DISORDER 11/10/2010  . BARTHOLIN'S CYST 08/20/2007  . Carpal tunnel syndrome 08/20/2007  . COLONIC POLYPS, HX OF 08/20/2007  . DEPRESSION 08/20/2007  . EMPHYSEMA 06/16/2010  . HOT FLASHES 09/26/2009  . HYPERLIPIDEMIA 08/20/2007  . HYPERTENSION 08/20/2007  . Insomnia, unspecified 10/01/2007  . ISCHEMIC COLITIS, HX OF 10/01/2007  . MENOPAUSAL DISORDER 11/07/2009  . OBESITY, MILD 08/20/2007  . OSTEOPENIA 06/16/2010  . POLYARTHRALGIA 11/06/2008  . Raynaud's syndrome 08/20/2007   Past Surgical History:  Procedure Laterality Date  . TONSILLECTOMY     Social History  Substance Use Topics  . Smoking status: Former Smoker    Quit date: 12/27/1990  . Smokeless tobacco: Never Used  . Alcohol use Yes     Comment: one drink per day   No Known Allergies Family History  Problem Relation Age of Onset  . Osteoporosis Mother   . ADD / ADHD Daughter   . Osteoporosis Maternal Grandmother   . Breast cancer Maternal Grandmother   . Coronary artery disease Other   . Hypertension Other   . Prostate cancer Other   . Colon cancer Neg Hx      Past medical history, social, surgical and family history all reviewed in electronic medical record.   Review of Systems: No headache, visual changes, nausea, vomiting, diarrhea, constipation, dizziness,  abdominal pain, skin rash, fevers, chills, night sweats, weight loss, swollen lymph nodes, body aches, joint swelling, muscle aches, chest pain, shortness of breath, mood changes.     Objective  Blood pressure 102/68, pulse (!) 50, height 5\' 3"  (1.6 m), weight 160 lb (72.6 kg).  Systems examined below as of 05/11/17 General: NAD A&O x3 mood, affect normal  HEENT: Pupils equal, extraocular movements intact no nystagmus Respiratory: not short of breath at rest or with speaking Cardiovascular: No lower extremity edema, non tender Skin: Warm dry intact with no signs of infection or rash on extremities or on axial skeleton. Abdomen: Soft nontender, no masses Neuro: Cranial nerves  intact, neurovascularly intact in all extremities with 2+ DTRs and 2+ pulses. Lymph: No lymphadenopathy appreciated today  Gait normal with good balance and coordination.  MSK: Non tender with full range of motion and good stability and symmetric strength and tone of shoulders, elbows, wrist,  knee hips and ankles bilaterally.     Back Exam:  Inspection: Unremarkable  Motion: Flexion 45 deg, Extension 25 deg, Side Bending to 35 deg bilaterally,  Rotation to 35 deg bilaterally  SLR laying: Negative  XSLR laying: Negative  Palpable tenderness: None. FABER: negative. Sensory change: Gross sensation intact to all lumbar and sacral dermatomes.  Reflexes: 2+ at both patellar tendons, 2+ at achilles tendons, Babinski's downgoing.  Strength at foot  Plantar-flexion: 5/5 Dorsi-flexion: 5/5 Eversion: 5/5 Inversion: 5/5  Leg strength  Quad: 5/5 Hamstring: 5/5 Hip flexor: 5/5 Hip abductors:  5/5  Gait unremarkable. Mild tightness of the left trapezius only  Osteopathic findings C2 flexed rotated and side bent right C4 flexed rotated and side bent left C6 flexed rotated and side bent left T3 extended rotated and side bent right inhaled third rib T9 extended rotated and side bent left L2 flexed rotated and side bent  right Sacrum right on right    Impression and Recommendations:     This case required medical decision making of moderate complexity.

## 2017-05-11 NOTE — Assessment & Plan Note (Signed)
Patient has been doing significantly better. We discussed icing regimen and home exercises. We discussed again about core strengthening. Patient has been able to remain active. Patient will continue to increase activity as tolerated. Patient will see me again in 2 months.

## 2017-05-11 NOTE — Patient Instructions (Signed)
Good to see you Overall doing well.  Keep it up  When taking the meloxicam daily for 3 days when you feel you need it.  See me again in 2 months!!!

## 2017-05-20 ENCOUNTER — Other Ambulatory Visit: Payer: Self-pay | Admitting: Family Medicine

## 2017-05-24 NOTE — Telephone Encounter (Signed)
Refill done.  

## 2017-06-06 ENCOUNTER — Other Ambulatory Visit: Payer: Self-pay | Admitting: Family Medicine

## 2017-07-07 ENCOUNTER — Other Ambulatory Visit: Payer: Self-pay | Admitting: Family Medicine

## 2017-07-07 NOTE — Telephone Encounter (Signed)
Refill done.  

## 2017-07-13 ENCOUNTER — Ambulatory Visit: Payer: Commercial Managed Care - PPO | Admitting: Family Medicine

## 2017-07-27 ENCOUNTER — Ambulatory Visit: Payer: Commercial Managed Care - PPO | Admitting: Family Medicine

## 2017-08-01 ENCOUNTER — Ambulatory Visit (INDEPENDENT_AMBULATORY_CARE_PROVIDER_SITE_OTHER): Payer: Commercial Managed Care - PPO | Admitting: Family Medicine

## 2017-08-01 ENCOUNTER — Encounter: Payer: Self-pay | Admitting: Family Medicine

## 2017-08-01 VITALS — BP 100/70 | HR 52 | Ht 63.0 in | Wt 160.0 lb

## 2017-08-01 DIAGNOSIS — M999 Biomechanical lesion, unspecified: Secondary | ICD-10-CM | POA: Diagnosis not present

## 2017-08-01 DIAGNOSIS — M533 Sacrococcygeal disorders, not elsewhere classified: Secondary | ICD-10-CM | POA: Diagnosis not present

## 2017-08-01 NOTE — Patient Instructions (Addendum)
Good to see you  Ice is your friend  You are doing great overall  Do not change a thing.  Have a great time at the beach  See me again in 8 weeks

## 2017-08-01 NOTE — Assessment & Plan Note (Signed)
Patient is doing relatively well overall. Seems to be stable. No significant tenderness out of the ordinary. We discussed core instability. Patient will continue with conservative therapy and follow-up with me again in 8 weeks.

## 2017-08-01 NOTE — Progress Notes (Signed)
[name] [address] [place], [place] [address] Phone: [phone] Subjective:     CC: back pain f/u   [id number]  Annette [name] is a 61 y.o. female coming in with complaint of low back pain. Patient has been seen previously for osteopathic manipulation. Has done well. Patient states that over the course last 2 weeks has had some significant pain. States some more tightness. Denies any numbness or radiation. Patient rates the severity of pain as 5 out of 10. Seems to be worsening though recently.      Past Medical History:  Diagnosis Date  . ALLERGIC RHINITIS 10/01/2007  . ANXIETY 11/10/2010  . ATTENTION DEFICIT DISORDER 11/10/2010  . BARTHOLIN'S CYST 08/20/2007  . Carpal tunnel syndrome 08/20/2007  . COLONIC POLYPS, HX OF 08/20/2007  . DEPRESSION 08/20/2007  . EMPHYSEMA 06/16/2010  . HOT FLASHES 09/26/2009  . HYPERLIPIDEMIA 08/20/2007  . HYPERTENSION 08/20/2007  . Insomnia, unspecified 10/01/2007  . ISCHEMIC COLITIS, HX OF 10/01/2007  . MENOPAUSAL DISORDER 11/07/2009  . OBESITY, MILD 08/20/2007  . OSTEOPENIA 06/16/2010  . POLYARTHRALGIA 11/06/2008  . Raynaud's syndrome 08/20/2007   Past Surgical History:  Procedure Laterality Date  . TONSILLECTOMY     Social History  Substance Use Topics  . Smoking status: Former Smoker    Quit date: 12/27/1990  . Smokeless tobacco: Never Used  . Alcohol use Yes     Comment: one drink per day   No Known Allergies Family History  Problem Relation Age of Onset  . Osteoporosis Mother   . ADD / ADHD Daughter   . Osteoporosis Maternal Grandmother   . Breast cancer Maternal Grandmother   . Coronary artery disease Other   . Hypertension Other   . Prostate cancer Other   . Colon cancer Neg Hx      Past medical history, social, surgical and family history all reviewed in electronic medical record.   Review of Systems: No headache, visual changes, nausea, vomiting, diarrhea, constipation, dizziness,  abdominal pain, skin rash, fevers, chills, night sweats, weight loss, swollen lymph nodes, body aches, joint swelling, muscle aches, chest pain, shortness of breath, mood changes.     Objective  Blood pressure 100/70, pulse (!) 52, height 5\' 3"  (1.6 m), weight 160 lb (72.6 kg).  Systems examined below as of 08/01/17 General: NAD A&O x3 mood, affect normal  HEENT: Pupils equal, extraocular movements intact no nystagmus Respiratory: not short of breath at rest or with speaking Cardiovascular: No lower extremity edema, non tender Skin: Warm dry intact with no signs of infection or rash on extremities or on axial skeleton. Abdomen: Soft nontender, no masses Neuro: Cranial nerves  intact, neurovascularly intact in all extremities with 2+ DTRs and 2+ pulses. Lymph: No lymphadenopathy appreciated today  Gait normal with good balance and coordination.  MSK: Non tender with full range of motion and good stability and symmetric strength and tone of shoulders, elbows, wrist,  knee hips and ankles bilaterally.     Back Exam:  Inspection: Unremarkable  Motion: Flexion 45 deg, Extension 25 deg, Side Bending to 45 deg bilaterally,  Rotation to 45 deg bilaterally  SLR laying: Negative  XSLR laying: Negative  Palpable tenderness: Mild tightness and tenderness over the right sacroiliac joint. FABER: negative. Sensory change: Gross sensation intact to all lumbar and sacral dermatomes.  Reflexes: 2+ at both patellar tendons, 2+ at achilles tendons, Babinski's downgoing.  Strength at foot  Plantar-flexion: 5/5 Dorsi-flexion: 5/5 Eversion: 5/5 Inversion: 5/5  Leg strength  Quad: 5/5 Hamstring: 5/5 Hip flexor: 5/5 Hip abductors: 5/5  Gait unremarkable. Continues to have significant tightness of the trapezius but now seems to be bilateral  Osteopathic findings C3 flexed rotated and side bent right C7 flexed rotated and side bent left T3 extended rotated and side bent left inhaled third rib T9  extended rotated and side bent left L3 flexed rotated and side bent right Sacrum right on right     Impression and Recommendations:     This case required medical decision making of moderate complexity.

## 2017-08-01 NOTE — Assessment & Plan Note (Signed)
Decision today to treat with OMT was based on Physical Exam  After verbal consent patient was treated with HVLA, ME, FPR techniques in cervical, thoracic, rib lumbar and sacral areas  Patient tolerated the procedure well with improvement in symptoms  Patient given exercises, stretches and lifestyle modifications  See medications in patient instructions if given  Patient will follow up in 8 weeks 

## 2017-08-10 DIAGNOSIS — N1339 Other hydronephrosis: Secondary | ICD-10-CM | POA: Diagnosis not present

## 2017-09-28 ENCOUNTER — Ambulatory Visit: Payer: Commercial Managed Care - PPO | Admitting: Family Medicine

## 2017-10-05 ENCOUNTER — Ambulatory Visit (INDEPENDENT_AMBULATORY_CARE_PROVIDER_SITE_OTHER): Payer: Commercial Managed Care - PPO | Admitting: Family Medicine

## 2017-10-05 ENCOUNTER — Encounter: Payer: Self-pay | Admitting: Family Medicine

## 2017-10-05 VITALS — BP 110/70 | HR 55 | Ht 63.0 in | Wt 162.0 lb

## 2017-10-05 DIAGNOSIS — M2550 Pain in unspecified joint: Secondary | ICD-10-CM | POA: Diagnosis not present

## 2017-10-05 DIAGNOSIS — M999 Biomechanical lesion, unspecified: Secondary | ICD-10-CM

## 2017-10-05 NOTE — Assessment & Plan Note (Signed)
Decision today to treat with OMT was based on Physical Exam  After verbal consent patient was treated with HVLA, ME, FPR techniques in cervical, thoracic, lumbar and sacral areas  Patient tolerated the procedure well with improvement in symptoms  Patient given exercises, stretches and lifestyle modifications  See medications in patient instructions if given  Patient will follow up in 6 weeks 

## 2017-10-05 NOTE — Assessment & Plan Note (Signed)
Patient is more of an polyarthralgia. Discussed with patient at great length about icing regimen, home exercise, which activities to do a which was to avoid. Patient will start to increase activity slowly over the course of next several days. Patient was a weeks out from the previous visit. I do feel that that may be too long. Patient will follow-up with me again in 6 weeks.

## 2017-10-05 NOTE — Progress Notes (Signed)
[name] [address] [place], [place] [address] Phone: [phone] Subjective:    I'm seeing this patient by the request  of:    CC: Back and neck pain follow-up  [id number]  Annette [name] is a 61 y.o. female coming in for follow up for shoulder pain and posture. She has been taking gabapentin much less than before. She has been using it 3-4 nights in a row recently due to a flare up. Once she gets her pain under control she is able to go 2-3 weeks without pain. She feels that the pain has shifted to the back of her shoulder than on the top of the shoulder. She is still able to be active but does not feel fully healed.        Past Medical History:  Diagnosis Date  . ALLERGIC RHINITIS 10/01/2007  . ANXIETY 11/10/2010  . ATTENTION DEFICIT DISORDER 11/10/2010  . BARTHOLIN'S CYST 08/20/2007  . Carpal tunnel syndrome 08/20/2007  . COLONIC POLYPS, HX OF 08/20/2007  . DEPRESSION 08/20/2007  . EMPHYSEMA 06/16/2010  . HOT FLASHES 09/26/2009  . HYPERLIPIDEMIA 08/20/2007  . HYPERTENSION 08/20/2007  . Insomnia, unspecified 10/01/2007  . ISCHEMIC COLITIS, HX OF 10/01/2007  . MENOPAUSAL DISORDER 11/07/2009  . OBESITY, MILD 08/20/2007  . OSTEOPENIA 06/16/2010  . POLYARTHRALGIA 11/06/2008  . Raynaud's syndrome 08/20/2007   Past Surgical History:  Procedure Laterality Date  . TONSILLECTOMY     Social History   Social History  . Marital status: Married    Spouse name: N/A  . Number of children: 1  . Years of education: N/A   Occupational History  .  [employer]   Social History Main Topics  . Smoking status: Former Smoker    Quit date: 12/27/1990  . Smokeless tobacco: Never Used  . Alcohol use Yes     Comment: one drink per day  . Drug use: No  . Sexual activity: Not Asked   Other Topics Concern  . None   Social History Narrative  . None   No Known Allergies Family History  Problem Relation Age of Onset  . Osteoporosis Mother     . ADD / ADHD Daughter   . Osteoporosis Maternal Grandmother   . Breast cancer Maternal Grandmother   . Coronary artery disease Other   . Hypertension Other   . Prostate cancer Other   . Colon cancer Neg Hx      Past medical history, social, surgical and family history all reviewed in electronic medical record.  No pertanent information unless stated regarding to the chief complaint.   Review of Systems:Review of systems updated and as accurate as of 10/05/17  No headache, visual changes, nausea, vomiting, diarrhea, constipation, dizziness, abdominal pain, skin rash, fevers, chills, night sweats, weight loss, swollen lymph nodes, body aches, joint swelling, chest pain, shortness of breath, mood changes. Positive muscle aches  Objective  Blood pressure 110/70, pulse (!) 55, height 5\' 3"  (1.6 m), weight 162 lb (73.5 kg), SpO2 98 %. Systems examined below as of 10/05/17   General: No apparent distress alert and oriented x3 mood and affect normal, dressed appropriately.  HEENT: Pupils equal, extraocular movements intact  Respiratory: Patient's speak in full sentences and does not appear short of breath  Cardiovascular: No lower extremity edema, non tender, no erythema  Skin: Warm dry intact with no signs of infection or rash on extremities or on axial skeleton.  Abdomen: Soft nontender  Neuro: Cranial  nerves II through XII are intact, neurovascularly intact in all extremities with 2+ DTRs and 2+ pulses.  Lymph: No lymphadenopathy of posterior or anterior cervical chain or axillae bilaterally.  Gait normal with good balance and coordination.  MSK:  Non tender with full range of motion and good stability and symmetric strength and tone of shoulders, elbows, wrist, hip, knee and ankles bilaterally.  Neck: Inspection mild loss of lordosis. No palpable stepoffs. Negative Spurling's maneuver. Mild loss of left-sided rotation and side bending Grip strength and sensation normal in bilateral  hands Strength good C4 to T1 distribution No sensory change to C4 to T1 Negative Hoffman sign bilaterally Reflexes normal Increased tightness of the left trapezius muscle  Osteopathic findings C2 flexed rotated and side bent left C4 flexed rotated and side bent left C6 flexed rotated and side bent left T3 extended rotated and side bent right inhaled third rib T9 extended rotated and side bent right L2 flexed rotated and side bent right Sacrum left on left    Impression and Recommendations:     This case required medical decision making of moderate complexity.      Note: This dictation was prepared with Dragon dictation along with smaller phrase technology. Any transcriptional errors that result from this process are unintentional.

## 2017-10-05 NOTE — Patient Instructions (Signed)
Good to see you  Ice is your friend You know if you need can do the meloxicam and the gabapentin  Have a great weekend. See me again in 6 weeks.

## 2017-10-19 ENCOUNTER — Other Ambulatory Visit: Payer: Self-pay | Admitting: Internal Medicine

## 2017-10-20 NOTE — Telephone Encounter (Signed)
Denied, this is not a long term med last filled in January

## 2017-11-15 ENCOUNTER — Ambulatory Visit: Payer: Commercial Managed Care - PPO | Admitting: Family Medicine

## 2017-12-07 DIAGNOSIS — N952 Postmenopausal atrophic vaginitis: Secondary | ICD-10-CM | POA: Diagnosis not present

## 2017-12-07 DIAGNOSIS — R109 Unspecified abdominal pain: Secondary | ICD-10-CM | POA: Diagnosis not present

## 2017-12-07 DIAGNOSIS — Z1231 Encounter for screening mammogram for malignant neoplasm of breast: Secondary | ICD-10-CM | POA: Diagnosis not present

## 2017-12-07 DIAGNOSIS — Z01419 Encounter for gynecological examination (general) (routine) without abnormal findings: Secondary | ICD-10-CM | POA: Diagnosis not present

## 2017-12-14 ENCOUNTER — Ambulatory Visit: Payer: Commercial Managed Care - PPO | Admitting: Family Medicine

## 2018-01-22 NOTE — Progress Notes (Signed)
[name] [address] [address] [place], [place] [address] Phone: [phone] Subjective:    I'm seeing this patient by the request  of:  Biagio Borg, MD   CC: Low back pain  [id number]  Annette [name] is a 62 y.o. female coming in with complaint of low back pain.  Patient was found to have sacroiliac dysfunction.  Has responded very well to manipulation.  Patient was started on gabapentin and was doing better as well.  Last time seen was greater than 3 months ago.  Patient states that she has not had to take gabapentin that often. She is using meloxicam with gabapentin and this helps her pain stay away for about 3 weeks. Denies any worsening of symptoms. She has a standing desk but isn't using it at often as she should. Sitting for prolonged periods of time still aggravates her pain.   Paitent also notes pain in the left knee when she is running. Pain is on the medial aspect in the joint line. Pain is chronic in nature.  Patient states it is worse when she does dead lifts or when she is running.  Points to the medial aspect of her knee.  Denies any swelling or giving out on her.    Past Medical History:  Diagnosis Date  . ALLERGIC RHINITIS 10/01/2007  . ANXIETY 11/10/2010  . ATTENTION DEFICIT DISORDER 11/10/2010  . BARTHOLIN'S CYST 08/20/2007  . Carpal tunnel syndrome 08/20/2007  . COLONIC POLYPS, HX OF 08/20/2007  . DEPRESSION 08/20/2007  . EMPHYSEMA 06/16/2010  . HOT FLASHES 09/26/2009  . HYPERLIPIDEMIA 08/20/2007  . HYPERTENSION 08/20/2007  . Insomnia, unspecified 10/01/2007  . ISCHEMIC COLITIS, HX OF 10/01/2007  . MENOPAUSAL DISORDER 11/07/2009  . OBESITY, MILD 08/20/2007  . OSTEOPENIA 06/16/2010  . POLYARTHRALGIA 11/06/2008  . Raynaud's syndrome 08/20/2007   Past Surgical History:  Procedure Laterality Date  . TONSILLECTOMY     Social History   Socioeconomic History  . Marital status: Married    Spouse name: Not on file  . Number of children: 1  .  Years of education: Not on file  . Highest education level: Not on file  Social Needs  . Financial resource strain: Not on file  . Food insecurity - worry: Not on file  . Food insecurity - inability: Not on file  . Transportation needs - medical: Not on file  . Transportation needs - non-medical: Not on file  Occupational History    Employer: [employer]  Tobacco Use  . Smoking status: Former Smoker    Last attempt to quit: 12/27/1990    Years since quitting: 27.0  . Smokeless tobacco: Never Used  Substance and Sexual Activity  . Alcohol use: Yes    Comment: one drink per day  . Drug use: No  . Sexual activity: Not on file  Other Topics Concern  . Not on file  Social History Narrative  . Not on file   No Known Allergies Family History  Problem Relation Age of Onset  . Osteoporosis Mother   . ADD / ADHD Daughter   . Osteoporosis Maternal Grandmother   . Breast cancer Maternal Grandmother   . Coronary artery disease Other   . Hypertension Other   . Prostate cancer Other   . Colon cancer Neg Hx      Past medical history, social, surgical and family history all reviewed in electronic medical record.  No pertanent information unless stated regarding to the chief  complaint.   Review of Systems:Review of systems updated and as accurate as of 01/22/18  No headache, visual changes, nausea, vomiting, diarrhea, constipation, dizziness, abdominal pain, skin rash, fevers, chills, night sweats, weight loss, swollen lymph nodes, body aches, joint swelling, muscle aches, chest pain, shortness of breath, mood changes.   Objective  There were no vitals taken for this visit. Systems examined below as of 01/22/18   General: No apparent distress alert and oriented x3 mood and affect normal, dressed appropriately.  HEENT: Pupils equal, extraocular movements intact  Respiratory: Patient's speak in full sentences and does not appear short of breath  Cardiovascular: No lower  extremity edema, non tender, no erythema  Skin: Warm dry intact with no signs of infection or rash on extremities or on axial skeleton.  Abdomen: Soft nontender  Neuro: Cranial nerves II through XII are intact, neurovascularly intact in all extremities with 2+ DTRs and 2+ pulses.  Lymph: No lymphadenopathy of posterior or anterior cervical chain or axillae bilaterally.  Gait normal with good balance and coordination.  MSK:  Non tender with full range of motion and good stability and symmetric strength and tone of shoulders, elbows, wrist, hip, and ankles bilaterally.  Knee: Left Normal to inspection with no erythema or effusion or obvious bony abnormalities. Palpation normal with no warmth, joint line tenderness, patellar tenderness, or condyle tenderness.  Patient has some pain over the peds anserine area ROM full in flexion and extension and lower leg rotation.  Mild tightness of the hamstring Ligaments with solid consistent endpoints including ACL, PCL, LCL, MCL. Negative Mcmurray's, Apley's, and Thessalonian tests. Non painful patellar compression. Patellar glide without crepitus. Patellar and quadriceps tendons unremarkable. Hamstring and quadriceps strength is normal. Back Exam:  Inspection: Unremarkable  Motion: Flexion 45 deg, Extension 25 deg, Side Bending to 25 deg bilaterally,  Rotation to 45 deg bilaterally  SLR laying: Negative  XSLR laying: Negative  Palpable tenderness: Mild discomfort over the sacroiliac joint. FABER: Tightness on the left. Sensory change: Gross sensation intact to all lumbar and sacral dermatomes.  Reflexes: 2+ at both patellar tendons, 2+ at achilles tendons, Babinski's downgoing.  Strength at foot  Plantar-flexion: 5/5 Dorsi-flexion: 5/5 Eversion: 5/5 Inversion: 5/5  Leg strength  Quad: 5/5 Hamstring: 5/5 Hip flexor: 5/5 Hip abductors: 5/5  Gait unremarkable.  Osteopathic findings C2 flexed rotated and side bent right C4 flexed rotated and side  bent left C6 flexed rotated and side bent left T3 extended rotated and side bent right inhaled third rib T9 extended rotated and side bent left L2 flexed rotated and side bent right Sacrum right on right    Impression and Recommendations:     This case required medical decision making of moderate complexity.      Note: This dictation was prepared with Dragon dictation along with smaller phrase technology. Any transcriptional errors that result from this process are unintentional.

## 2018-01-23 ENCOUNTER — Encounter: Payer: Self-pay | Admitting: Family Medicine

## 2018-01-23 ENCOUNTER — Ambulatory Visit (INDEPENDENT_AMBULATORY_CARE_PROVIDER_SITE_OTHER): Payer: Commercial Managed Care - PPO | Admitting: Internal Medicine

## 2018-01-23 ENCOUNTER — Encounter: Payer: Self-pay | Admitting: Internal Medicine

## 2018-01-23 ENCOUNTER — Ambulatory Visit (INDEPENDENT_AMBULATORY_CARE_PROVIDER_SITE_OTHER): Payer: Commercial Managed Care - PPO | Admitting: Family Medicine

## 2018-01-23 VITALS — Ht 63.0 in

## 2018-01-23 VITALS — BP 108/82 | HR 51 | Ht 63.0 in | Wt 161.0 lb

## 2018-01-23 DIAGNOSIS — M533 Sacrococcygeal disorders, not elsewhere classified: Secondary | ICD-10-CM

## 2018-01-23 DIAGNOSIS — G4700 Insomnia, unspecified: Secondary | ICD-10-CM

## 2018-01-23 DIAGNOSIS — M76892 Other specified enthesopathies of left lower limb, excluding foot: Secondary | ICD-10-CM | POA: Diagnosis not present

## 2018-01-23 DIAGNOSIS — M999 Biomechanical lesion, unspecified: Secondary | ICD-10-CM

## 2018-01-23 MED ORDER — MELOXICAM 15 MG PO TABS: 15.0000 mg | ORAL_TABLET | Freq: Every day | ORAL | 3 refills | Status: DC

## 2018-01-23 MED ORDER — GABAPENTIN 100 MG PO CAPS: 200.0000 mg | ORAL_CAPSULE | Freq: Every day | ORAL | 3 refills | Status: DC

## 2018-01-23 NOTE — Patient Instructions (Signed)
Good to see you  Alvera Singh is your friend.  Hamstring exercises Thigh compression sleeve would be good. Consider tommy copper or [email or web address].  Back is doing great  pennsaid pinkie amount topically 2 times daily as needed.  See me again in 8 weeks

## 2018-01-23 NOTE — Assessment & Plan Note (Signed)
Decision today to treat with OMT was based on Physical Exam  After verbal consent patient was treated with HVLA, ME, FPR techniques in cervical, thoracic, lumbar and sacral areas  Patient tolerated the procedure well with improvement in symptoms  Patient given exercises, stretches and lifestyle modifications  See medications in patient instructions if given  Patient will follow up in 8 weeks 

## 2018-01-23 NOTE — Assessment & Plan Note (Signed)
Stable overall.  Continues to respond well to osteopathic manipulation.  Continue the same medications and refilled 1 year supply.  We discussed icing regimen and home exercises.  Discussed which activities of doing which wants to avoid.  Patient will follow up with me again in 8 weeks.

## 2018-01-23 NOTE — Assessment & Plan Note (Signed)
Mild worsening.  Discussed home exercises, compression sleeve, topical anti-inflammatories.  If worsening symptoms consider formal physical therapy

## 2018-01-24 NOTE — Patient Instructions (Signed)
none

## 2018-01-24 NOTE — Progress Notes (Signed)
error 

## 2018-01-26 ENCOUNTER — Other Ambulatory Visit (INDEPENDENT_AMBULATORY_CARE_PROVIDER_SITE_OTHER): Payer: Commercial Managed Care - PPO

## 2018-01-26 ENCOUNTER — Ambulatory Visit (INDEPENDENT_AMBULATORY_CARE_PROVIDER_SITE_OTHER): Payer: Commercial Managed Care - PPO | Admitting: Internal Medicine

## 2018-01-26 ENCOUNTER — Encounter: Payer: Self-pay | Admitting: Internal Medicine

## 2018-01-26 VITALS — BP 106/68 | HR 56 | Temp 97.6°F | Ht 63.0 in | Wt 159.0 lb

## 2018-01-26 DIAGNOSIS — F329 Major depressive disorder, single episode, unspecified: Secondary | ICD-10-CM | POA: Diagnosis not present

## 2018-01-26 DIAGNOSIS — E785 Hyperlipidemia, unspecified: Secondary | ICD-10-CM | POA: Diagnosis not present

## 2018-01-26 DIAGNOSIS — I10 Essential (primary) hypertension: Secondary | ICD-10-CM

## 2018-01-26 DIAGNOSIS — Z114 Encounter for screening for human immunodeficiency virus [HIV]: Secondary | ICD-10-CM

## 2018-01-26 DIAGNOSIS — F32A Depression, unspecified: Secondary | ICD-10-CM

## 2018-01-26 DIAGNOSIS — Z0001 Encounter for general adult medical examination with abnormal findings: Secondary | ICD-10-CM

## 2018-01-26 LAB — BASIC METABOLIC PANEL
BUN: 19 mg/dL (ref 6–23)
CO2: 27 mEq/L (ref 19–32)
Calcium: 9.4 mg/dL (ref 8.4–10.5)
Chloride: 107 mEq/L (ref 96–112)
Creatinine, Ser: 1.1 mg/dL (ref 0.40–1.20)
GFR: 65.3 mL/min (ref >60.00)
Glucose, Bld: 97 mg/dL (ref 70–99)
POTASSIUM: 4.2 meq/L (ref 3.5–5.1)
Sodium: 140 mEq/L (ref 135–145)

## 2018-01-26 LAB — URINALYSIS, ROUTINE W REFLEX MICROSCOPIC
Bilirubin Urine: NEGATIVE
Hgb urine dipstick: NEGATIVE
Ketones, ur: NEGATIVE
Nitrite: NEGATIVE
PH: 6 (ref 5.0–8.0)
RBC / HPF: NONE SEEN (ref 0–?)
SPECIFIC GRAVITY, URINE: 1.015 (ref 1.000–1.030)
TOTAL PROTEIN, URINE-UPE24: NEGATIVE
URINE GLUCOSE: NEGATIVE
Urobilinogen, UA: 0.2 (ref 0.0–1.0)

## 2018-01-26 LAB — CBC WITH DIFFERENTIAL/PLATELET
BASOS PCT: 0.8 % (ref 0.0–3.0)
Basophils Absolute: 0 10*3/uL (ref 0.0–0.1)
EOS ABS: 0.1 10*3/uL (ref 0.0–0.7)
Eosinophils Relative: 1.9 % (ref 0.0–5.0)
HEMATOCRIT: 37.7 % (ref 36.0–46.0)
Hemoglobin: 12.4 g/dL (ref 12.0–15.0)
LYMPHS ABS: 2.7 10*3/uL (ref 0.7–4.0)
LYMPHS PCT: 44.2 % (ref 12.0–46.0)
MCHC: 32.8 g/dL (ref 30.0–36.0)
MCV: 87 fl (ref 78.0–100.0)
Monocytes Absolute: 0.3 10*3/uL (ref 0.1–1.0)
Monocytes Relative: 5.3 % (ref 3.0–12.0)
NEUTROS ABS: 2.9 10*3/uL (ref 1.4–7.7)
Neutrophils Relative %: 47.8 % (ref 43.0–77.0)
PLATELETS: 240 10*3/uL (ref 150.0–400.0)
RBC: 4.34 Mil/uL (ref 3.87–5.11)
RDW: 14.5 % (ref 11.5–15.5)
WBC: 6.1 10*3/uL (ref 4.0–10.5)

## 2018-01-26 LAB — HEPATIC FUNCTION PANEL
ALT: 16 U/L (ref 0–35)
AST: 20 U/L (ref 0–37)
Albumin: 4.1 g/dL (ref 3.5–5.2)
Alkaline Phosphatase: 60 U/L (ref 39–117)
BILIRUBIN DIRECT: 0.1 mg/dL (ref 0.0–0.3)
TOTAL PROTEIN: 6.8 g/dL (ref 6.0–8.3)
Total Bilirubin: 0.3 mg/dL (ref 0.2–1.2)

## 2018-01-26 LAB — LIPID PANEL
CHOLESTEROL: 140 mg/dL (ref 0–200)
HDL: 52.2 mg/dL (ref >39.00)
LDL CALC: 73 mg/dL (ref 0–99)
NonHDL: 87.65
TRIGLYCERIDES: 74 mg/dL (ref 0.0–149.0)
Total CHOL/HDL Ratio: 3
VLDL: 14.8 mg/dL (ref 0.0–40.0)

## 2018-01-26 LAB — TSH: TSH: 2.4 u[IU]/mL (ref 0.35–4.50)

## 2018-01-26 MED ORDER — ZOSTER VAC RECOMB ADJUVANTED 50 MCG/0.5ML IM SUSR: 0.5000 mL | Freq: Once | INTRAMUSCULAR | 1 refills | Status: AC

## 2018-01-26 MED ORDER — ZOLPIDEM TARTRATE 10 MG PO TABS: 10.0000 mg | ORAL_TABLET | Freq: Every evening | ORAL | 1 refills | Status: DC | PRN

## 2018-01-26 MED ORDER — ROSUVASTATIN CALCIUM 10 MG PO TABS: 10.0000 mg | ORAL_TABLET | Freq: Every day | ORAL | 3 refills | Status: DC

## 2018-01-26 MED ORDER — ESCITALOPRAM OXALATE 10 MG PO TABS: 10.0000 mg | ORAL_TABLET | Freq: Every day | ORAL | 3 refills | Status: DC

## 2018-01-26 NOTE — Progress Notes (Signed)
Subjective:    Patient ID: Annette [name], female    DOB: Dec 04, 1956, 62 y.o.   MRN: [id number]  HPI  Here for wellness and f/u;  Overall doing ok;  Pt denies Chest pain, worsening SOB, DOE, wheezing, orthopnea, PND, worsening LE edema, palpitations, dizziness or syncope.  Pt denies neurological change such as new headache, facial or extremity weakness.  Pt denies polydipsia, polyuria, or low sugar symptoms. Pt states overall good compliance with treatment and medications, good tolerability, and has been trying to follow appropriate diet. No fever, night sweats, wt loss, loss of appetite, or other constitutional symptoms.  Pt states good ability with ADL's, has low fall risk, home safety reviewed and adequate, no other significant changes in hearing or vision, and trying to remain active with exercise with gym about 3 times per wk.   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  Getting better but still mild depressed without SI or HI.  Tries to minimize all of her pain meds, only uses when the shoulder and left back pain are acting up.  Also, only taking the statin qod.   Past Medical History:  Diagnosis Date  . ALLERGIC RHINITIS 10/01/2007  . ANXIETY 11/10/2010  . ATTENTION DEFICIT DISORDER 11/10/2010  . BARTHOLIN'S CYST 08/20/2007  . Carpal tunnel syndrome 08/20/2007  . COLONIC POLYPS, HX OF 08/20/2007  . DEPRESSION 08/20/2007  . EMPHYSEMA 06/16/2010  . HOT FLASHES 09/26/2009  . HYPERLIPIDEMIA 08/20/2007  . HYPERTENSION 08/20/2007  . Insomnia, unspecified 10/01/2007  . ISCHEMIC COLITIS, HX OF 10/01/2007  . MENOPAUSAL DISORDER 11/07/2009  . OBESITY, MILD 08/20/2007  . OSTEOPENIA 06/16/2010  . POLYARTHRALGIA 11/06/2008  . Raynaud's  syndrome 08/20/2007   Past Surgical History:  Procedure Laterality Date  . TONSILLECTOMY      reports that she quit smoking about 27 years ago. she has never used smokeless tobacco. She reports that she drinks alcohol. She reports that she does not use drugs. family history includes ADD / ADHD in her daughter; Breast cancer in her maternal grandmother; Coronary artery disease in her other; Hypertension in her other; Osteoporosis in her maternal grandmother and mother; Prostate cancer in her other. No Known Allergies Current Outpatient Medications on File Prior to Visit  Medication Sig Dispense Refill  . aspirin EC 81 MG tablet Take 1 tablet (81 mg total) by mouth daily. 90 tablet 11  . Diclofenac Sodium (PENNSAID) 2 % SOLN Place 2 application onto the skin 2 (two) times daily. 112 g 3  . Estradiol (VAGIFEM) 10 MCG TABS vaginal tablet Place 1 tablet (10 mcg total) vaginally 2 (two) times a week. 24 tablet 3  . gabapentin (NEURONTIN) 100 MG capsule Take 2 capsules (200 mg total) by mouth at bedtime. 180 capsule 3  . ibuprofen (ADVIL,MOTRIN) 200 MG tablet Take 400 mg by mouth every 6 (six) hours as needed for moderate pain.    . meloxicam (MOBIC) 15 MG tablet Take 1 tablet (15 mg total) by mouth daily. 90 tablet 3  . traMADol (ULTRAM) 50 MG tablet Take 1 tablet (50 mg total) by mouth every 6 (six) hours as needed for moderate pain. 30 tablet 2   No  current facility-administered medications on file prior to visit.    Review of Systems Constitutional: Negative for other unusual diaphoresis, sweats, appetite or weight changes HENT: Negative for other worsening hearing loss, ear pain, facial swelling, mouth sores or neck stiffness.   Eyes: Negative for other worsening pain, redness or other visual disturbance.  Respiratory: Negative for other stridor or swelling Cardiovascular: Negative for other palpitations or other chest pain  Gastrointestinal: Negative for worsening diarrhea or loose stools,  blood in stool, distention or other pain Genitourinary: Negative for hematuria, flank pain or other change in urine volume.  Musculoskeletal: Negative for myalgias or other joint swelling.  Skin: Negative for other color change, or other wound or worsening drainage.  Neurological: Negative for other syncope or numbness. Hematological: Negative for other adenopathy or swelling Psychiatric/Behavioral: Negative for hallucinations, other worsening agitation, SI, self-injury, or new decreased concentration \\All  other system neg per pt    Objective:   Physical Exam BP 106/68   Pulse (!) 56   Temp 97.6 F (36.4 C) (Oral)   Ht 5\' 3"  (1.6 m)   Wt 159 lb (72.1 kg)   SpO2 98%   BMI 28.17 kg/m  VS noted,  Constitutional: Pt is oriented to person, place, and time. Appears well-developed and well-nourished, in no significant distress and comfortable Head: Normocephalic and atraumatic  Eyes: Conjunctivae and EOM are normal. Pupils are equal, round, and reactive to light Right Ear: External ear normal without discharge Left Ear: External ear normal without discharge Nose: Nose without discharge or deformity Mouth/Throat: Oropharynx is without other ulcerations and moist  Neck: Normal range of motion. Neck supple. No JVD present. No tracheal deviation present or significant neck LA or mass Cardiovascular: Normal rate, regular rhythm, normal heart sounds and intact distal pulses.   Pulmonary/Chest: WOB normal and breath sounds without rales or wheezing  Abdominal: Soft. Bowel sounds are normal. NT. No HSM  Musculoskeletal: Normal range of motion. Exhibits no edema Lymphadenopathy: Has no other cervical adenopathy.  Neurological: Pt is alert and oriented to person, place, and time. Pt has normal reflexes. No cranial nerve deficit. Motor grossly intact, Gait intact Skin: Skin is warm and dry. No rash noted or new ulcerations Psychiatric:  Has depressed mild nervous mood and affect. Behavior is normal  without agitation No other exam findings  ECG interpreted personally by me today Sinus bradycardia 53    Assessment & Plan:

## 2018-01-26 NOTE — Patient Instructions (Addendum)
Your EKG was OK today   Your shingles shot was sent to the pharmacy  Please take all new medication as prescribed - the lexapro 10 mg per day  Please continue all other medications as before, and refills have been done if requested.  Please have the pharmacy call with any other refills you may need.  Please continue your efforts at being more active, low cholesterol diet, and weight control.  You are otherwise up to date with prevention measures today.  Please keep your appointments with your specialists as you may have planned  Please go to the LAB in the Basement (turn left off the elevator) for the tests to be done today  You will be contacted by phone if any changes need to be made immediately.  Otherwise, you will receive a letter about your results with an explanation, but please check with MyChart first.  Please remember to sign up for MyChart if you have not done so, as this will be important to you in the future with finding out test results, communicating by private email, and scheduling acute appointments online when needed.  Please return in 1 year for your yearly visit, or sooner if needed, with Lab testing done 3-5 days before

## 2018-01-27 ENCOUNTER — Ambulatory Visit: Payer: Commercial Managed Care - PPO | Admitting: Internal Medicine

## 2018-01-27 LAB — HIV ANTIBODY (ROUTINE TESTING W REFLEX): HIV: NONREACTIVE

## 2018-01-29 ENCOUNTER — Encounter: Payer: Self-pay | Admitting: Internal Medicine

## 2018-01-29 NOTE — Assessment & Plan Note (Addendum)
Mild to mod, no SI or HI, for lexapro 10 qd,  to f/u any worsening symptoms or concerns  In addition to the time spent performing CPE, I spent an additional 15 minutes face to face,in which greater than 50% of this time was spent in counseling and coordination of care for patient's illness as documented, including the differential dx, treatment, further evaluation and other management of depression and anxiety, HTN, and HLD

## 2018-01-29 NOTE — Assessment & Plan Note (Signed)

## 2018-01-29 NOTE — Assessment & Plan Note (Signed)
stable overall by history and exam, recent data reviewed with pt, and pt to continue medical treatment as before,  to f/u any worsening symptoms or concerns BP Readings from Last 3 Encounters:  01/26/18 106/68  01/23/18 108/82  10/05/17 110/70

## 2018-01-29 NOTE — Assessment & Plan Note (Signed)
Lab Results  Component Value Date   LDLCALC 73 01/26/2018  stable overall by history and exam, recent data reviewed with pt, and pt to continue medical treatment as before,  to f/u any worsening symptoms or concerns

## 2018-02-27 ENCOUNTER — Encounter: Payer: Self-pay | Admitting: Family

## 2018-02-27 ENCOUNTER — Ambulatory Visit (INDEPENDENT_AMBULATORY_CARE_PROVIDER_SITE_OTHER): Payer: Commercial Managed Care - PPO | Admitting: Family

## 2018-02-27 VITALS — BP 118/82 | HR 55 | Temp 98.9°F | Ht 63.0 in | Wt 158.1 lb

## 2018-02-27 DIAGNOSIS — J209 Acute bronchitis, unspecified: Secondary | ICD-10-CM | POA: Diagnosis not present

## 2018-02-27 DIAGNOSIS — J019 Acute sinusitis, unspecified: Secondary | ICD-10-CM

## 2018-02-27 DIAGNOSIS — R6889 Other general symptoms and signs: Secondary | ICD-10-CM | POA: Diagnosis not present

## 2018-02-27 LAB — POC INFLUENZA A&B (BINAX/QUICKVUE)
Influenza A, POC: NEGATIVE
Influenza B, POC: NEGATIVE

## 2018-02-27 MED ORDER — BENZONATATE 100 MG PO CAPS: 100.0000 mg | ORAL_CAPSULE | Freq: Three times a day (TID) | ORAL | 0 refills | Status: DC | PRN

## 2018-02-27 MED ORDER — FLUTICASONE PROPIONATE 50 MCG/ACT NA SUSP: 2.0000 | Freq: Every day | NASAL | 6 refills | Status: DC

## 2018-02-27 MED ORDER — CEFDINIR 300 MG PO CAPS: 300.0000 mg | ORAL_CAPSULE | Freq: Two times a day (BID) | ORAL | 0 refills | Status: DC

## 2018-02-27 NOTE — Addendum Note (Signed)
Addended by: Marcina Millard on: 02/27/2018 02:13 PM   Modules accepted: Orders

## 2018-02-27 NOTE — Progress Notes (Signed)
Annette [name] is a 62 y.o. female with the following history as recorded in [employer]:  Patient Active Problem List   Diagnosis Date Noted  . Urinary retention 01/19/2017  . Hamstring tendinitis of left thigh 01/19/2017  . Nonallopathic lesion of cervical region 12/19/2015  . Nonallopathic lesion of lumbosacral region 12/19/2015  . Nonallopathic lesion of sacral region 12/19/2015  . 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 Plantar fasciitis of right foot 01/21/2015  . Hyperglycemia 01/08/2015  . Pain in both feet 01/08/2015  . Iron deficiency anemia 11/20/2012  . Acute pharyngitis 02/18/2012  . Bacterial vaginal infection 11/12/2011  . Encounter for well adult exam with abnormal findings 11/06/2011  . ANXIETY 11/10/2010  . ATTENTION DEFICIT DISORDER 11/10/2010  . EMPHYSEMA 06/16/2010  . BUNION, LEFT FOOT 06/16/2010  . OSTEOPENIA 06/16/2010  . MENOPAUSAL DISORDER 11/07/2009  . UTI (urinary tract infection) 09/26/2009  . HOT FLASHES 09/26/2009  . POLYARTHRALGIA 11/06/2008  . LUMBAR RADICULOPATHY, LEFT 11/06/2008  . ALLERGIC RHINITIS 10/01/2007  . Insomnia, unspecified 10/01/2007  . ISCHEMIC COLITIS, HX OF 10/01/2007  . Hyperlipidemia 08/20/2007  . OBESITY, MILD 08/20/2007  . Depression 08/20/2007  . Carpal tunnel syndrome 08/20/2007  . Essential hypertension 08/20/2007  . Raynaud's syndrome 08/20/2007  . BARTHOLIN'S CYST 08/20/2007  . COLONIC POLYPS, HX OF 08/20/2007    Current Outpatient Medications  Medication Sig Dispense Refill  . aspirin EC 81 MG tablet Take 1 tablet (81 mg total) by mouth daily. 90 tablet 11  . Diclofenac Sodium (PENNSAID) 2 % SOLN Place 2 application onto the skin 2 (two) times daily. 112 g 3  . escitalopram (LEXAPRO) 10 MG tablet Take 1 tablet (10 mg total) by mouth daily. 90 tablet 3  . Estradiol (VAGIFEM) 10 MCG TABS vaginal tablet Place 1 tablet (10 mcg total) vaginally 2 (two) times a week. 24 tablet 3  . gabapentin (NEURONTIN) 100 MG  capsule Take 2 capsules (200 mg total) by mouth at bedtime. 180 capsule 3  . ibuprofen (ADVIL,MOTRIN) 200 MG tablet Take 400 mg by mouth every 6 (six) hours as needed for moderate pain.    . meloxicam (MOBIC) 15 MG tablet Take 1 tablet (15 mg total) by mouth daily. 90 tablet 3  . rosuvastatin (CRESTOR) 10 MG tablet Take 1 tablet (10 mg total) by mouth daily. 90 tablet 3  . SHINGRIX injection INJECT 0.5 MLS INTO THE MUSCLE ONCE FOR 1 DOSE.**RETURN TO PHARMACY IN 2-6 MONTHS FOR 2ND DOSE  1  . zolpidem (AMBIEN) 10 MG tablet Take 1 tablet (10 mg total) by mouth at bedtime as needed for sleep. 90 tablet 1  . benzonatate (TESSALON) 100 MG capsule Take 1 capsule (100 mg total) by mouth 3 (three) times daily as needed. 20 capsule 0  . cefdinir (OMNICEF) 300 MG capsule Take 1 capsule (300 mg total) by mouth 2 (two) times daily. 20 capsule 0  . fluticasone (FLONASE) 50 MCG/ACT nasal spray Place 2 sprays into both nostrils daily. 16 g 6   No current facility-administered medications for this visit.     Allergies: Patient has no known allergies.  Past Medical History:  Diagnosis Date  . ALLERGIC RHINITIS 10/01/2007  . ANXIETY 11/10/2010  . ATTENTION DEFICIT DISORDER 11/10/2010  . BARTHOLIN'S CYST 08/20/2007  . Carpal tunnel syndrome 08/20/2007  . COLONIC POLYPS, HX OF 08/20/2007  . DEPRESSION 08/20/2007  . EMPHYSEMA 06/16/2010  . HOT FLASHES 09/26/2009  . HYPERLIPIDEMIA 08/20/2007  . HYPERTENSION 08/20/2007  . Insomnia, unspecified 10/01/2007  .  ISCHEMIC COLITIS, HX OF 10/01/2007  . MENOPAUSAL DISORDER 11/07/2009  . OBESITY, MILD 08/20/2007  . OSTEOPENIA 06/16/2010  . POLYARTHRALGIA 11/06/2008  . Raynaud's syndrome 08/20/2007    Past Surgical History:  Procedure Laterality Date  . TONSILLECTOMY      Family History  Problem Relation Age of Onset  . Osteoporosis Mother   . ADD / ADHD Daughter   . Osteoporosis Maternal Grandmother   . Breast cancer Maternal Grandmother   . Coronary artery disease  Other   . Hypertension Other   . Prostate cancer Other   . Colon cancer Neg Hx     Social History   Tobacco Use  . Smoking status: Former Smoker    Last attempt to quit: 12/27/1990    Years since quitting: 27.1  . Smokeless tobacco: Never Used  Substance Use Topics  . Alcohol use: Yes    Comment: one drink per day    Subjective:  Started with flu-like symptoms last week- progressed into sinus pain/ pressure and sense of pressure in chest; "just can't seem to shake it." + chills; using OTC Theraflu with mild benefit; not prone to recurrent sinus infections or bronchitis;   Objective:  Vitals:   02/27/18 1339  BP: 118/82  Pulse: (!) 55  Temp: 98.9 F (37.2 C)  TempSrc: Oral  SpO2: 98%  Weight: 158 lb 1.3 oz (71.7 kg)  Height: 5\' 3"  (1.6 m)    General: Well developed, well nourished, in no acute distress  Skin : Warm and dry.  Head: Normocephalic and atraumatic  Eyes: Sclera and conjunctiva clear; pupils round and reactive to light; extraocular movements intact  Ears: External normal; canals clear; tympanic membranes normal  Oropharynx: Pink, supple. No suspicious lesions; post-nasal drainage seen  Neck: Supple without thyromegaly, adenopathy  Lungs: Respirations unlabored; clear to auscultation bilaterally without wheeze, rales, rhonchi  CVS exam: normal rate and regular rhythm.  Neurologic: Alert and oriented; speech intact; face symmetrical; moves all extremities well; CNII-XII intact without focal deficit  Assessment:  1. Acute sinusitis, recurrence not specified, unspecified location   2. Acute bronchitis, unspecified organism     Plan:  Rx for Omnicef 300 mg bid x 10 days; Rx for Flonase 2 sprays per nostril bid; Rx for Tessalon Perles tid prn; increase fluids, rest and follow up worse, no better.   No Follow-up on file.  No orders of the defined types were placed in this encounter.   Requested Prescriptions   Signed Prescriptions Disp Refills  . cefdinir  (OMNICEF) 300 MG capsule 20 capsule 0    Sig: Take 1 capsule (300 mg total) by mouth 2 (two) times daily.  . fluticasone (FLONASE) 50 MCG/ACT nasal spray 16 g 6    Sig: Place 2 sprays into both nostrils daily.  . benzonatate (TESSALON) 100 MG capsule 20 capsule 0    Sig: Take 1 capsule (100 mg total) by mouth 3 (three) times daily as needed.

## 2018-03-13 ENCOUNTER — Encounter: Payer: Self-pay | Admitting: *Deleted

## 2018-03-20 ENCOUNTER — Ambulatory Visit: Payer: Commercial Managed Care - PPO | Admitting: Family Medicine

## 2018-04-03 NOTE — Progress Notes (Signed)
[name] [address], [place] [address] Phone: [phone] Subjective:    I'm seeing this patient by the request  of:    CC: Back pain follow-up  [id number]  Annette [name] is a 62 y.o. female coming in with complaint of back pain. She does have continued pain. She said tha pain is manageable but is still constant and dull. She continues to have left knee pain as well. She does do stretching and rehab exercises.  Patient does have some back pain noted.  Discussed with patient about icing regimen and home exercises.  Discussed which activities to doing which wants to avoid and she has been doing that.  Has noticed though some increasing stiffness and tightness recently.     Past Medical History:  Diagnosis Date  . ALLERGIC RHINITIS 10/01/2007  . ANXIETY 11/10/2010  . ATTENTION DEFICIT DISORDER 11/10/2010  . BARTHOLIN'S CYST 08/20/2007  . Carpal tunnel syndrome 08/20/2007  . COLONIC POLYPS, HX OF 08/20/2007  . DEPRESSION 08/20/2007  . EMPHYSEMA 06/16/2010  . HOT FLASHES 09/26/2009  . HYPERLIPIDEMIA 08/20/2007  . HYPERTENSION 08/20/2007  . Insomnia, unspecified 10/01/2007  . ISCHEMIC COLITIS, HX OF 10/01/2007  . MENOPAUSAL DISORDER 11/07/2009  . OBESITY, MILD 08/20/2007  . OSTEOPENIA 06/16/2010  . POLYARTHRALGIA 11/06/2008  . Raynaud's syndrome 08/20/2007   Past Surgical History:  Procedure Laterality Date  . TONSILLECTOMY     Social History   Socioeconomic History  . Marital status: Married    Spouse name: Not on file  . Number of children: 1  . Years of education: Not on file  . Highest education level: Not on file  Occupational History    Employer: [employer]  Social Needs  . Financial resource strain: Not on file  . Food insecurity:    Worry: Not on file    Inability: Not on file  . Transportation needs:    Medical: Not on file    Non-medical: Not on file  Tobacco Use  . Smoking status: Former Smoker    Last  attempt to quit: 12/27/1990    Years since quitting: 27.2  . Smokeless tobacco: Never Used  Substance and Sexual Activity  . Alcohol use: Yes    Comment: one drink per day  . Drug use: No  . Sexual activity: Not on file  Lifestyle  . Physical activity:    Days per week: Not on file    Minutes per session: Not on file  . Stress: Not on file  Relationships  . Social connections:    Talks on phone: Not on file    Gets together: Not on file    Attends religious service: Not on file    Active member of club or organization: Not on file    Attends meetings of clubs or organizations: Not on file    Relationship status: Not on file  Other Topics Concern  . Not on file  Social History Narrative  . Not on file   No Known Allergies Family History  Problem Relation Age of Onset  . Osteoporosis Mother   . ADD / ADHD Daughter   . Osteoporosis Maternal Grandmother   . Breast cancer Maternal Grandmother   . Coronary artery disease Other   . Hypertension Other   . Prostate cancer Other   . Colon cancer Neg Hx      Past medical history, social, surgical and family history all reviewed in electronic medical record.  No  pertanent information unless stated regarding to the chief complaint.   Review of Systems:Review of systems updated and as accurate as of 04/04/18  No headache, visual changes, nausea, vomiting, diarrhea, constipation, dizziness, abdominal pain, skin rash, fevers, chills, night sweats, weight loss, swollen lymph nodes, body aches, joint swelling, chest pain, shortness of breath, mood changes.  Positive muscle aches  Objective  Blood pressure 112/78, pulse 60, height 5\' 3"  (1.6 m), weight 162 lb (73.5 kg), SpO2 98 %. Systems examined below as of 04/04/18   General: No apparent distress alert and oriented x3 mood and affect normal, dressed appropriately.  HEENT: Pupils equal, extraocular movements intact  Respiratory: Patient's speak in full sentences and does not appear  short of breath  Cardiovascular: No lower extremity edema, non tender, no erythema  Skin: Warm dry intact with no signs of infection or rash on extremities or on axial skeleton.  Abdomen: Soft nontender  Neuro: Cranial nerves II through XII are intact, neurovascularly intact in all extremities with 2+ DTRs and 2+ pulses.  Lymph: No lymphadenopathy of posterior or anterior cervical chain or axillae bilaterally.  Gait normal with good balance and coordination.  MSK:  Non tender with full range of motion and good stability and symmetric strength and tone of shoulders, elbows, wrist, hip, knee and ankles bilaterally.  Back Exam:  Inspection: Mild loss of lordosis Motion: Flexion 45 deg, Extension 35 deg, Side Bending to 35 deg bilaterally,  Rotation to 45 deg bilaterally  SLR laying: Negative  XSLR laying: Negative  Palpable tenderness: Tender to palpation the paraspinal musculature lumbar spine right greater than left mostly over the sacroiliac joint. FABER: Positive right. Sensory change: Gross sensation intact to all lumbar and sacral dermatomes.  Reflexes: 2+ at both patellar tendons, 2+ at achilles tendons, Babinski's downgoing.  Strength at foot  Plantar-flexion: 5/5 Dorsi-flexion: 5/5 Eversion: 5/5 Inversion: 5/5  Leg strength  Quad: 5/5 Hamstring: 5/5 Hip flexor: 5/5 Hip abductors: 4/5    Osteopathic findings C2 flexed rotated and side bent right C4 flexed rotated and side bent left T3 extended rotated and side bent righ T6 extended rotated and side bent left L2 flexed rotated and side bent right Sacrum right on right    Impression and Recommendations:     This case required medical decision making of moderate complexity.      Note: This dictation was prepared with Dragon dictation along with smaller phrase technology. Any transcriptional errors that result from this process are unintentional.

## 2018-04-04 ENCOUNTER — Other Ambulatory Visit: Payer: Self-pay

## 2018-04-04 ENCOUNTER — Ambulatory Visit (INDEPENDENT_AMBULATORY_CARE_PROVIDER_SITE_OTHER): Payer: Commercial Managed Care - PPO | Admitting: Family Medicine

## 2018-04-04 ENCOUNTER — Encounter: Payer: Self-pay | Admitting: Family Medicine

## 2018-04-04 VITALS — BP 112/78 | HR 60 | Ht 63.0 in | Wt 162.0 lb

## 2018-04-04 DIAGNOSIS — M533 Sacrococcygeal disorders, not elsewhere classified: Secondary | ICD-10-CM

## 2018-04-04 DIAGNOSIS — M999 Biomechanical lesion, unspecified: Secondary | ICD-10-CM | POA: Diagnosis not present

## 2018-04-04 MED ORDER — DICLOFENAC SODIUM 2 % TD SOLN: 2.0000 "application " | Freq: Two times a day (BID) | TRANSDERMAL | 3 refills | Status: DC

## 2018-04-04 NOTE — Assessment & Plan Note (Signed)
Decision today to treat with OMT was based on Physical Exam  After verbal consent patient was treated with HVLA, ME, FPR techniques in cervical, thoracic, lumbar and sacral areas  Patient tolerated the procedure well with improvement in symptoms  Patient given exercises, stretches and lifestyle modifications  See medications in patient instructions if given  Patient will follow up in 4-6 weeks 

## 2018-04-04 NOTE — Assessment & Plan Note (Signed)
Patient does have more of a sacroiliac dysfunction.  Responded well to manipulation.  Patient is having more exacerbation and will have patient follow-up sooner in about 4 weeks.  Continue all medications.

## 2018-04-04 NOTE — Patient Instructions (Signed)
Good to see you  Sorry you are hurting a little I think we got great movement Bring running shoes next time  Shorten your stride length  Stay active You will do great  See me again in 4-6 weeks

## 2018-04-10 ENCOUNTER — Telehealth: Payer: Self-pay | Admitting: Internal Medicine

## 2018-05-09 NOTE — Progress Notes (Signed)
[name] [address], [place] [address] Phone: [phone] Subjective:    I'm seeing this patient by the request  of:    CC: Knee pain  [id number]  Annette [name] is a 62 y.o. female coming in with complaint of knee pain. She has been doing well. The left knee has improved. The right knee is bothering her on the patella. Her left side of her back continues to bother with pain from her back into the left hip.   Patient has been seen for back pain previously and has responded well to manipulation.  States that there is some mild discomfort and pain but nothing severe.     Past Medical History:  Diagnosis Date  . ALLERGIC RHINITIS 10/01/2007  . ANXIETY 11/10/2010  . ATTENTION DEFICIT DISORDER 11/10/2010  . BARTHOLIN'S CYST 08/20/2007  . Carpal tunnel syndrome 08/20/2007  . COLONIC POLYPS, HX OF 08/20/2007  . DEPRESSION 08/20/2007  . EMPHYSEMA 06/16/2010  . HOT FLASHES 09/26/2009  . HYPERLIPIDEMIA 08/20/2007  . HYPERTENSION 08/20/2007  . Insomnia, unspecified 10/01/2007  . ISCHEMIC COLITIS, HX OF 10/01/2007  . MENOPAUSAL DISORDER 11/07/2009  . OBESITY, MILD 08/20/2007  . OSTEOPENIA 06/16/2010  . POLYARTHRALGIA 11/06/2008  . Raynaud's syndrome 08/20/2007   Past Surgical History:  Procedure Laterality Date  . TONSILLECTOMY     Social History   Socioeconomic History  . Marital status: Married    Spouse name: Not on file  . Number of children: 1  . Years of education: Not on file  . Highest education level: Not on file  Occupational History    Employer: [employer]  Social Needs  . Financial resource strain: Not on file  . Food insecurity:    Worry: Not on file    Inability: Not on file  . Transportation needs:    Medical: Not on file    Non-medical: Not on file  Tobacco Use  . Smoking status: Former Smoker    Last attempt to quit: 12/27/1990    Years since quitting: 27.3  . Smokeless tobacco: Never Used  Substance  and Sexual Activity  . Alcohol use: Yes    Comment: one drink per day  . Drug use: No  . Sexual activity: Not on file  Lifestyle  . Physical activity:    Days per week: Not on file    Minutes per session: Not on file  . Stress: Not on file  Relationships  . Social connections:    Talks on phone: Not on file    Gets together: Not on file    Attends religious service: Not on file    Active member of club or organization: Not on file    Attends meetings of clubs or organizations: Not on file    Relationship status: Not on file  Other Topics Concern  . Not on file  Social History Narrative  . Not on file   No Known Allergies Family History  Problem Relation Age of Onset  . Osteoporosis Mother   . ADD / ADHD Daughter   . Osteoporosis Maternal Grandmother   . Breast cancer Maternal Grandmother   . Coronary artery disease Other   . Hypertension Other   . Prostate cancer Other   . Colon cancer Neg Hx      Past medical history, social, surgical and family history all reviewed in electronic medical record.  No pertanent information unless stated regarding to the chief complaint.   Review  of Systems:Review of systems updated and as accurate as of 05/10/18  No headache, visual changes, nausea, vomiting, diarrhea, constipation, dizziness, abdominal pain, skin rash, fevers, chills, night sweats, weight loss, swollen lymph nodes, body aches, joint swelling, muscle aches, chest pain, shortness of breath, mood changes.   Objective  Blood pressure 100/72, pulse (!) 54, height 5\' 3"  (1.6 m), weight 153 lb (69.4 kg), SpO2 98 %. Systems examined below as of 05/10/18   General: No apparent distress alert and oriented x3 mood and affect normal, dressed appropriately.  HEENT: Pupils equal, extraocular movements intact  Respiratory: Patient's speak in full sentences and does not appear short of breath  Cardiovascular: No lower extremity edema, non tender, no erythema  Skin: Warm dry intact  with no signs of infection or rash on extremities or on axial skeleton.  Abdomen: Soft nontender  Neuro: Cranial nerves II through XII are intact, neurovascularly intact in all extremities with 2+ DTRs and 2+ pulses.  Lymph: No lymphadenopathy of posterior or anterior cervical chain or axillae bilaterally.  Gait normal with good balance and coordination.  Analysis shows the patient's right knee does seem to cross midline.  Mild weakness noted of the hip abductors. MSK:  Non tender with full range of motion and good stability and symmetric strength and tone of shoulders, elbows, wrist, hip, knee and ankles bilaterally.   Back exam shows some mild loss of lordosis.  Does have good core strength though.  Mild tightness on the right side of the [place].  Negative straight leg test.  Osteopathic findings C2 flexed rotated and side bent right T9 extended rotated and side bent left L3 flexed rotated and side bent right Sacrum right on right    Impression and Recommendations:     This case required medical decision making of moderate complexity.      Note: This dictation was prepared with Dragon dictation along with smaller phrase technology. Any transcriptional errors that result from this process are unintentional.

## 2018-05-10 ENCOUNTER — Encounter: Payer: Self-pay | Admitting: Family Medicine

## 2018-05-10 ENCOUNTER — Ambulatory Visit: Payer: Commercial Managed Care - PPO | Admitting: Family Medicine

## 2018-05-10 ENCOUNTER — Ambulatory Visit (INDEPENDENT_AMBULATORY_CARE_PROVIDER_SITE_OTHER): Payer: Commercial Managed Care - PPO | Admitting: Family Medicine

## 2018-05-10 VITALS — BP 100/72 | HR 54 | Ht 63.0 in | Wt 153.0 lb

## 2018-05-10 DIAGNOSIS — M533 Sacrococcygeal disorders, not elsewhere classified: Secondary | ICD-10-CM | POA: Diagnosis not present

## 2018-05-10 DIAGNOSIS — M76892 Other specified enthesopathies of left lower limb, excluding foot: Secondary | ICD-10-CM

## 2018-05-10 DIAGNOSIS — M999 Biomechanical lesion, unspecified: Secondary | ICD-10-CM

## 2018-05-10 NOTE — Assessment & Plan Note (Signed)
Improved

## 2018-05-10 NOTE — Patient Instructions (Signed)
Good to see you  Ice is your friend Stay active New exercises for the hip Patella strap for the knee with running. Can find that online easy See em again in 6-8 weeks

## 2018-05-10 NOTE — Assessment & Plan Note (Signed)
Stable overall.  No significant changes.  Patient is working on a more regular basis and is making progress.  Notably the patient will do well with conservative therapy at this point and patient can follow-up in 2 months for further evaluation and treatment.

## 2018-05-10 NOTE — Assessment & Plan Note (Signed)
Decision today to treat with OMT was based on Physical Exam  After verbal consent patient was treated with HVLA, ME, FPR techniques in cervical, thoracic, lumbar and sacral areas  Patient tolerated the procedure well with improvement in symptoms  Patient given exercises, stretches and lifestyle modifications  See medications in patient instructions if given  Patient will follow up in 8 weeks 

## 2018-06-22 ENCOUNTER — Ambulatory Visit: Payer: Commercial Managed Care - PPO | Admitting: Family Medicine

## 2018-07-18 NOTE — Progress Notes (Signed)
[name] [address] [place], [place] [address] Phone: [phone] Subjective:    CC: Low back pain follow-up.  [id number]  Annette [name] is a 62 y.o. female coming in with complaint of back pain. States that the pain is manageable today.  Mild discomfort overall.  Patient is lifting weight and is even running but has slowed down the pace.  States that she has been doing them doing relatively well.  Denies any numbness or tingling.  Patient states just some mild discomfort from time to time.  Able to do all activities without any significant discomfort or pain.     Past Medical History:  Diagnosis Date  . ALLERGIC RHINITIS 10/01/2007  . ANXIETY 11/10/2010  . ATTENTION DEFICIT DISORDER 11/10/2010  . BARTHOLIN'S CYST 08/20/2007  . Carpal tunnel syndrome 08/20/2007  . COLONIC POLYPS, HX OF 08/20/2007  . DEPRESSION 08/20/2007  . EMPHYSEMA 06/16/2010  . HOT FLASHES 09/26/2009  . HYPERLIPIDEMIA 08/20/2007  . HYPERTENSION 08/20/2007  . Insomnia, unspecified 10/01/2007  . ISCHEMIC COLITIS, HX OF 10/01/2007  . MENOPAUSAL DISORDER 11/07/2009  . OBESITY, MILD 08/20/2007  . OSTEOPENIA 06/16/2010  . POLYARTHRALGIA 11/06/2008  . Raynaud's syndrome 08/20/2007   Past Surgical History:  Procedure Laterality Date  . TONSILLECTOMY     Social History   Socioeconomic History  . Marital status: Married    Spouse name: Not on file  . Number of children: 1  . Years of education: Not on file  . Highest education level: Not on file  Occupational History    Employer: [employer]  Social Needs  . Financial resource strain: Not on file  . Food insecurity:    Worry: Not on file    Inability: Not on file  . Transportation needs:    Medical: Not on file    Non-medical: Not on file  Tobacco Use  . Smoking status: Former Smoker    Last attempt to quit: 12/27/1990    Years since quitting: 27.5  . Smokeless tobacco: Never Used  Substance and Sexual  Activity  . Alcohol use: Yes    Comment: one drink per day  . Drug use: No  . Sexual activity: Not on file  Lifestyle  . Physical activity:    Days per week: Not on file    Minutes per session: Not on file  . Stress: Not on file  Relationships  . Social connections:    Talks on phone: Not on file    Gets together: Not on file    Attends religious service: Not on file    Active member of club or organization: Not on file    Attends meetings of clubs or organizations: Not on file    Relationship status: Not on file  Other Topics Concern  . Not on file  Social History Narrative  . Not on file   No Known Allergies Family History  Problem Relation Age of Onset  . Osteoporosis Mother   . ADD / ADHD Daughter   . Osteoporosis Maternal Grandmother   . Breast cancer Maternal Grandmother   . Coronary artery disease Other   . Hypertension Other   . Prostate cancer Other   . Colon cancer Neg Hx      Past medical history, social, surgical and family history all reviewed in electronic medical record.  No pertanent information unless stated regarding to the chief complaint.   Review of Systems:Review of systems updated and as accurate as of  07/19/18  No headache, visual changes, nausea, vomiting, diarrhea, constipation, dizziness, abdominal pain, skin rash, fevers, chills, night sweats,  chest pain, shortness of breath, mood changes.  Positive muscle aches  Objective  Blood pressure 100/70, pulse (!) 51, height 5\' 3"  (1.6 m), weight 159 lb (72.1 kg), SpO2 98 %. Systems examined below as of 07/19/18   General: No apparent distress alert and oriented x3 mood and affect normal, dressed appropriately.  HEENT: Pupils equal, extraocular movements intact  Respiratory: Patient's speak in full sentences and does not appear short of breath  Cardiovascular: No lower extremity edema, non tender, no erythema  Skin: Warm dry intact with no signs of infection or rash on extremities or on axial  skeleton.  Abdomen: Soft nontender  Neuro: Cranial nerves II through XII are intact, neurovascularly intact in all extremities with 2+ DTRs and 2+ pulses.  Lymph: No lymphadenopathy of posterior or anterior cervical chain or axillae bilaterally.  Gait normal with good balance and coordination.  MSK:  Non tender with full range of motion and good stability and symmetric strength and tone of shoulders, elbows, wrist, hip, knee and ankles bilaterally.  Neck: Inspection unremarkable. No palpable stepoffs. Negative Spurling's maneuver. Full neck range of motion Grip strength and sensation normal in bilateral hands Strength good C4 to T1 distribution No sensory change to C4 to T1 Negative Hoffman sign bilaterally Reflexes normal  Neck exam shows near full range of motion lacking last 5 degrees of extension.  Patient is having difficulty with Corky Sox test on the right.  Mild discomfort and pain in palpation over the right sacroiliac joint  Osteopathic findings  C3 flexed rotated and side bent right T3 extended rotated and side bent right inhaled third rib L3 flexed rotated and side bent right Sacrum right on right    Impression and Recommendations:     This case required medical decision making of moderate complexity.      Note: This dictation was prepared with Dragon dictation along with smaller phrase technology. Any transcriptional errors that result from this process are unintentional.

## 2018-07-19 ENCOUNTER — Ambulatory Visit (INDEPENDENT_AMBULATORY_CARE_PROVIDER_SITE_OTHER): Payer: Commercial Managed Care - PPO | Admitting: Family Medicine

## 2018-07-19 ENCOUNTER — Encounter: Payer: Self-pay | Admitting: Family Medicine

## 2018-07-19 VITALS — BP 100/70 | HR 51 | Ht 63.0 in | Wt 159.0 lb

## 2018-07-19 DIAGNOSIS — M533 Sacrococcygeal disorders, not elsewhere classified: Secondary | ICD-10-CM | POA: Diagnosis not present

## 2018-07-19 DIAGNOSIS — M999 Biomechanical lesion, unspecified: Secondary | ICD-10-CM | POA: Diagnosis not present

## 2018-07-19 NOTE — Assessment & Plan Note (Signed)
Patient does have some tightness.  This patient continues to do very well though.  Has been 8 weeks before last manipulation.  Doing well with no radicular symptoms.  Follow-up again in 2 months

## 2018-07-19 NOTE — Assessment & Plan Note (Signed)
Decision today to treat with OMT was based on Physical Exam  After verbal consent patient was treated with HVLA, ME, FPR techniques in cervical, thoracic, lumbar and sacral areas  Patient tolerated the procedure well with improvement in symptoms  Patient given exercises, stretches and lifestyle modifications  See medications in patient instructions if given  Patient will follow up in 8 weeks 

## 2018-07-19 NOTE — Patient Instructions (Signed)
Good to see you Keep it up  I am proud of you  Watch the hip  See me again in 8-9 weeks

## 2018-09-26 NOTE — Progress Notes (Signed)
[name] [place], [place] [address] Phone: [phone] Subjective:   [name], am serving as a scribe for Dr. Hulan Saas.   CC: Neck pain and back pain follow-up  [id number]  Annette [name] is a 62 y.o. female coming in with complaint of back pain. She feels some tightness on the left side from lumbar to cervical spine. Was on a cruise recently and feels that it is time for an adjustment.  Patient has been running some.  Continues to workout.     Past Medical History:  Diagnosis Date  . ALLERGIC RHINITIS 10/01/2007  . ANXIETY 11/10/2010  . ATTENTION DEFICIT DISORDER 11/10/2010  . BARTHOLIN'S CYST 08/20/2007  . Carpal tunnel syndrome 08/20/2007  . COLONIC POLYPS, HX OF 08/20/2007  . DEPRESSION 08/20/2007  . EMPHYSEMA 06/16/2010  . HOT FLASHES 09/26/2009  . HYPERLIPIDEMIA 08/20/2007  . HYPERTENSION 08/20/2007  . Insomnia, unspecified 10/01/2007  . ISCHEMIC COLITIS, HX OF 10/01/2007  . MENOPAUSAL DISORDER 11/07/2009  . OBESITY, MILD 08/20/2007  . OSTEOPENIA 06/16/2010  . POLYARTHRALGIA 11/06/2008  . Raynaud's syndrome 08/20/2007   Past Surgical History:  Procedure Laterality Date  . TONSILLECTOMY     Social History   Socioeconomic History  . Marital status: Married    Spouse name: Not on file  . Number of children: 1  . Years of education: Not on file  . Highest education level: Not on file  Occupational History    Employer: [employer]  Social Needs  . Financial resource strain: Not on file  . Food insecurity:    Worry: Not on file    Inability: Not on file  . Transportation needs:    Medical: Not on file    Non-medical: Not on file  Tobacco Use  . Smoking status: Former Smoker    Last attempt to quit: 12/27/1990    Years since quitting: 27.7  . Smokeless tobacco: Never Used  Substance and Sexual Activity  . Alcohol use: Yes    Comment: one drink per day  . Drug use: No  . Sexual activity: Not  on file  Lifestyle  . Physical activity:    Days per week: Not on file    Minutes per session: Not on file  . Stress: Not on file  Relationships  . Social connections:    Talks on phone: Not on file    Gets together: Not on file    Attends religious service: Not on file    Active member of club or organization: Not on file    Attends meetings of clubs or organizations: Not on file    Relationship status: Not on file  Other Topics Concern  . Not on file  Social History Narrative  . Not on file   No Known Allergies Family History  Problem Relation Age of Onset  . Osteoporosis Mother   . ADD / ADHD Daughter   . Osteoporosis Maternal Grandmother   . Breast cancer Maternal Grandmother   . Coronary artery disease Other   . Hypertension Other   . Prostate cancer Other   . Colon cancer Neg Hx      Current Outpatient Medications (Cardiovascular):  .  rosuvastatin (CRESTOR) 10 MG tablet, Take 1 tablet (10 mg total) by mouth daily.  Current Outpatient Medications (Respiratory):  .  fluticasone (FLONASE) 50 MCG/ACT nasal spray, Place 2 sprays into both nostrils daily.  Current Outpatient Medications (Analgesics):  .  aspirin EC  81 MG tablet, Take 1 tablet (81 mg total) by mouth daily. Marland Kitchen  ibuprofen (ADVIL,MOTRIN) 200 MG tablet, Take 400 mg by mouth every 6 (six) hours as needed for moderate pain. .  meloxicam (MOBIC) 15 MG tablet, Take 1 tablet (15 mg total) by mouth daily.   Current Outpatient Medications (Other):  Marland Kitchen  Diclofenac Sodium (PENNSAID) 2 % SOLN, Place 2 application onto the skin 2 (two) times daily. .  Estradiol (VAGIFEM) 10 MCG TABS vaginal tablet, Place 1 tablet (10 mcg total) vaginally 2 (two) times a week. .  gabapentin (NEURONTIN) 100 MG capsule, Take 2 capsules (200 mg total) by mouth at bedtime. Marland Kitchen  SHINGRIX injection, INJECT 0.5 MLS INTO THE MUSCLE ONCE FOR 1 DOSE.**RETURN TO PHARMACY IN 2-6 MONTHS FOR 2ND DOSE .  zolpidem (AMBIEN) 10 MG tablet, Take 1 tablet  (10 mg total) by mouth at bedtime as needed for sleep. Marland Kitchen  escitalopram (LEXAPRO) 10 MG tablet, Take 1 tablet (10 mg total) by mouth daily.    Past medical history, social, surgical and family history all reviewed in electronic medical record.  No pertanent information unless stated regarding to the chief complaint.   Review of Systems:  No headache, visual changes, nausea, vomiting, diarrhea, constipation, dizziness, abdominal pain, skin rash, fevers, chills, night sweats, weight loss, swollen lymph nodes, body aches, joint swelling, muscle aches, chest pain, shortness of breath, mood changes.   Objective  Blood pressure 110/72, pulse (!) 54, height 5\' 3"  (1.6 m), weight 159 lb (72.1 kg), SpO2 98 %.    General: No apparent distress alert and oriented x3 mood and affect normal, dressed appropriately.  HEENT: Pupils equal, extraocular movements intact  Respiratory: Patient's speak in full sentences and does not appear short of breath  Cardiovascular: No lower extremity edema, non tender, no erythema  Skin: Warm dry intact with no signs of infection or rash on extremities or on axial skeleton.  Abdomen: Soft nontender  Neuro: Cranial nerves II through XII are intact, neurovascularly intact in all extremities with 2+ DTRs and 2+ pulses.  Lymph: No lymphadenopathy of posterior or anterior cervical chain or axillae bilaterally.  Gait normal with good balance and coordination.  MSK:  Non tender with full range of motion and good stability and symmetric strength and tone of shoulders, elbows, wrist, hip, knee and ankles bilaterally. Neck: Inspection unremarkable. No palpable stepoffs. Negative Spurling's maneuver. Loss of lordosis and does have tightness of the trapezius bilaterally Grip strength and sensation normal in bilateral hands Strength good C4 to T1 distribution No sensory change to C4 to T1 Negative Hoffman sign bilaterally Reflexes normal  Back Exam:  Inspection: Unremarkable    Motion: Flexion 40 deg, Extension 25 deg, Side Bending to 45 deg bilaterally,  Rotation to 45 deg bilaterally  SLR laying: Negative  XSLR laying: Negative  Palpable tenderness: Tender to palpation the paraspinal musculature lumbar spine right greater than left. FABER: + faber. Sensory change: Gross sensation intact to all lumbar and sacral dermatomes.  Reflexes: 2+ at both patellar tendons, 2+ at achilles tendons, Babinski's downgoing.  Strength at foot  Plantar-flexion: 5/5 Dorsi-flexion: 5/5 Eversion: 5/5 Inversion: 5/5  Leg strength  Quad: 5/5 Hamstring: 5/5 Hip flexor: 5/5 Hip abductors: 4/5   Osteopathic findings C2 flexed rotated and side bent right T3 extended rotated and side bent right inhaled third rib T9 extended rotated and side bent left L3 flexed rotated and side bent right Sacrum right on right    Impression and Recommendations:  This case required medical decision making of moderate complexity. The above documentation has been reviewed and is accurate and complete Lyndal Pulley, DO       Note: This dictation was prepared with Dragon dictation along with smaller phrase technology. Any transcriptional errors that result from this process are unintentional.

## 2018-09-27 ENCOUNTER — Encounter: Payer: Self-pay | Admitting: Family Medicine

## 2018-09-27 ENCOUNTER — Ambulatory Visit (INDEPENDENT_AMBULATORY_CARE_PROVIDER_SITE_OTHER): Payer: PRIVATE HEALTH INSURANCE | Admitting: Family Medicine

## 2018-09-27 VITALS — BP 110/72 | HR 54 | Ht 63.0 in | Wt 159.0 lb

## 2018-09-27 DIAGNOSIS — M533 Sacrococcygeal disorders, not elsewhere classified: Secondary | ICD-10-CM | POA: Diagnosis not present

## 2018-09-27 DIAGNOSIS — M999 Biomechanical lesion, unspecified: Secondary | ICD-10-CM

## 2018-09-27 NOTE — Patient Instructions (Signed)
Good to see you  Annette [name] is your friend Stay active  You are doing amazing overall  If you need [place] call [phone] See me again in 2 months or if really well then 3 months

## 2018-09-27 NOTE — Assessment & Plan Note (Signed)
Discussed posture and ergonomics.  Discussed which activities to do which wants to avoid.  Discussed some exercises and icing regimen.  Patient is doing well.  Follow-up again in 8 weeks

## 2018-09-27 NOTE — Assessment & Plan Note (Signed)
Decision today to treat with OMT was based on Physical Exam  After verbal consent patient was treated with HVLA, ME, FPR techniques in cervical, thoracic, rib,  lumbar and sacral areas  Patient tolerated the procedure well with improvement in symptoms  Patient given exercises, stretches and lifestyle modifications  See medications in patient instructions if given  Patient will follow up in 4-8 weeks 

## 2018-11-02 ENCOUNTER — Encounter: Payer: Self-pay | Admitting: Family

## 2018-11-02 ENCOUNTER — Ambulatory Visit (INDEPENDENT_AMBULATORY_CARE_PROVIDER_SITE_OTHER): Payer: Commercial Managed Care - PPO | Admitting: Family

## 2018-11-02 ENCOUNTER — Ambulatory Visit: Payer: Self-pay

## 2018-11-02 ENCOUNTER — Other Ambulatory Visit: Payer: Self-pay | Admitting: Internal Medicine

## 2018-11-02 VITALS — BP 108/80 | HR 77 | Temp 98.6°F | Ht 63.0 in | Wt 154.1 lb

## 2018-11-02 DIAGNOSIS — J209 Acute bronchitis, unspecified: Secondary | ICD-10-CM | POA: Diagnosis not present

## 2018-11-02 DIAGNOSIS — J019 Acute sinusitis, unspecified: Secondary | ICD-10-CM

## 2018-11-02 MED ORDER — CEFDINIR 300 MG PO CAPS: 300.0000 mg | ORAL_CAPSULE | Freq: Two times a day (BID) | ORAL | 0 refills | Status: DC

## 2018-11-02 NOTE — Telephone Encounter (Signed)
Pt c/o sinus congestion, chest "ache with deep breaths", sore throat, runny nose, headache, right ear "popping." Symptoms began Monday and pt stated that she has gotten progressively worse. Pt denies shortness of breath. Pt denies fever, wheezing. Pt with productive cough with green phlegm. Pt stated she had chills yesterday and thinks she is running a low grade fever today.  Care advice given and pt verbalized understanding. No available appointments with PCP. Pt given appointment today at 10:20 with Jodi Mourning FNP.     Reason for Disposition . Earache  Answer Assessment - Initial Assessment Questions 1. ONSET: "When did the cough begin?"      Monday 2. SEVERITY: "How bad is the cough today?"      "manageable" 3. RESPIRATORY DISTRESS: "Describe your breathing."      Chest aches when taking a deep breath in 4. FEVER: "Do you have a fever?" If so, ask: "What is your temperature, how was it measured, and when did it start?"     No 5. SPUTUM: "Describe the color of your sputum" (clear, white, yellow, green)     green 6. HEMOPTYSIS: "Are you coughing up any blood?" If so ask: "How much?" (flecks, streaks, tablespoons, etc.)     no 7. CARDIAC HISTORY: "Do you have any history of heart disease?" (e.g., heart attack, congestive heart failure)      no 8. LUNG HISTORY: "Do you have any history of lung disease?"  (e.g., pulmonary embolus, asthma, emphysema)     no 9. PE RISK FACTORS: "Do you have a history of blood clots?" (or: recent major surgery, recent prolonged travel, bedridden)     no 10. OTHER SYMPTOMS: "Do you have any other symptoms?" (e.g., runny nose, wheezing, chest pain)       Runny nose, chest pain,headache, right ear "popping" 11. PREGNANCY: "Is there any chance you are pregnant?" "When was your last menstrual period?"       n/a 12. TRAVEL: "Have you traveled out of the country in the last month?" (e.g., travel history, exposures)       no  Protocols used: [place]

## 2018-11-02 NOTE — Patient Instructions (Signed)
Please use your Flonase daily and use the Nyquil to help you sleep at night;

## 2018-11-02 NOTE — Progress Notes (Signed)
Annette [name] is a 62 y.o. female with the following history as recorded in [employer]:  Patient Active Problem List   Diagnosis Date Noted  . Nonallopathic lesion of rib cage 09/27/2018  . Nonallopathic lesion of thoracic region 09/27/2018  . Urinary retention 01/19/2017  . Hamstring tendinitis of left thigh 01/19/2017  . Nonallopathic lesion of cervical region 12/19/2015  . Nonallopathic lesion of lumbosacral region 12/19/2015  . Nonallopathic lesion of sacral region 12/19/2015  . 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 Plantar fasciitis of right foot 01/21/2015  . Hyperglycemia 01/08/2015  . Pain in both feet 01/08/2015  . Iron deficiency anemia 11/20/2012  . Acute pharyngitis 02/18/2012  . Bacterial vaginal infection 11/12/2011  . Encounter for well adult exam with abnormal findings 11/06/2011  . ANXIETY 11/10/2010  . ATTENTION DEFICIT DISORDER 11/10/2010  . EMPHYSEMA 06/16/2010  . BUNION, LEFT FOOT 06/16/2010  . OSTEOPENIA 06/16/2010  . MENOPAUSAL DISORDER 11/07/2009  . UTI (urinary tract infection) 09/26/2009  . HOT FLASHES 09/26/2009  . POLYARTHRALGIA 11/06/2008  . LUMBAR RADICULOPATHY, LEFT 11/06/2008  . ALLERGIC RHINITIS 10/01/2007  . Insomnia, unspecified 10/01/2007  . ISCHEMIC COLITIS, HX OF 10/01/2007  . Hyperlipidemia 08/20/2007  . OBESITY, MILD 08/20/2007  . Depression 08/20/2007  . Carpal tunnel syndrome 08/20/2007  . Essential hypertension 08/20/2007  . Raynaud's syndrome 08/20/2007  . BARTHOLIN'S CYST 08/20/2007  . COLONIC POLYPS, HX OF 08/20/2007    Current Outpatient Medications  Medication Sig Dispense Refill  . Diclofenac Sodium (PENNSAID) 2 % SOLN Place 2 application onto the skin 2 (two) times daily. 112 g 3  . Estradiol (VAGIFEM) 10 MCG TABS vaginal tablet Place 1 tablet (10 mcg total) vaginally 2 (two) times a week. 24 tablet 3  . fluticasone (FLONASE) 50 MCG/ACT nasal spray Place 2 sprays into both nostrils daily. 16 g 6  . gabapentin  (NEURONTIN) 100 MG capsule Take 2 capsules (200 mg total) by mouth at bedtime. 180 capsule 3  . ibuprofen (ADVIL,MOTRIN) 200 MG tablet Take 400 mg by mouth every 6 (six) hours as needed for moderate pain.    . meloxicam (MOBIC) 15 MG tablet Take 1 tablet (15 mg total) by mouth daily. 90 tablet 3  . rosuvastatin (CRESTOR) 10 MG tablet Take 1 tablet (10 mg total) by mouth daily. 90 tablet 3  . zolpidem (AMBIEN) 10 MG tablet Take 1 tablet (10 mg total) by mouth at bedtime as needed for sleep. 90 tablet 1  . cefdinir (OMNICEF) 300 MG capsule Take 1 capsule (300 mg total) by mouth 2 (two) times daily. 20 capsule 0   No current facility-administered medications for this visit.     Allergies: Patient has no known allergies.  Past Medical History:  Diagnosis Date  . ALLERGIC RHINITIS 10/01/2007  . ANXIETY 11/10/2010  . ATTENTION DEFICIT DISORDER 11/10/2010  . BARTHOLIN'S CYST 08/20/2007  . Carpal tunnel syndrome 08/20/2007  . COLONIC POLYPS, HX OF 08/20/2007  . DEPRESSION 08/20/2007  . EMPHYSEMA 06/16/2010  . HOT FLASHES 09/26/2009  . HYPERLIPIDEMIA 08/20/2007  . HYPERTENSION 08/20/2007  . Insomnia, unspecified 10/01/2007  . ISCHEMIC COLITIS, HX OF 10/01/2007  . MENOPAUSAL DISORDER 11/07/2009  . OBESITY, MILD 08/20/2007  . OSTEOPENIA 06/16/2010  . POLYARTHRALGIA 11/06/2008  . Raynaud's syndrome 08/20/2007    Past Surgical History:  Procedure Laterality Date  . TONSILLECTOMY      Family History  Problem Relation Age of Onset  . Osteoporosis Mother   . ADD / ADHD Daughter   . Osteoporosis  Maternal Grandmother   . Breast cancer Maternal Grandmother   . Coronary artery disease Other   . Hypertension Other   . Prostate cancer Other   . Colon cancer Neg Hx     Social History   Tobacco Use  . Smoking status: Former Smoker    Last attempt to quit: 12/27/1990    Years since quitting: 27.8  . Smokeless tobacco: Never Used  Substance Use Topics  . Alcohol use: Yes    Comment: one drink per day     Subjective:  Patient presents with concerns for worsening cough/ congestion x 4 days; notes that chest feels "tight." increased chills at night; using Therflu/ Nyquil with some benefit; occasional wheezing noted;     Objective:  Vitals:   11/02/18 1019  BP: 108/80  Pulse: 77  Temp: 98.6 F (37 C)  TempSrc: Oral  SpO2: 98%  Weight: 154 lb 1.9 oz (69.9 kg)  Height: 5\' 3"  (1.6 m)    General: Well developed, well nourished, in no acute distress  Skin : Warm and dry.  Head: Normocephalic and atraumatic  Eyes: Sclera and conjunctiva clear; pupils round and reactive to light; extraocular movements intact  Ears: External normal; canals clear; tympanic membranes normal  Oropharynx: Pink, supple. No suspicious lesions  Neck: Supple without thyromegaly, adenopathy  Lungs: Respirations unlabored; coarse breath sounds noted CVS exam: normal rate and regular rhythm.  Neurologic: Alert and oriented; speech intact; face symmetrical; moves all extremities well; CNII-XII intact without focal deficit   Assessment:  1. Acute sinusitis, recurrence not specified, unspecified location   2. Acute bronchitis, unspecified organism     Plan:  Rx for Omnicef 300 mg bid x 10 days, continue Flonase; Sample of BREO100 mg qd x 7 days; okay to continue Nyquil qhs; work note for today; increase fluids, rest and follow-up worse, no better.   No follow-ups on file.  No orders of the defined types were placed in this encounter.   Requested Prescriptions   Signed Prescriptions Disp Refills  . cefdinir (OMNICEF) 300 MG capsule 20 capsule 0    Sig: Take 1 capsule (300 mg total) by mouth 2 (two) times daily.

## 2018-11-02 NOTE — Telephone Encounter (Signed)
Done erx 

## 2018-11-08 ENCOUNTER — Ambulatory Visit: Payer: Self-pay

## 2018-11-08 ENCOUNTER — Ambulatory Visit (INDEPENDENT_AMBULATORY_CARE_PROVIDER_SITE_OTHER)
Admission: RE | Admit: 2018-11-08 | Discharge: 2018-11-08 | Disposition: A | Discharge status: Home / Self Care | Payer: Commercial Managed Care - PPO | Source: Ambulatory Visit | Attending: Internal Medicine | Admitting: Internal Medicine

## 2018-11-08 ENCOUNTER — Ambulatory Visit (INDEPENDENT_AMBULATORY_CARE_PROVIDER_SITE_OTHER): Payer: Commercial Managed Care - PPO | Admitting: Internal Medicine

## 2018-11-08 ENCOUNTER — Encounter: Payer: Self-pay | Admitting: Internal Medicine

## 2018-11-08 VITALS — BP 118/82 | HR 57 | Temp 98.4°F | Ht 63.0 in | Wt 158.0 lb

## 2018-11-08 DIAGNOSIS — R05 Cough: Secondary | ICD-10-CM

## 2018-11-08 DIAGNOSIS — R0602 Shortness of breath: Secondary | ICD-10-CM | POA: Diagnosis not present

## 2018-11-08 DIAGNOSIS — R739 Hyperglycemia, unspecified: Secondary | ICD-10-CM | POA: Diagnosis not present

## 2018-11-08 DIAGNOSIS — R059 Cough, unspecified: Secondary | ICD-10-CM | POA: Insufficient documentation

## 2018-11-08 DIAGNOSIS — R062 Wheezing: Secondary | ICD-10-CM | POA: Diagnosis not present

## 2018-11-08 MED ORDER — ALBUTEROL SULFATE HFA 108 (90 BASE) MCG/ACT IN AERS: 2.0000 | INHALATION_SPRAY | Freq: Four times a day (QID) | RESPIRATORY_TRACT | 2 refills | Status: DC | PRN

## 2018-11-08 MED ORDER — PREDNISONE 10 MG PO TABS: ORAL_TABLET | ORAL | 0 refills | Status: DC

## 2018-11-08 MED ORDER — METHYLPREDNISOLONE ACETATE 80 MG/ML IJ SUSP
80.0000 mg | Freq: Once | INTRAMUSCULAR | Status: AC
  Administered 2018-11-08: 80 mg via INTRAMUSCULAR

## 2018-11-08 MED ORDER — HYDROCODONE-HOMATROPINE 5-1.5 MG/5ML PO SYRP: 5.0000 mL | ORAL_SOLUTION | Freq: Four times a day (QID) | ORAL | 0 refills | Status: AC | PRN

## 2018-11-08 MED ORDER — AZITHROMYCIN 250 MG PO TABS: ORAL_TABLET | ORAL | 1 refills | Status: DC

## 2018-11-08 NOTE — Telephone Encounter (Signed)
Pt c/o hard to take a deep breath in and feels labored and achy when she breathes in for the past week. Pt stated it feels constant. Pt stated that she is having mild shortness of breath. Pt talking in full sentences and did not sound out of breath. Pt was seen in the office 11/02/18 and diagnosed with acute sinusitis and bronchitis. Pt was prescribed cefdinir and has 3 days left on her course of antibiotics. Pt stated that she had chills last night but didn't take her temperature. Pt sounds hoarse and has a productive cough with pale yellow phlegm and a runny nose. Pt stated that even though she feels better, she thinks she needs a stronger antibiotic. Care advice given and pt verbalized understanding. Advised to continue nasal saline washing. Appt made for pt today at 4:00 pm with PCP.  Reason for Disposition . [1] MILD difficulty breathing (e.g., minimal/no SOB at rest, SOB with walking, pulse <100) AND [2] NEW-onset or WORSE than normal    Was seen 11/02/18 Pt stated that she is "a little better" but feel she sated  needs a stronger antibiotic. Denies SOB with walking.. Did not sound SOB at rest.  Answer Assessment - Initial Assessment Questions 1. RESPIRATORY STATUS: "Describe your breathing?" (e.g., wheezing, shortness of breath, unable to speak, severe coughing)      Hard to take a deep breath in  And feels labored and feels achy when she breathes in 2. ONSET: "When did this breathing problem begin?"      For the past week 3. PATTERN "Does the difficult breathing come and go, or has it been constant since it started?"      constant 4. SEVERITY: "How bad is your breathing?" (e.g., mild, moderate, severe)    - MILD: No SOB at rest, mild SOB with walking, speaks normally in sentences, can lay down, no retractions, pulse < 100.    - MODERATE: SOB at rest, SOB with minimal exertion and prefers to sit, cannot lie down flat, speaks in phrases, mild retractions, audible wheezing, pulse 100-120.    -  SEVERE: Very SOB at rest, speaks in single words, struggling to breathe, sitting hunched forward, retractions, pulse > 120  Mild- no SOB with walking 5. RECURRENT SYMPTOM: "Have you had difficulty breathing before?" If so, ask: "When was the last time?" and "What happened that time?"      Last year- sinus and respiratory infection and was prescribed antibiotics 6. CARDIAC HISTORY: "Do you have any history of heart disease?" (e.g., heart attack, angina, bypass surgery, angioplasty)      no 7. LUNG HISTORY: "Do you have any history of lung disease?"  (e.g., pulmonary embolus, asthma, emphysema)     no 8. CAUSE: "What do you think is causing the breathing problem?"      Sinus and respiratory infection 9. OTHER SYMPTOMS: "Do you have any other symptoms? (e.g., dizziness, runny nose, cough, chest pain, fever)     Felt chills last night thinks she had a slight fever, chest achy with deep breaths, productive cough-pale yellow, runny nose 10. PREGNANCY: "Is there any chance you are pregnant?" "When was your last menstrual period?"       n/a 11. TRAVEL: "Have you traveled out of the country in the last month?" (e.g., travel history, exposures)       no  Protocols used: BREATHING DIFFICULTY-A-AH

## 2018-11-08 NOTE — Assessment & Plan Note (Signed)
Mild to mod, cant r/o pna, for cxr, for change to zpack, cough med prn,  to f/u any worsening symptoms or concerns

## 2018-11-08 NOTE — Patient Instructions (Signed)
You had the steroid shot today  OK to stop your cefdiner  Please take all new medication as prescribed - the zpack, prednisone, and cough medicine and albuterol inhaler if needed  Please continue all other medications as before, and refills have been done if requested.  Please have the pharmacy call with any other refills you may need.  Please keep your appointments with your specialists as you may have planned  Please go to the XRAY Department in the Basement (go straight as you get off the elevator) for the x-ray testing  You will be contacted by phone if any changes need to be made immediately.  Otherwise, you will receive a letter about your results with an explanation, but please check with MyChart first.  Please remember to sign up for MyChart if you have not done so, as this will be important to you in the future with finding out test results, communicating by private email, and scheduling acute appointments online when needed.

## 2018-11-08 NOTE — Assessment & Plan Note (Signed)
Mild to mod, for depomedrol IM 80, predpac asd, to f/u any worsening symptoms or concerns 

## 2018-11-08 NOTE — Assessment & Plan Note (Signed)
stable overall by history and exam, recent data reviewed with pt, and pt to continue medical treatment as before,  to f/u any worsening symptoms or concerns  

## 2018-11-08 NOTE — Progress Notes (Signed)
Subjective:    Patient ID: Annette [name], female    DOB: 07-12-1956, 62 y.o.   MRN: [id number]  HPI  Here after recent tx for bronchitis/sinusitis with cefdinir, overall some improvement in less productive cough, but unfortunately with mild worsening dyspnea, wheezing, tightness for last several days. Here with also general weakness and malaise, but Pt denies chest pain, orthopnea, PND, increased LE swelling, palpitations, dizziness or syncope.  Pt denies new neurological symptoms such as new headache, or facial or extremity weakness or numbness   Pt denies polydipsia, polyuria Past Medical History:  Diagnosis Date  . ALLERGIC RHINITIS 10/01/2007  . ANXIETY 11/10/2010  . ATTENTION DEFICIT DISORDER 11/10/2010  . BARTHOLIN'S CYST 08/20/2007  . Carpal tunnel syndrome 08/20/2007  . COLONIC POLYPS, HX OF 08/20/2007  . DEPRESSION 08/20/2007  . EMPHYSEMA 06/16/2010  . HOT FLASHES 09/26/2009  . HYPERLIPIDEMIA 08/20/2007  . HYPERTENSION 08/20/2007  . Insomnia, unspecified 10/01/2007  . ISCHEMIC COLITIS, HX OF 10/01/2007  . MENOPAUSAL DISORDER 11/07/2009  . OBESITY, MILD 08/20/2007  . OSTEOPENIA 06/16/2010  . POLYARTHRALGIA 11/06/2008  . Raynaud's syndrome 08/20/2007   Past Surgical History:  Procedure Laterality Date  . TONSILLECTOMY      reports that she quit smoking about 27 years ago. She has never used smokeless tobacco. She reports that she drinks alcohol. She reports that she does not use drugs. family history includes ADD / ADHD in her daughter; Breast cancer in her maternal grandmother; Coronary artery disease in her other; Hypertension in her other; Osteoporosis in her maternal grandmother and mother; Prostate cancer in her other. No Known Allergies Current Outpatient Medications on File Prior to Visit  Medication Sig Dispense Refill  . cefdinir (OMNICEF) 300 MG capsule Take 1 capsule (300 mg total) by mouth 2 (two) times daily. 20 capsule 0  . Diclofenac Sodium (PENNSAID) 2 % SOLN Place 2  application onto the skin 2 (two) times daily. 112 g 3  . Estradiol (VAGIFEM) 10 MCG TABS vaginal tablet Place 1 tablet (10 mcg total) vaginally 2 (two) times a week. 24 tablet 3  . fluticasone (FLONASE) 50 MCG/ACT nasal spray Place 2 sprays into both nostrils daily. 16 g 6  . gabapentin (NEURONTIN) 100 MG capsule Take 2 capsules (200 mg total) by mouth at bedtime. 180 capsule 3  . ibuprofen (ADVIL,MOTRIN) 200 MG tablet Take 400 mg by mouth every 6 (six) hours as needed for moderate pain.    . meloxicam (MOBIC) 15 MG tablet Take 1 tablet (15 mg total) by mouth daily. 90 tablet 3  . rosuvastatin (CRESTOR) 10 MG tablet Take 1 tablet (10 mg total) by mouth daily. 90 tablet 3  . zolpidem (AMBIEN) 10 MG tablet TAKE 1 TABLET BY MOUTH AT BEDTIME AS NEEDED FOR SLEEP. 90 tablet 0   No current facility-administered medications on file prior to visit.    Review of Systems  Constitutional: Negative for other unusual diaphoresis or sweats HENT: Negative for ear discharge or swelling Eyes: Negative for other worsening visual disturbances Respiratory: Negative for stridor or other swelling  Gastrointestinal: Negative for worsening distension or other blood Genitourinary: Negative for retention or other urinary change Musculoskeletal: Negative for other MSK pain or swelling Skin: Negative for color change or other new lesions Neurological: Negative for worsening tremors and other numbness  Psychiatric/Behavioral: Negative for worsening agitation or other fatigue All other system neg per pt    Objective:   Physical Exam BP 118/82   Pulse (!) 57   Temp  98.4 F (36.9 C) (Oral)   Ht 5\' 3"  (1.6 m)   Wt 158 lb (71.7 kg)   SpO2 98%   BMI 27.99 kg/m  VS noted,  Constitutional: Pt appears in NAD HENT: Head: NCAT.  Right Ear: External ear normal.  Left Ear: External ear normal.  Eyes: . Pupils are equal, round, and reactive to light. Conjunctivae and EOM are normal Nose: without d/c or  deformity Neck: Neck supple. Gross normal ROM Cardiovascular: Normal rate and regular rhythm.   Pulmonary/Chest: Effort normal and breath sounds decreased Abd:  Soft, NT, ND, + BS, no organomegaly Neurological: Pt is alert. At baseline orientation, motor grossly intact Skin: Skin is warm. No rashes, other new lesions, no LE edema Psychiatric: Pt behavior is normal without agitation  No other exam findings except Few RLL rales, decreased BS with few rhonichi and trace wheeze Lab Results  Component Value Date   WBC 6.1 01/26/2018   HGB 12.4 01/26/2018   HCT 37.7 01/26/2018   PLT 240.0 01/26/2018   GLUCOSE 97 01/26/2018   CHOL 140 01/26/2018   TRIG 74.0 01/26/2018   HDL 52.20 01/26/2018   LDLDIRECT 137.0 12/11/2013   LDLCALC 73 01/26/2018   ALT 16 01/26/2018   AST 20 01/26/2018   NA 140 01/26/2018   K 4.2 01/26/2018   CL 107 01/26/2018   CREATININE 1.10 01/26/2018   BUN 19 01/26/2018   CO2 27 01/26/2018   TSH 2.40 01/26/2018       Assessment & Plan:

## 2018-11-09 ENCOUNTER — Ambulatory Visit: Payer: Commercial Managed Care - PPO | Admitting: Family

## 2018-11-14 ENCOUNTER — Telehealth: Payer: Self-pay

## 2018-11-14 NOTE — Telephone Encounter (Signed)
Copied from [place] [id number]. Topic: Appointment Scheduling - Scheduling Inquiry for Clinic >> Nov 14, 2018 11:07 AM Sheran Luz wrote: Reason for UOR:[facility] called to reschedule appointment on 12/11. Patient would like to know if Dr. Tamala Julian would be willing to work her in on 12/2-prefers late afternoon. Please advise.

## 2018-11-14 NOTE — Telephone Encounter (Signed)
Left message for patient to call back  

## 2018-11-21 NOTE — Telephone Encounter (Signed)
Scheduled patient on 12/2.

## 2018-11-22 DIAGNOSIS — Z1231 Encounter for screening mammogram for malignant neoplasm of breast: Secondary | ICD-10-CM | POA: Diagnosis not present

## 2018-11-22 LAB — HM MAMMOGRAPHY

## 2018-11-27 ENCOUNTER — Encounter: Payer: Self-pay | Admitting: Family Medicine

## 2018-11-27 ENCOUNTER — Ambulatory Visit (INDEPENDENT_AMBULATORY_CARE_PROVIDER_SITE_OTHER): Payer: Commercial Managed Care - PPO | Admitting: Family Medicine

## 2018-11-27 VITALS — BP 110/78 | HR 52 | Ht 63.0 in | Wt 156.0 lb

## 2018-11-27 DIAGNOSIS — M533 Sacrococcygeal disorders, not elsewhere classified: Secondary | ICD-10-CM

## 2018-11-27 DIAGNOSIS — M999 Biomechanical lesion, unspecified: Secondary | ICD-10-CM | POA: Diagnosis not present

## 2018-11-27 NOTE — Assessment & Plan Note (Signed)
Decision today to treat with OMT was based on Physical Exam  After verbal consent patient was treated with HVLA, ME, FPR techniques in cervical, thoracic, rib,  lumbar and sacral areas  Patient tolerated the procedure well with improvement in symptoms  Patient given exercises, stretches and lifestyle modifications  See medications in patient instructions if given  Patient will follow up in 4-8 weeks 

## 2018-11-27 NOTE — Progress Notes (Signed)
[name], [place] [address] Phone: [phone] Subjective:   [name], am serving as a scribe for Dr. Hulan Saas.   CC: Back pain and neck pain follow-up  JOA:[id number]  Annette [name] is a 62 y.o. female coming in with complaint of back pain. Did travel to [facility] for holiday. No issues with traveling. Not have any pain since last visit. Is here for OMT.  Patient states overall has been doing relatively well.  Has been having to do more activities around the house due to has been having shoulder problems.  Patient denies of any radiation to any extremities.       Past Medical History:  Diagnosis Date  . ALLERGIC RHINITIS 10/01/2007  . ANXIETY 11/10/2010  . ATTENTION DEFICIT DISORDER 11/10/2010  . BARTHOLIN'S CYST 08/20/2007  . Carpal tunnel syndrome 08/20/2007  . COLONIC POLYPS, HX OF 08/20/2007  . DEPRESSION 08/20/2007  . EMPHYSEMA 06/16/2010  . HOT FLASHES 09/26/2009  . HYPERLIPIDEMIA 08/20/2007  . HYPERTENSION 08/20/2007  . Insomnia, unspecified 10/01/2007  . ISCHEMIC COLITIS, HX OF 10/01/2007  . MENOPAUSAL DISORDER 11/07/2009  . OBESITY, MILD 08/20/2007  . OSTEOPENIA 06/16/2010  . POLYARTHRALGIA 11/06/2008  . Raynaud's syndrome 08/20/2007   Past Surgical History:  Procedure Laterality Date  . TONSILLECTOMY     Social History   Socioeconomic History  . Marital status: Married    Spouse name: Not on file  . Number of children: 1  . Years of education: Not on file  . Highest education level: Not on file  Occupational History    Employer: [employer]  Social Needs  . Financial resource strain: Not on file  . Food insecurity:    Worry: Not on file    Inability: Not on file  . Transportation needs:    Medical: Not on file    Non-medical: Not on file  Tobacco Use  . Smoking status: Former Smoker    Last attempt to quit: 12/27/1990    Years since quitting: 27.9  . Smokeless tobacco: Never Used    Substance and Sexual Activity  . Alcohol use: Yes    Comment: one drink per day  . Drug use: No  . Sexual activity: Not on file  Lifestyle  . Physical activity:    Days per week: Not on file    Minutes per session: Not on file  . Stress: Not on file  Relationships  . Social connections:    Talks on phone: Not on file    Gets together: Not on file    Attends religious service: Not on file    Active member of club or organization: Not on file    Attends meetings of clubs or organizations: Not on file    Relationship status: Not on file  Other Topics Concern  . Not on file  Social History Narrative  . Not on file   No Known Allergies Family History  Problem Relation Age of Onset  . Osteoporosis Mother   . ADD / ADHD Daughter   . Osteoporosis Maternal Grandmother   . Breast cancer Maternal Grandmother   . Coronary artery disease Other   . Hypertension Other   . Prostate cancer Other   . Colon cancer Neg Hx     Current Outpatient Medications (Endocrine & Metabolic):  .  predniSONE (DELTASONE) 10 MG tablet, 3 tabs by mouth per day for 3 days,2tabs per day for 3 days,1tab per day  for 3 days  Current Outpatient Medications (Cardiovascular):  .  rosuvastatin (CRESTOR) 10 MG tablet, Take 1 tablet (10 mg total) by mouth daily.  Current Outpatient Medications (Respiratory):  .  albuterol (PROVENTIL HFA;VENTOLIN HFA) 108 (90 Base) MCG/ACT inhaler, Inhale 2 puffs into the lungs every 6 (six) hours as needed for wheezing or shortness of breath. .  fluticasone (FLONASE) 50 MCG/ACT nasal spray, Place 2 sprays into both nostrils daily.  Current Outpatient Medications (Analgesics):  .  ibuprofen (ADVIL,MOTRIN) 200 MG tablet, Take 400 mg by mouth every 6 (six) hours as needed for moderate pain. .  meloxicam (MOBIC) 15 MG tablet, Take 1 tablet (15 mg total) by mouth daily.   Current Outpatient Medications (Other):  .  azithromycin (ZITHROMAX Z-PAK) 250 MG tablet, 2 tab by mouth day  1, then 1 per day .  cefdinir (OMNICEF) 300 MG capsule, Take 1 capsule (300 mg total) by mouth 2 (two) times daily. .  Diclofenac Sodium (PENNSAID) 2 % SOLN, Place 2 application onto the skin 2 (two) times daily. .  Estradiol (VAGIFEM) 10 MCG TABS vaginal tablet, Place 1 tablet (10 mcg total) vaginally 2 (two) times a week. .  gabapentin (NEURONTIN) 100 MG capsule, Take 2 capsules (200 mg total) by mouth at bedtime. Marland Kitchen  zolpidem (AMBIEN) 10 MG tablet, TAKE 1 TABLET BY MOUTH AT BEDTIME AS NEEDED FOR SLEEP.    Past medical history, social, surgical and family history all reviewed in electronic medical record.  No pertanent information unless stated regarding to the chief complaint.   Review of Systems:  No headache, visual changes, nausea, vomiting, diarrhea, constipation, dizziness, abdominal pain, skin rash, fevers, chills, night sweats, weight loss, swollen lymph nodes, body aches, joint swelling, , chest pain, shortness of breath, mood changes.  Positive muscle aches  Objective  Blood pressure 110/78, pulse (!) 52, height 5\' 3"  (1.6 m), weight 156 lb (70.8 kg), SpO2 96 %.    General: No apparent distress alert and oriented x3 mood and affect normal, dressed appropriately.  HEENT: Pupils equal, extraocular movements intact  Respiratory: Patient's speak in full sentences and does not appear short of breath  Cardiovascular: No lower extremity edema, non tender, no erythema  Skin: Warm dry intact with no signs of infection or rash on extremities or on axial skeleton.  Abdomen: Soft nontender  Neuro: Cranial nerves II through XII are intact, neurovascularly intact in all extremities with 2+ DTRs and 2+ pulses.  Lymph: No lymphadenopathy of posterior or anterior cervical chain or axillae bilaterally.  Gait normal with good balance and coordination.  MSK:  Non tender with full range of motion and good stability and symmetric strength and tone of shoulders, elbows, wrist, hip, knee and ankles  bilaterally.  Back Exam:  Inspection: Unremarkable  Motion: Flexion 45 deg, Extension 30 deg, Side Bending to 45 deg bilaterally,  Rotation to 45 deg bilaterally  SLR laying: Negative  XSLR laying: Negative  Palpable tenderness: None. FABER: Tightness bilaterally. Sensory change: Gross sensation intact to all lumbar and sacral dermatomes.  Reflexes: 2+ at both patellar tendons, 2+ at achilles tendons, Babinski's downgoing.  Strength at foot  Plantar-flexion: 5/5 Dorsi-flexion: 5/5 Eversion: 5/5 Inversion: 5/5  Leg strength  Quad: 5/5 Hamstring: 5/5 Hip flexor: 5/5 Hip abductors: 5/5  Gait unremarkable.   Osteopathic findings  C5 flexed rotated and side bent right T5 extended rotated and side bent right inhaled rib T6 extended rotated and side bent left L2 flexed rotated and side bent right  Sacrum right on right    Impression and Recommendations:     This case required medical decision making of moderate complexity. The above documentation has been reviewed and is accurate and complete Lyndal Pulley, DO       Note: This dictation was prepared with Dragon dictation along with smaller phrase technology. Any transcriptional errors that result from this process are unintentional.

## 2018-11-27 NOTE — Assessment & Plan Note (Signed)
Patient is doing remarkably well.  Discussed icing regimen and home exercises.  Discussed which activities doing which would avoid.  Discussed topical anti-inflammatories.  Patient will start to increase activity.  Patient will avoid certain stretches that I think could be unbearable from time to time.  Follow-up with me again in 4 to 8 weeks.

## 2018-11-29 ENCOUNTER — Ambulatory Visit: Payer: Commercial Managed Care - PPO | Admitting: Family Medicine

## 2018-12-06 ENCOUNTER — Ambulatory Visit: Payer: Commercial Managed Care - PPO | Admitting: Family Medicine

## 2019-01-03 ENCOUNTER — Telehealth: Payer: Self-pay

## 2019-01-03 NOTE — Telephone Encounter (Signed)
Pt already has active lab orders waiting. She can come at her earliest convenience.   Copied from [place] [phone]. Topic: General - Inquiry >> Jan 02, 2019  4:41 PM Virl Axe D wrote: Reason for CRM: Pt is requesting to have labs done before her CPE on 01/24/19. Please advise.

## 2019-01-10 ENCOUNTER — Encounter: Payer: Self-pay | Admitting: Family Medicine

## 2019-01-10 ENCOUNTER — Ambulatory Visit (INDEPENDENT_AMBULATORY_CARE_PROVIDER_SITE_OTHER): Payer: PRIVATE HEALTH INSURANCE | Admitting: Family Medicine

## 2019-01-10 VITALS — BP 98/70 | HR 64 | Ht 63.0 in | Wt 156.0 lb

## 2019-01-10 DIAGNOSIS — M999 Biomechanical lesion, unspecified: Secondary | ICD-10-CM

## 2019-01-10 DIAGNOSIS — M533 Sacrococcygeal disorders, not elsewhere classified: Secondary | ICD-10-CM

## 2019-01-10 NOTE — Patient Instructions (Addendum)
Always great to see you  One knee down one knee up tilt pelvis forward.  Hands overhead then rotate to upper leg.  Hold 10 seconds, relax, repeat and do other side as well.  Very important when after being in a flexed position for a long amount of time.  See me again in 2 monthss

## 2019-01-10 NOTE — Progress Notes (Signed)
[name] [address], [place] [address] Phone: [phone] Subjective:     CC: Back and hip pain follow-up  [id number]  Annette [name] is a 63 y.o. female coming in with complaint of back and hip pain. Is doing yoga twice a week. Has not had to use gabapentin and mobic as much. Does note that her pain increases when she is sitting for prolonged periods. Helps to stand up and move around but sometimes she forgets to do so during her work day.    Last imaging includes an MRI from 2010 but otherwise unremarkable at that time  Past Medical History:  Diagnosis Date  . ALLERGIC RHINITIS 10/01/2007  . ANXIETY 11/10/2010  . ATTENTION DEFICIT DISORDER 11/10/2010  . BARTHOLIN'S CYST 08/20/2007  . Carpal tunnel syndrome 08/20/2007  . COLONIC POLYPS, HX OF 08/20/2007  . DEPRESSION 08/20/2007  . EMPHYSEMA 06/16/2010  . HOT FLASHES 09/26/2009  . HYPERLIPIDEMIA 08/20/2007  . HYPERTENSION 08/20/2007  . Insomnia, unspecified 10/01/2007  . ISCHEMIC COLITIS, HX OF 10/01/2007  . MENOPAUSAL DISORDER 11/07/2009  . OBESITY, MILD 08/20/2007  . OSTEOPENIA 06/16/2010  . POLYARTHRALGIA 11/06/2008  . Raynaud's syndrome 08/20/2007   Past Surgical History:  Procedure Laterality Date  . TONSILLECTOMY     Social History   Socioeconomic History  . Marital status: Married    Spouse name: Not on file  . Number of children: 1  . Years of education: Not on file  . Highest education level: Not on file  Occupational History    Employer: [employer]  Social Needs  . Financial resource strain: Not on file  . Food insecurity:    Worry: Not on file    Inability: Not on file  . Transportation needs:    Medical: Not on file    Non-medical: Not on file  Tobacco Use  . Smoking status: Former Smoker    Last attempt to quit: 12/27/1990    Years since quitting: 28.0  . Smokeless tobacco: Never Used  Substance and Sexual Activity  . Alcohol use: Yes    Comment:  one drink per day  . Drug use: No  . Sexual activity: Not on file  Lifestyle  . Physical activity:    Days per week: Not on file    Minutes per session: Not on file  . Stress: Not on file  Relationships  . Social connections:    Talks on phone: Not on file    Gets together: Not on file    Attends religious service: Not on file    Active member of club or organization: Not on file    Attends meetings of clubs or organizations: Not on file    Relationship status: Not on file  Other Topics Concern  . Not on file  Social History Narrative  . Not on file   No Known Allergies Family History  Problem Relation Age of Onset  . Osteoporosis Mother   . ADD / ADHD Daughter   . Osteoporosis Maternal Grandmother   . Breast cancer Maternal Grandmother   . Coronary artery disease Other   . Hypertension Other   . Prostate cancer Other   . Colon cancer Neg Hx      Current Outpatient Medications (Cardiovascular):  .  rosuvastatin (CRESTOR) 10 MG tablet, Take 1 tablet (10 mg total) by mouth daily.  Current Outpatient Medications (Respiratory):  .  albuterol (PROVENTIL HFA;VENTOLIN HFA) 108 (90 Base) MCG/ACT inhaler, Inhale 2  puffs into the lungs every 6 (six) hours as needed for wheezing or shortness of breath. .  fluticasone (FLONASE) 50 MCG/ACT nasal spray, Place 2 sprays into both nostrils daily.  Current Outpatient Medications (Analgesics):  .  ibuprofen (ADVIL,MOTRIN) 200 MG tablet, Take 400 mg by mouth every 6 (six) hours as needed for moderate pain. .  meloxicam (MOBIC) 15 MG tablet, Take 1 tablet (15 mg total) by mouth daily.   Current Outpatient Medications (Other):  Marland Kitchen  Diclofenac Sodium (PENNSAID) 2 % SOLN, Place 2 application onto the skin 2 (two) times daily. .  Estradiol (VAGIFEM) 10 MCG TABS vaginal tablet, Place 1 tablet (10 mcg total) vaginally 2 (two) times a week. .  gabapentin (NEURONTIN) 100 MG capsule, Take 2 capsules (200 mg total) by mouth at bedtime. Marland Kitchen  zolpidem  (AMBIEN) 10 MG tablet, TAKE 1 TABLET BY MOUTH AT BEDTIME AS NEEDED FOR SLEEP.    Past medical history, social, surgical and family history all reviewed in electronic medical record.  No pertanent information unless stated regarding to the chief complaint.   Review of Systems:  No headache, visual changes, nausea, vomiting, diarrhea, constipation, dizziness, abdominal pain, skin rash, fevers, chills, night sweats, weight loss, swollen lymph nodes, body aches, joint swelling, muscle aches, chest pain, shortness of breath, mood changes.   Objective  Blood pressure 98/70, pulse 64, height 5\' 3"  (1.6 m), weight 156 lb (70.8 kg), SpO2 96 %.    General: No apparent distress alert and oriented x3 mood and affect normal, dressed appropriately.  HEENT: Pupils equal, extraocular movements intact  Respiratory: Patient's speak in full sentences and does not appear short of breath  Cardiovascular: No lower extremity edema, non tender, no erythema  Skin: Warm dry intact with no signs of infection or rash on extremities or on axial skeleton.  Abdomen: Soft nontender  Neuro: Cranial nerves II through XII are intact, neurovascularly intact in all extremities with 2+ DTRs and 2+ pulses.  Lymph: No lymphadenopathy of posterior or anterior cervical chain or axillae bilaterally.  Gait normal with good balance and coordination.  MSK:  Non tender with full range of motion and good stability and symmetric strength and tone of shoulders, elbows, wrist, hip, knee and ankles bilaterally.  Back Exam:  Inspection: Unremarkable  Motion: Flexion 30 deg, Extension 25 deg, Side Bending to 35 deg bilaterally,  Rotation to 35 deg bilaterally  SLR laying: Negative  XSLR laying: Negative  Palpable tenderness: Mild tenderness over the right sacroiliac joint. FABER: Positive Faber right. Sensory change: Gross sensation intact to all lumbar and sacral dermatomes.  Reflexes: 2+ at both patellar tendons, 2+ at achilles  tendons, Babinski's downgoing.  Strength at foot  Plantar-flexion: 5/5 Dorsi-flexion: 5/5 Eversion: 5/5 Inversion: 5/5  Leg strength  Quad: 5/5 Hamstring: 5/5 Hip flexor: 5/5 Hip abductors: 5/5   Osteopathic findings C3 flexed rotated and side bent left  T3 extended rotated and side bent right inhaled third rib T8 extended rotated and side bent left L2 flexed rotated and side bent right Sacrum right on right    Impression and Recommendations:     This case required medical decision making of moderate complexity. The above documentation has been reviewed and is accurate and complete Hulan Saas.        Note: This dictation was prepared with Dragon dictation along with smaller phrase technology. Any transcriptional errors that result from this process are unintentional.

## 2019-01-10 NOTE — Assessment & Plan Note (Addendum)
Decision today to treat with OMT was based on Physical Exam  After verbal consent patient was treated with HVLA, ME, FPR techniques in cervical, thoracic, rib, lumbar and sacral areas  Patient tolerated the procedure well with improvement in symptoms  Patient given exercises, stretches and lifestyle modifications  See medications in patient instructions if given  Patient will follow up in 6 weeks 

## 2019-01-10 NOTE — Assessment & Plan Note (Signed)
Patient is patient has had discomfort with this previously.  Has been responding well to osteopathic manipulation, core strengthening.  Has started running which is a new activity for patient.  Patient is to increase activity slowly.  Follow-up with me again in 2 months.

## 2019-01-19 ENCOUNTER — Other Ambulatory Visit (INDEPENDENT_AMBULATORY_CARE_PROVIDER_SITE_OTHER): Payer: PRIVATE HEALTH INSURANCE

## 2019-01-19 DIAGNOSIS — Z0001 Encounter for general adult medical examination with abnormal findings: Secondary | ICD-10-CM | POA: Diagnosis not present

## 2019-01-19 LAB — CBC WITH DIFFERENTIAL/PLATELET
Basophils Absolute: 0 10*3/uL (ref 0.0–0.1)
Basophils Relative: 0.5 % (ref 0.0–3.0)
EOS PCT: 1.8 % (ref 0.0–5.0)
Eosinophils Absolute: 0.1 10*3/uL (ref 0.0–0.7)
HCT: 41.6 % (ref 36.0–46.0)
Hemoglobin: 13.6 g/dL (ref 12.0–15.0)
LYMPHS ABS: 3 10*3/uL (ref 0.7–4.0)
Lymphocytes Relative: 46.5 % — ABNORMAL HIGH (ref 12.0–46.0)
MCHC: 32.6 g/dL (ref 30.0–36.0)
MCV: 86.9 fl (ref 78.0–100.0)
MONO ABS: 0.4 10*3/uL (ref 0.1–1.0)
MONOS PCT: 6.3 % (ref 3.0–12.0)
NEUTROS ABS: 2.9 10*3/uL (ref 1.4–7.7)
NEUTROS PCT: 44.9 % (ref 43.0–77.0)
PLATELETS: 241 10*3/uL (ref 150.0–400.0)
RBC: 4.78 Mil/uL (ref 3.87–5.11)
RDW: 15 % (ref 11.5–15.5)
WBC: 6.5 10*3/uL (ref 4.0–10.5)

## 2019-01-19 LAB — URINALYSIS, ROUTINE W REFLEX MICROSCOPIC
Bilirubin Urine: NEGATIVE
KETONES UR: NEGATIVE
NITRITE: NEGATIVE
SPECIFIC GRAVITY, URINE: 1.015 (ref 1.000–1.030)
TOTAL PROTEIN, URINE-UPE24: NEGATIVE
URINE GLUCOSE: NEGATIVE
Urobilinogen, UA: 0.2 (ref 0.0–1.0)
pH: 5.5 (ref 5.0–8.0)

## 2019-01-19 LAB — LIPID PANEL
Cholesterol: 215 mg/dL — ABNORMAL HIGH (ref 0–200)
HDL: 64.1 mg/dL (ref >39.00)
LDL Cholesterol: 130 mg/dL — ABNORMAL HIGH (ref 0–99)
NonHDL: 151.08
Total CHOL/HDL Ratio: 3
Triglycerides: 105 mg/dL (ref 0.0–149.0)
VLDL: 21 mg/dL (ref 0.0–40.0)

## 2019-01-19 LAB — HEPATIC FUNCTION PANEL
ALBUMIN: 4.1 g/dL (ref 3.5–5.2)
ALT: 16 U/L (ref 0–35)
AST: 20 U/L (ref 0–37)
Alkaline Phosphatase: 56 U/L (ref 39–117)
Bilirubin, Direct: 0.1 mg/dL (ref 0.0–0.3)
Total Bilirubin: 0.5 mg/dL (ref 0.2–1.2)
Total Protein: 6.5 g/dL (ref 6.0–8.3)

## 2019-01-19 LAB — BASIC METABOLIC PANEL
BUN: 20 mg/dL (ref 6–23)
CHLORIDE: 105 meq/L (ref 96–112)
CO2: 26 meq/L (ref 19–32)
Calcium: 9.7 mg/dL (ref 8.4–10.5)
Creatinine, Ser: 1.04 mg/dL (ref 0.40–1.20)
GFR: 65.33 mL/min (ref >60.00)
GLUCOSE: 97 mg/dL (ref 70–99)
Potassium: 4.6 mEq/L (ref 3.5–5.1)
SODIUM: 138 meq/L (ref 135–145)

## 2019-01-19 LAB — TSH: TSH: 1.93 u[IU]/mL (ref 0.35–4.50)

## 2019-01-24 ENCOUNTER — Ambulatory Visit (INDEPENDENT_AMBULATORY_CARE_PROVIDER_SITE_OTHER): Payer: PRIVATE HEALTH INSURANCE | Admitting: Internal Medicine

## 2019-01-24 ENCOUNTER — Other Ambulatory Visit (INDEPENDENT_AMBULATORY_CARE_PROVIDER_SITE_OTHER): Payer: PRIVATE HEALTH INSURANCE

## 2019-01-24 ENCOUNTER — Encounter: Payer: Self-pay | Admitting: Internal Medicine

## 2019-01-24 VITALS — BP 126/82 | HR 53 | Temp 98.2°F | Ht 63.0 in | Wt 158.0 lb

## 2019-01-24 DIAGNOSIS — E785 Hyperlipidemia, unspecified: Secondary | ICD-10-CM

## 2019-01-24 DIAGNOSIS — R350 Frequency of micturition: Secondary | ICD-10-CM | POA: Diagnosis not present

## 2019-01-24 DIAGNOSIS — I10 Essential (primary) hypertension: Secondary | ICD-10-CM

## 2019-01-24 DIAGNOSIS — Z0000 Encounter for general adult medical examination without abnormal findings: Secondary | ICD-10-CM | POA: Diagnosis not present

## 2019-01-24 LAB — LIPID PANEL
Cholesterol: 202 mg/dL — ABNORMAL HIGH (ref 0–200)
HDL: 60.1 mg/dL (ref >39.00)
LDL Cholesterol: 110 mg/dL — ABNORMAL HIGH (ref 0–99)
NonHDL: 141.67
Total CHOL/HDL Ratio: 3
Triglycerides: 156 mg/dL — ABNORMAL HIGH (ref 0.0–149.0)
VLDL: 31.2 mg/dL (ref 0.0–40.0)

## 2019-01-24 LAB — HEPATIC FUNCTION PANEL
ALT: 17 U/L (ref 0–35)
AST: 20 U/L (ref 0–37)
Albumin: 4.2 g/dL (ref 3.5–5.2)
Alkaline Phosphatase: 61 U/L (ref 39–117)
Bilirubin, Direct: 0.1 mg/dL (ref 0.0–0.3)
TOTAL PROTEIN: 6.8 g/dL (ref 6.0–8.3)
Total Bilirubin: 0.3 mg/dL (ref 0.2–1.2)

## 2019-01-24 MED ORDER — ROSUVASTATIN CALCIUM 10 MG PO TABS: 10.0000 mg | ORAL_TABLET | Freq: Every day | ORAL | 3 refills | Status: DC

## 2019-01-24 MED ORDER — ROSUVASTATIN CALCIUM 10 MG PO TABS: ORAL_TABLET | ORAL | 3 refills | Status: DC

## 2019-01-24 MED ORDER — ZOLPIDEM TARTRATE 10 MG PO TABS: 10.0000 mg | ORAL_TABLET | Freq: Every evening | ORAL | 1 refills | Status: DC | PRN

## 2019-01-24 NOTE — Assessment & Plan Note (Signed)
stable overall by history and exam, recent data reviewed with pt, and pt to continue medical treatment as before,  to f/u any worsening symptoms or concerns  

## 2019-01-24 NOTE — Assessment & Plan Note (Signed)

## 2019-01-24 NOTE — Assessment & Plan Note (Signed)
Has UA c/w chronic uti, for urine cx and tx pending results

## 2019-01-24 NOTE — Patient Instructions (Signed)
You will be contacted regarding the referral for: Cardiac CT scoring (calcium)  OK to take the statin every other day  Please continue all other medications as before, and refills have been done if requested.  Please have the pharmacy call with any other refills you may need.  Please continue your efforts at being more active, low cholesterol diet, and weight control.  You are otherwise up to date with prevention measures today.  Please keep your appointments with your specialists as you may have planned  Please go to the LAB in the Basement (turn left off the elevator) for the tests to be done today - just the urine culture  You will be contacted by phone if any changes need to be made immediately.  Otherwise, you will receive a letter about your results with an explanation, but please check with MyChart first.  Please remember to sign up for MyChart if you have not done so, as this will be important to you in the future with finding out test results, communicating by private email, and scheduling acute appointments online when needed.  Please return in 6 months, or sooner if needed, with Lab testing done 3-5 days before

## 2019-01-24 NOTE — Progress Notes (Signed)
Subjective:    Patient ID: Annette [name], female    DOB: [birth year], 63 y.o.   MRN: [id number]  HPI  Here for wellness and f/u;  Overall doing ok;  Pt denies Chest pain, worsening SOB, DOE, wheezing, orthopnea, PND, worsening LE edema, palpitations, dizziness or syncope.  Pt denies neurological change such as new headache, facial or extremity weakness.  Pt denies polydipsia, polyuria, or low sugar symptoms. Pt states overall good compliance with treatment and medications, good tolerability, and has been trying to follow appropriate diet.  Pt denies worsening depressive symptoms, suicidal ideation or panic. No fever, night sweats, wt loss, loss of appetite, or other constitutional symptoms.  Pt states good ability with ADL's, has low fall risk, home safety reviewed and adequate, no other significant changes in hearing or vision, and only occasionally active with exercise   Did not try the lexapro. Admit to spotty compliance with statin but willing to try taking every other day.  Denies urinary symptoms such as dysuria, urgency, flank pain, hematuria or n/v, fever, chills., but does have some mild frequency.  Also asks for cardiac CT scoring test and willing to pay cash Past Medical History:  Diagnosis Date  . ALLERGIC RHINITIS 10/01/2007  . ANXIETY 11/10/2010  . ATTENTION DEFICIT DISORDER 11/10/2010  . BARTHOLIN'S CYST 08/20/2007  . Carpal tunnel syndrome 08/20/2007  . COLONIC POLYPS, HX OF 08/20/2007  . DEPRESSION 08/20/2007  . EMPHYSEMA 06/16/2010  . HOT FLASHES 09/26/2009  . HYPERLIPIDEMIA 08/20/2007  . HYPERTENSION 08/20/2007  . Insomnia, unspecified 10/01/2007  . ISCHEMIC COLITIS, HX OF 10/01/2007  . MENOPAUSAL DISORDER 11/07/2009  . OBESITY, MILD 08/20/2007  . OSTEOPENIA 06/16/2010  . POLYARTHRALGIA 11/06/2008  . Raynaud's syndrome 08/20/2007   Past Surgical History:  Procedure Laterality Date  . TONSILLECTOMY      reports that she quit smoking about 28 years ago. She has never used  smokeless tobacco. She reports current alcohol use. She reports that she does not use drugs. family history includes ADD / ADHD in her daughter; Breast cancer in her maternal grandmother; Coronary artery disease in an other family member; Hypertension in an other family member; Osteoporosis in her maternal grandmother and mother; Prostate cancer in an other family member. No Known Allergies Current Outpatient Medications on File Prior to Visit  Medication Sig Dispense Refill  . albuterol (PROVENTIL HFA;VENTOLIN HFA) 108 (90 Base) MCG/ACT inhaler Inhale 2 puffs into the lungs every 6 (six) hours as needed for wheezing or shortness of breath. 1 Inhaler 2  . Diclofenac Sodium (PENNSAID) 2 % SOLN Place 2 application onto the skin 2 (two) times daily. 112 g 3  . Estradiol (VAGIFEM) 10 MCG TABS vaginal tablet Place 1 tablet (10 mcg total) vaginally 2 (two) times a week. 24 tablet 3  . fluticasone (FLONASE) 50 MCG/ACT nasal spray Place 2 sprays into both nostrils daily. 16 g 6  . gabapentin (NEURONTIN) 100 MG capsule Take 2 capsules (200 mg total) by mouth at bedtime. 180 capsule 3  . ibuprofen (ADVIL,MOTRIN) 200 MG tablet Take 400 mg by mouth every 6 (six) hours as needed for moderate pain.    . meloxicam (MOBIC) 15 MG tablet Take 1 tablet (15 mg total) by mouth daily. 90 tablet 3   No current facility-administered medications on file prior to visit.    Review of Systems Constitutional: Negative for other unusual diaphoresis, sweats, appetite or weight changes HENT: Negative for other worsening hearing loss, ear pain, facial swelling, mouth sores or  neck stiffness.   Eyes: Negative for other worsening pain, redness or other visual disturbance.  Respiratory: Negative for other stridor or swelling Cardiovascular: Negative for other palpitations or other chest pain  Gastrointestinal: Negative for worsening diarrhea or loose stools, blood in stool, distention or other pain Genitourinary: Negative for  hematuria, flank pain or other change in urine volume.  Musculoskeletal: Negative for myalgias or other joint swelling.  Skin: Negative for other color change, or other wound or worsening drainage.  Neurological: Negative for other syncope or numbness. Hematological: Negative for other adenopathy or swelling Psychiatric/Behavioral: Negative for hallucinations, other worsening agitation, SI, self-injury, or new decreased concentration All other system neg per pt    Objective:   Physical Exam BP 126/82   Pulse (!) 53   Temp 98.2 F (36.8 C) (Oral)   Ht 5\' 3"  (1.6 m)   Wt 158 lb (71.7 kg)   SpO2 96%   BMI 27.99 kg/m  VS noted,  Constitutional: Pt is oriented to person, place, and time. Appears well-developed and well-nourished, in no significant distress and comfortable Head: Normocephalic and atraumatic  Eyes: Conjunctivae and EOM are normal. Pupils are equal, round, and reactive to light Right Ear: External ear normal without discharge Left Ear: External ear normal without discharge Nose: Nose without discharge or deformity Mouth/Throat: Oropharynx is without other ulcerations and moist  Neck: Normal range of motion. Neck supple. No JVD present. No tracheal deviation present or significant neck LA or mass Cardiovascular: Normal rate, regular rhythm, normal heart sounds and intact distal pulses.   Pulmonary/Chest: WOB normal and breath sounds without rales or wheezing  Abdominal: Soft. Bowel sounds are normal. NT. No HSM  Musculoskeletal: Normal range of motion. Exhibits no edema Lymphadenopathy: Has no other cervical adenopathy.  Neurological: Pt is alert and oriented to person, place, and time. Pt has normal reflexes. No cranial nerve deficit. Motor grossly intact, Gait intact Skin: Skin is warm and dry. No rash noted or new ulcerations Psychiatric:  Has normal mood and affect. Behavior is normal without agitation No other exam findings Lab Results  Component Value Date   WBC  6.5 01/19/2019   HGB 13.6 01/19/2019   HCT 41.6 01/19/2019   PLT 241.0 01/19/2019   GLUCOSE 97 01/19/2019   CHOL 202 (H) 01/24/2019   TRIG 156.0 (H) 01/24/2019   HDL 60.10 01/24/2019   LDLDIRECT 137.0 12/11/2013   LDLCALC 110 (H) 01/24/2019   ALT 17 01/24/2019   AST 20 01/24/2019   NA 138 01/19/2019   K 4.6 01/19/2019   CL 105 01/19/2019   CREATININE 1.04 01/19/2019   BUN 20 01/19/2019   CO2 26 01/19/2019   TSH 1.93 01/19/2019         Assessment & Plan:

## 2019-01-24 NOTE — Assessment & Plan Note (Signed)
Ok to take statin qod

## 2019-01-25 LAB — URINE CULTURE
MICRO NUMBER:: 121890
SPECIMEN QUALITY:: ADEQUATE

## 2019-02-13 ENCOUNTER — Encounter: Payer: Self-pay | Admitting: Internal Medicine

## 2019-03-07 ENCOUNTER — Ambulatory Visit: Payer: PRIVATE HEALTH INSURANCE | Admitting: Family Medicine

## 2019-03-14 ENCOUNTER — Inpatient Hospital Stay: Admission: RE | Admit: 2019-03-14 | Payer: PRIVATE HEALTH INSURANCE | Source: Ambulatory Visit

## 2019-03-14 ENCOUNTER — Ambulatory Visit (INDEPENDENT_AMBULATORY_CARE_PROVIDER_SITE_OTHER)
Admission: RE | Admit: 2019-03-14 | Discharge: 2019-03-14 | Disposition: A | Discharge status: Home / Self Care | Payer: Self-pay | Source: Ambulatory Visit | Attending: Internal Medicine | Admitting: Internal Medicine

## 2019-03-14 ENCOUNTER — Other Ambulatory Visit: Payer: Self-pay

## 2019-03-14 DIAGNOSIS — E785 Hyperlipidemia, unspecified: Secondary | ICD-10-CM

## 2019-03-14 DIAGNOSIS — I10 Essential (primary) hypertension: Secondary | ICD-10-CM

## 2019-03-28 ENCOUNTER — Ambulatory Visit: Payer: PRIVATE HEALTH INSURANCE | Admitting: Family Medicine

## 2019-04-17 NOTE — Progress Notes (Deleted)
[name] [address], [place] [address] Phone: [phone] Subjective:    I'm seeing this patient by the request  of:    CC:   [id number]  Annette [name] is a 63 y.o. female coming in with complaint of ***  Onset-  Location Duration-  Character- Aggravating factors- Reliving factors-  Therapies tried-  Severity-     Past Medical History:  Diagnosis Date  . ALLERGIC RHINITIS 10/01/2007  . ANXIETY 11/10/2010  . ATTENTION DEFICIT DISORDER 11/10/2010  . BARTHOLIN'S CYST 08/20/2007  . Carpal tunnel syndrome 08/20/2007  . COLONIC POLYPS, HX OF 08/20/2007  . DEPRESSION 08/20/2007  . EMPHYSEMA 06/16/2010  . HOT FLASHES 09/26/2009  . HYPERLIPIDEMIA 08/20/2007  . HYPERTENSION 08/20/2007  . Insomnia, unspecified 10/01/2007  . ISCHEMIC COLITIS, HX OF 10/01/2007  . MENOPAUSAL DISORDER 11/07/2009  . OBESITY, MILD 08/20/2007  . OSTEOPENIA 06/16/2010  . POLYARTHRALGIA 11/06/2008  . Raynaud's syndrome 08/20/2007   Past Surgical History:  Procedure Laterality Date  . TONSILLECTOMY     Social History   Socioeconomic History  . Marital status: Married    Spouse name: Not on file  . Number of children: 1  . Years of education: Not on file  . Highest education level: Not on file  Occupational History    Employer: [employer]  Social Needs  . Financial resource strain: Not on file  . Food insecurity:    Worry: Not on file    Inability: Not on file  . Transportation needs:    Medical: Not on file    Non-medical: Not on file  Tobacco Use  . Smoking status: Former Smoker    Last attempt to quit: 12/27/1990    Years since quitting: 28.3  . Smokeless tobacco: Never Used  Substance and Sexual Activity  . Alcohol use: Yes    Comment: one drink per day  . Drug use: No  . Sexual activity: Not on file  Lifestyle  . Physical activity:    Days per week: Not on file    Minutes per session: Not on file  . Stress: Not on file   Relationships  . Social connections:    Talks on phone: Not on file    Gets together: Not on file    Attends religious service: Not on file    Active member of club or organization: Not on file    Attends meetings of clubs or organizations: Not on file    Relationship status: Not on file  Other Topics Concern  . Not on file  Social History Narrative  . Not on file   No Known Allergies Family History  Problem Relation Age of Onset  . Osteoporosis Mother   . ADD / ADHD Daughter   . Osteoporosis Maternal Grandmother   . Breast cancer Maternal Grandmother   . Coronary artery disease Other   . Hypertension Other   . Prostate cancer Other   . Colon cancer Neg Hx      Current Outpatient Medications (Cardiovascular):  .  rosuvastatin (CRESTOR) 10 MG tablet, 1 tab by mouth every other day  Current Outpatient Medications (Respiratory):  .  albuterol (PROVENTIL HFA;VENTOLIN HFA) 108 (90 Base) MCG/ACT inhaler, Inhale 2 puffs into the lungs every 6 (six) hours as needed for wheezing or shortness of breath. .  fluticasone (FLONASE) 50 MCG/ACT nasal spray, Place 2 sprays into both nostrils daily.  Current Outpatient Medications (Analgesics):  .  ibuprofen (ADVIL,MOTRIN) 200  MG tablet, Take 400 mg by mouth every 6 (six) hours as needed for moderate pain. .  meloxicam (MOBIC) 15 MG tablet, Take 1 tablet (15 mg total) by mouth daily.   Current Outpatient Medications (Other):  Marland Kitchen  Diclofenac Sodium (PENNSAID) 2 % SOLN, Place 2 application onto the skin 2 (two) times daily. .  Estradiol (VAGIFEM) 10 MCG TABS vaginal tablet, Place 1 tablet (10 mcg total) vaginally 2 (two) times a week. .  gabapentin (NEURONTIN) 100 MG capsule, Take 2 capsules (200 mg total) by mouth at bedtime. Marland Kitchen  zolpidem (AMBIEN) 10 MG tablet, Take 1 tablet (10 mg total) by mouth at bedtime as needed. for sleep    Past medical history, social, surgical and family history all reviewed in electronic medical record.  No  pertanent information unless stated regarding to the chief complaint.   Review of Systems:  No headache, visual changes, nausea, vomiting, diarrhea, constipation, dizziness, abdominal pain, skin rash, fevers, chills, night sweats, weight loss, swollen lymph nodes, body aches, joint swelling, muscle aches, chest pain, shortness of breath, mood changes.   Objective  There were no vitals taken for this visit. Systems examined below as of    General: No apparent distress alert and oriented x3 mood and affect normal, dressed appropriately.  HEENT: Pupils equal, extraocular movements intact  Respiratory: Patient's speak in full sentences and does not appear short of breath  Cardiovascular: No lower extremity edema, non tender, no erythema  Skin: Warm dry intact with no signs of infection or rash on extremities or on axial skeleton.  Abdomen: Soft nontender  Neuro: Cranial nerves II through XII are intact, neurovascularly intact in all extremities with 2+ DTRs and 2+ pulses.  Lymph: No lymphadenopathy of posterior or anterior cervical chain or axillae bilaterally.  Gait normal with good balance and coordination.  MSK:  Non tender with full range of motion and good stability and symmetric strength and tone of shoulders, elbows, wrist, hip, knee and ankles bilaterally.     Impression and Recommendations:     This case required medical decision making of moderate complexity. The above documentation has been reviewed and is accurate and complete Lyndal Pulley, DO       Note: This dictation was prepared with Dragon dictation along with smaller phrase technology. Any transcriptional errors that result from this process are unintentional.

## 2019-04-19 ENCOUNTER — Ambulatory Visit: Payer: PRIVATE HEALTH INSURANCE | Admitting: Family Medicine

## 2019-04-20 ENCOUNTER — Other Ambulatory Visit: Payer: Self-pay | Admitting: Family Medicine

## 2019-04-26 ENCOUNTER — Other Ambulatory Visit: Payer: Self-pay

## 2019-04-26 ENCOUNTER — Ambulatory Visit (INDEPENDENT_AMBULATORY_CARE_PROVIDER_SITE_OTHER): Payer: PRIVATE HEALTH INSURANCE | Admitting: Family Medicine

## 2019-04-26 ENCOUNTER — Encounter: Payer: Self-pay | Admitting: Family Medicine

## 2019-04-26 VITALS — HR 60 | Ht 63.0 in | Wt 164.0 lb

## 2019-04-26 DIAGNOSIS — M533 Sacrococcygeal disorders, not elsewhere classified: Secondary | ICD-10-CM | POA: Diagnosis not present

## 2019-04-26 DIAGNOSIS — M999 Biomechanical lesion, unspecified: Secondary | ICD-10-CM | POA: Diagnosis not present

## 2019-04-26 MED ORDER — MELOXICAM 15 MG PO TABS: 15.0000 mg | ORAL_TABLET | Freq: Every day | ORAL | 3 refills | Status: DC

## 2019-04-26 MED ORDER — GABAPENTIN 100 MG PO CAPS: 200.0000 mg | ORAL_CAPSULE | Freq: Every day | ORAL | 3 refills | Status: DC

## 2019-04-26 NOTE — Patient Instructions (Signed)
Great to see you  You know the drill  The meds for 3 days at a time See if any association with eating  See me again in 5-6 weeks

## 2019-04-26 NOTE — Progress Notes (Signed)
[name] [address] [place], [place] [address] Phone: [phone] Subjective:   [name], am serving as a scribe for Dr. Hulan Saas.   CC: Neck and back pain follow-up  [id number]  Annette [name] is a 63 y.o. female coming in with complaint of neck pain. Is having left trap pain. Feels like her neck and upper back musculature are tight. Has been working from home. Does have a chair that she feels is not ergonomic but has tried to make adjustments to the chair.     Past Medical History:  Diagnosis Date  . ALLERGIC RHINITIS 10/01/2007  . ANXIETY 11/10/2010  . ATTENTION DEFICIT DISORDER 11/10/2010  . BARTHOLIN'S CYST 08/20/2007  . Carpal tunnel syndrome 08/20/2007  . COLONIC POLYPS, HX OF 08/20/2007  . DEPRESSION 08/20/2007  . EMPHYSEMA 06/16/2010  . HOT FLASHES 09/26/2009  . HYPERLIPIDEMIA 08/20/2007  . HYPERTENSION 08/20/2007  . Insomnia, unspecified 10/01/2007  . ISCHEMIC COLITIS, HX OF 10/01/2007  . MENOPAUSAL DISORDER 11/07/2009  . OBESITY, MILD 08/20/2007  . OSTEOPENIA 06/16/2010  . POLYARTHRALGIA 11/06/2008  . Raynaud's syndrome 08/20/2007   Past Surgical History:  Procedure Laterality Date  . TONSILLECTOMY     Social History   Socioeconomic History  . Marital status: Married    Spouse name: Not on file  . Number of children: 1  . Years of education: Not on file  . Highest education level: Not on file  Occupational History    Employer: [employer]  Social Needs  . Financial resource strain: Not on file  . Food insecurity:    Worry: Not on file    Inability: Not on file  . Transportation needs:    Medical: Not on file    Non-medical: Not on file  Tobacco Use  . Smoking status: Former Smoker    Last attempt to quit: 12/27/1990    Years since quitting: 28.3  . Smokeless tobacco: Never Used  Substance and Sexual Activity  . Alcohol use: Yes    Comment: one drink per day  . Drug use: No  . Sexual activity:  Not on file  Lifestyle  . Physical activity:    Days per week: Not on file    Minutes per session: Not on file  . Stress: Not on file  Relationships  . Social connections:    Talks on phone: Not on file    Gets together: Not on file    Attends religious service: Not on file    Active member of club or organization: Not on file    Attends meetings of clubs or organizations: Not on file    Relationship status: Not on file  Other Topics Concern  . Not on file  Social History Narrative  . Not on file   No Known Allergies Family History  Problem Relation Age of Onset  . Osteoporosis Mother   . ADD / ADHD Daughter   . Osteoporosis Maternal Grandmother   . Breast cancer Maternal Grandmother   . Coronary artery disease Other   . Hypertension Other   . Prostate cancer Other   . Colon cancer Neg Hx      Current Outpatient Medications (Cardiovascular):  .  rosuvastatin (CRESTOR) 10 MG tablet, 1 tab by mouth every other day  Current Outpatient Medications (Respiratory):  .  albuterol (PROVENTIL HFA;VENTOLIN HFA) 108 (90 Base) MCG/ACT inhaler, Inhale 2 puffs into the lungs every 6 (six) hours as needed for wheezing or  shortness of breath. .  fluticasone (FLONASE) 50 MCG/ACT nasal spray, Place 2 sprays into both nostrils daily.  Current Outpatient Medications (Analgesics):  .  ibuprofen (ADVIL,MOTRIN) 200 MG tablet, Take 400 mg by mouth every 6 (six) hours as needed for moderate pain. .  meloxicam (MOBIC) 15 MG tablet, Take 1 tablet (15 mg total) by mouth daily.   Current Outpatient Medications (Other):  Marland Kitchen  Diclofenac Sodium (PENNSAID) 2 % SOLN, Place 2 application onto the skin 2 (two) times daily. .  Estradiol (VAGIFEM) 10 MCG TABS vaginal tablet, Place 1 tablet (10 mcg total) vaginally 2 (two) times a week. .  gabapentin (NEURONTIN) 100 MG capsule, Take 2 capsules (200 mg total) by mouth at bedtime. Marland Kitchen  zolpidem (AMBIEN) 10 MG tablet, Take 1 tablet (10 mg total) by mouth at  bedtime as needed. for sleep    Past medical history, social, surgical and family history all reviewed in electronic medical record.  No pertanent information unless stated regarding to the chief complaint.   Review of Systems:  No headache, visual changes, nausea, vomiting, diarrhea, constipation, dizziness, abdominal pain, skin rash, fevers, chills, night sweats, weight loss, swollen lymph nodes, body aches, joint swelling,  chest pain, shortness of breath, mood changes.  Positive muscle aches  Objective  Pulse 60, height 5\' 3"  (1.6 m), weight 164 lb (74.4 kg), SpO2 98 %.   General: No apparent distress alert and oriented x3 mood and affect normal, dressed appropriately.  HEENT: Pupils equal, extraocular movements intact  Respiratory: Patient's speak in full sentences and does not appear short of breath  Cardiovascular: No lower extremity edema, non tender, no erythema  Skin: Warm dry intact with no signs of infection or rash on extremities or on axial skeleton.  Abdomen: Soft nontender  Neuro: Cranial nerves II through XII are intact, neurovascularly intact in all extremities with 2+ DTRs and 2+ pulses.  Lymph: No lymphadenopathy of posterior or anterior cervical chain or axillae bilaterally.  Gait normal with good balance and coordination.  MSK:  Non tender with full range of motion and good stability and symmetric strength and tone of shoulders, elbows, wrist, hip, knee and ankles bilaterally.  Increased tightness noted in the left trapezius.  Patient had multiple trigger points in this area.  Negative Spurling's test.  Tightness in the thoracolumbar junction as well noted.  Osteopathic findings C6 flexed rotated and side bent left T3 extended rotated and side bent left inhaled third rib T9 extended rotated and side bent left L2 flexed rotated and side bent right Sacrum right on right     Impression and Recommendations:     This case required medical decision making of  moderate complexity. The above documentation has been reviewed and is accurate and complete Lyndal Pulley, DO       Note: This dictation was prepared with Dragon dictation along with smaller phrase technology. Any transcriptional errors that result from this process are unintentional.

## 2019-04-26 NOTE — Assessment & Plan Note (Signed)
Stable overall.  Pain was more in the tightness of the neck.  Seem to be more trigger points in that area.  Discussed icing regimen and home exercise.  Discussed which activities to do which wants to avoid.  Patient is to increase activity slowly.  Follow-up 5 to 6 weeks

## 2019-04-26 NOTE — Assessment & Plan Note (Signed)
Decision today to treat with OMT was based on Physical Exam  After verbal consent patient was treated with HVLA, ME, FPR techniques in cervical, thoracic, rib lumbar and sacral areas  Patient tolerated the procedure well with improvement in symptoms  Patient given exercises, stretches and lifestyle modifications  See medications in patient instructions if given  Patient will follow up in 5-6 weeks

## 2019-05-23 ENCOUNTER — Telehealth: Payer: Self-pay

## 2019-05-23 NOTE — Telephone Encounter (Signed)
Called patient to reschedule appointment on 6/11. Left message to call back.

## 2019-06-07 ENCOUNTER — Ambulatory Visit: Payer: PRIVATE HEALTH INSURANCE | Admitting: Family Medicine

## 2019-06-12 ENCOUNTER — Other Ambulatory Visit: Payer: Self-pay

## 2019-06-12 ENCOUNTER — Ambulatory Visit (INDEPENDENT_AMBULATORY_CARE_PROVIDER_SITE_OTHER): Payer: PRIVATE HEALTH INSURANCE | Admitting: Family Medicine

## 2019-06-12 ENCOUNTER — Encounter: Payer: Self-pay | Admitting: Family Medicine

## 2019-06-12 VITALS — BP 110/60 | HR 62 | Ht 63.0 in | Wt 165.0 lb

## 2019-06-12 DIAGNOSIS — M999 Biomechanical lesion, unspecified: Secondary | ICD-10-CM | POA: Diagnosis not present

## 2019-06-12 DIAGNOSIS — M533 Sacrococcygeal disorders, not elsewhere classified: Secondary | ICD-10-CM | POA: Diagnosis not present

## 2019-06-12 MED ORDER — TIZANIDINE HCL 4 MG PO CAPS: ORAL_CAPSULE | ORAL | 0 refills | Status: DC

## 2019-06-12 NOTE — Assessment & Plan Note (Signed)
Decision today to treat with OMT was based on Physical Exam  After verbal consent patient was treated with HVLA, ME, FPR techniques in cervical, thoracic, rib, lumbar and sacral areas  Patient tolerated the procedure well with improvement in symptoms  Patient given exercises, stretches and lifestyle modifications  See medications in patient instructions if given  Patient will follow up in 4-6 weeks 

## 2019-06-12 NOTE — Assessment & Plan Note (Signed)
More concerning with the muscle imbalances.  Encourage more of the hip abductor strengthening.  Discussed which activities to do which wants to avoid.  Patient will increase activity slowly.  Follow-up again in 4 to 8 weeks

## 2019-06-12 NOTE — Patient Instructions (Addendum)
Good to see you.  Continue mexloicam for 5 days New meds today (zanaflex) See me again in 4 weeks

## 2019-06-12 NOTE — Progress Notes (Signed)
[name] [place], [place] [address] Phone: [phone] Subjective:   I Kandace Blitz am serving as a Education administrator for Dr. Hulan Saas.   CC: Neck and back pain follow-up  [id number]  Annette [name] is a 63 y.o. female coming in with complaint of neck and back pain. States that her shoulders and lower back is painful. Left knee is painful and would like to take a look.  Patient states that the knee seems to be painful again.  Has been persistently previously.  Nothing severe enough to stop her from activity but is unfortunately irritating.  Neck tightness.  Working at home.  Does not know she has the best ergonomics.  Denies radiation down the arm or any numbness or tingling.     Past Medical History:  Diagnosis Date  . ALLERGIC RHINITIS 10/01/2007  . ANXIETY 11/10/2010  . ATTENTION DEFICIT DISORDER 11/10/2010  . BARTHOLIN'S CYST 08/20/2007  . Carpal tunnel syndrome 08/20/2007  . COLONIC POLYPS, HX OF 08/20/2007  . DEPRESSION 08/20/2007  . EMPHYSEMA 06/16/2010  . HOT FLASHES 09/26/2009  . HYPERLIPIDEMIA 08/20/2007  . HYPERTENSION 08/20/2007  . Insomnia, unspecified 10/01/2007  . ISCHEMIC COLITIS, HX OF 10/01/2007  . MENOPAUSAL DISORDER 11/07/2009  . OBESITY, MILD 08/20/2007  . OSTEOPENIA 06/16/2010  . POLYARTHRALGIA 11/06/2008  . Raynaud's syndrome 08/20/2007   Past Surgical History:  Procedure Laterality Date  . TONSILLECTOMY     Social History   Socioeconomic History  . Marital status: Married    Spouse name: Not on file  . Number of children: 1  . Years of education: Not on file  . Highest education level: Not on file  Occupational History    Employer: [employer]  Social Needs  . Financial resource strain: Not on file  . Food insecurity    Worry: Not on file    Inability: Not on file  . Transportation needs    Medical: Not on file    Non-medical: Not on file  Tobacco Use  . Smoking status: Former Smoker   Quit date: 12/27/1990    Years since quitting: 28.4  . Smokeless tobacco: Never Used  Substance and Sexual Activity  . Alcohol use: Yes    Comment: one drink per day  . Drug use: No  . Sexual activity: Not on file  Lifestyle  . Physical activity    Days per week: Not on file    Minutes per session: Not on file  . Stress: Not on file  Relationships  . Social Herbalist on phone: Not on file    Gets together: Not on file    Attends religious service: Not on file    Active member of club or organization: Not on file    Attends meetings of clubs or organizations: Not on file    Relationship status: Not on file  Other Topics Concern  . Not on file  Social History Narrative  . Not on file   No Known Allergies Family History  Problem Relation Age of Onset  . Osteoporosis Mother   . ADD / ADHD Daughter   . Osteoporosis Maternal Grandmother   . Breast cancer Maternal Grandmother   . Coronary artery disease Other   . Hypertension Other   . Prostate cancer Other   . Colon cancer Neg Hx      Current Outpatient Medications (Cardiovascular):  .  rosuvastatin (CRESTOR) 10 MG tablet, 1 tab by mouth  every other day  Current Outpatient Medications (Respiratory):  .  albuterol (PROVENTIL HFA;VENTOLIN HFA) 108 (90 Base) MCG/ACT inhaler, Inhale 2 puffs into the lungs every 6 (six) hours as needed for wheezing or shortness of breath. .  fluticasone (FLONASE) 50 MCG/ACT nasal spray, Place 2 sprays into both nostrils daily.  Current Outpatient Medications (Analgesics):  .  ibuprofen (ADVIL,MOTRIN) 200 MG tablet, Take 400 mg by mouth every 6 (six) hours as needed for moderate pain. .  meloxicam (MOBIC) 15 MG tablet, Take 1 tablet (15 mg total) by mouth daily.   Current Outpatient Medications (Other):  Marland Kitchen  Diclofenac Sodium (PENNSAID) 2 % SOLN, Place 2 application onto the skin 2 (two) times daily. .  Estradiol (VAGIFEM) 10 MCG TABS vaginal tablet, Place 1 tablet (10 mcg total)  vaginally 2 (two) times a week. .  gabapentin (NEURONTIN) 100 MG capsule, Take 2 capsules (200 mg total) by mouth at bedtime. Marland Kitchen  zolpidem (AMBIEN) 10 MG tablet, Take 1 tablet (10 mg total) by mouth at bedtime as needed. for sleep .  tiZANidine (ZANAFLEX) 4 MG capsule, 1 tablet at night    Past medical history, social, surgical and family history all reviewed in electronic medical record.  No pertanent information unless stated regarding to the chief complaint.   Review of Systems:  No headache, visual changes, nausea, vomiting, diarrhea, constipation, dizziness, abdominal pain, skin rash, fevers, chills, night sweats, weight loss, swollen lymph nodes, body aches, joint swelling, muscle aches, chest pain, shortness of breath, mood changes.   Objective  Blood pressure 110/60, pulse 62, height 5\' 3"  (1.6 m), weight 165 lb (74.8 kg), SpO2 98 %.    General: No apparent distress alert and oriented x3 mood and affect normal, dressed appropriately.  HEENT: Pupils equal, extraocular movements intact  Respiratory: Patient's speak in full sentences and does not appear short of breath  Cardiovascular: No lower extremity edema, non tender, no erythema  Skin: Warm dry intact with no signs of infection or rash on extremities or on axial skeleton.  Abdomen: Soft nontender  Neuro: Cranial nerves II through XII are intact, neurovascularly intact in all extremities with 2+ DTRs and 2+ pulses.  Lymph: No lymphadenopathy of posterior or anterior cervical chain or axillae bilaterally.  Gait mild antalgic MSK:  Non tender with full range of motion and good stability and symmetric strength and tone of shoulders, elbows, wrist, hip, and ankles bilaterally.  Left knee exam shows more pain over the peds anserine area and tightness of the hamstring.  Negative McMurray's.  Mild pain with patellar grind. Neck: Inspection loss of lordosis. No palpable stepoffs. Negative Spurling's maneuver. Limited sidebending  bilaterally Grip strength and sensation normal in bilateral hands Strength good C4 to T1 distribution No sensory change to C4 to T1 Negative Hoffman sign bilaterally Reflexes normal Significant tightness in the trapezius bilaterally  Back Exam:  Inspection: Unremarkable  Motion: Flexion 40 deg, Extension 25 deg, Side Bending to 35 deg bilaterally,  Rotation to 35 deg bilaterally  SLR laying: Negative  XSLR laying: Negative  Palpable tenderness: Tender to palpation over the paraspinal musculature lumbar spine right greater than left. FABER: Tightness bilaterally. Sensory change: Gross sensation intact to all lumbar and sacral dermatomes.  Reflexes: 2+ at both patellar tendons, 2+ at achilles tendons, Babinski's downgoing.  Strength at foot  Plantar-flexion: 5/5 Dorsi-flexion: 5/5 Eversion: 5/5 Inversion: 5/5  Leg strength  Quad: 5/5 Hamstring: 5/5 Hip flexor: 5/5 Hip abductors: 5/5  Gait unremarkable.  Osteopathic findings C2  flexed rotated and side bent right C6 flexed rotated and side bent left T3 extended rotated and side bent right inhaled third rib T9 extended rotated and side bent left L2 flexed rotated and side bent right Sacrum right on right    Impression and Recommendations:     This case required medical decision making of moderate complexity. The above documentation has been reviewed and is accurate and complete Lyndal Pulley, DO       Note: This dictation was prepared with Dragon dictation along with smaller phrase technology. Any transcriptional errors that result from this process are unintentional.

## 2019-07-04 ENCOUNTER — Other Ambulatory Visit: Payer: Self-pay | Admitting: Family Medicine

## 2019-07-12 ENCOUNTER — Other Ambulatory Visit: Payer: Self-pay

## 2019-07-12 ENCOUNTER — Ambulatory Visit (INDEPENDENT_AMBULATORY_CARE_PROVIDER_SITE_OTHER): Payer: PRIVATE HEALTH INSURANCE | Admitting: Family Medicine

## 2019-07-12 ENCOUNTER — Encounter: Payer: Self-pay | Admitting: Family Medicine

## 2019-07-12 VITALS — BP 110/72 | HR 55 | Ht 63.0 in | Wt 167.0 lb

## 2019-07-12 DIAGNOSIS — M999 Biomechanical lesion, unspecified: Secondary | ICD-10-CM

## 2019-07-12 DIAGNOSIS — M533 Sacrococcygeal disorders, not elsewhere classified: Secondary | ICD-10-CM | POA: Diagnosis not present

## 2019-07-12 NOTE — Assessment & Plan Note (Signed)
Decision today to treat with OMT was based on Physical Exam  After verbal consent patient was treated with HVLA, ME, FPR techniques in cervical, thoracic, rib,  lumbar and sacral areas  Patient tolerated the procedure well with improvement in symptoms  Patient given exercises, stretches and lifestyle modifications  See medications in patient instructions if given  Patient will follow up in 4-8 weeks 

## 2019-07-12 NOTE — Assessment & Plan Note (Signed)
Doing relatively well over the course of time.  Minimal radicular symptoms.  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  Discussed more low impact exercises that might be more beneficial.  Follow-up again in 4 to 8 weeks

## 2019-07-12 NOTE — Patient Instructions (Signed)
Doing great! See me again in 6-8 weeks

## 2019-07-12 NOTE — Progress Notes (Signed)
[name] [place] [place], [place] [address] Phone: [phone] Subjective:   Annette [name], am serving as a scribe for Dr. Hulan Saas.  I'm seeing this patient by the request  of:    CC: Back pain follow-up  [id number]  Annette [name] is a 63 y.o. female coming in with complaint of back pain. Last seen on 06/12/2019. Patient states that she is doing well. She feels that coming in sooner is helping to decrease to her pain.Takes muscle relaxer at night prn.  Feels she has been doing fine.  Working on a little more of a regular basis.     Past Medical History:  Diagnosis Date  . ALLERGIC RHINITIS 10/01/2007  . ANXIETY 11/10/2010  . ATTENTION DEFICIT DISORDER 11/10/2010  . BARTHOLIN'S CYST 08/20/2007  . Carpal tunnel syndrome 08/20/2007  . COLONIC POLYPS, HX OF 08/20/2007  . DEPRESSION 08/20/2007  . EMPHYSEMA 06/16/2010  . HOT FLASHES 09/26/2009  . HYPERLIPIDEMIA 08/20/2007  . HYPERTENSION 08/20/2007  . Insomnia, unspecified 10/01/2007  . ISCHEMIC COLITIS, HX OF 10/01/2007  . MENOPAUSAL DISORDER 11/07/2009  . OBESITY, MILD 08/20/2007  . OSTEOPENIA 06/16/2010  . POLYARTHRALGIA 11/06/2008  . Raynaud's syndrome 08/20/2007   Past Surgical History:  Procedure Laterality Date  . TONSILLECTOMY     Social History   Socioeconomic History  . Marital status: Married    Spouse name: Not on file  . Number of children: 1  . Years of education: Not on file  . Highest education level: Not on file  Occupational History    Employer: [employer]  Social Needs  . Financial resource strain: Not on file  . Food insecurity    Worry: Not on file    Inability: Not on file  . Transportation needs    Medical: Not on file    Non-medical: Not on file  Tobacco Use  . Smoking status: Former Smoker    Quit date: 12/27/1990    Years since quitting: 28.5  . Smokeless tobacco: Never Used  Substance and Sexual Activity  . Alcohol use: Yes   Comment: one drink per day  . Drug use: [name]  . Sexual activity: Not on file  Lifestyle  . Physical activity    Days per week: Not on file    Minutes per session: Not on file  . Stress: Not on file  Relationships  . Social Herbalist on phone: Not on file    Gets together: Not on file    Attends religious service: Not on file    Active member of club or organization: Not on file    Attends meetings of clubs or organizations: Not on file    Relationship status: Not on file  Other Topics Concern  . Not on file  Social History Narrative  . Not on file   [name] Known Allergies Family History  Problem Relation Age of Onset  . Osteoporosis Mother   . ADD / ADHD Daughter   . Osteoporosis Maternal Grandmother   . Breast cancer Maternal Grandmother   . Coronary artery disease Other   . Hypertension Other   . Prostate cancer Other   . Colon cancer Neg Hx      Current Outpatient Medications (Cardiovascular):  .  rosuvastatin (CRESTOR) 10 MG tablet, 1 tab by mouth every other day  Current Outpatient Medications (Respiratory):  .  albuterol (PROVENTIL HFA;VENTOLIN HFA) 108 (90 Base) MCG/ACT inhaler, Inhale 2 puffs  into the lungs every 6 (six) hours as needed for wheezing or shortness of breath. .  fluticasone (FLONASE) 50 MCG/ACT nasal spray, Place 2 sprays into both nostrils daily.  Current Outpatient Medications (Analgesics):  .  ibuprofen (ADVIL,MOTRIN) 200 MG tablet, Take 400 mg by mouth every 6 (six) hours as needed for moderate pain. .  meloxicam (MOBIC) 15 MG tablet, Take 1 tablet (15 mg total) by mouth daily.   Current Outpatient Medications (Other):  Marland Kitchen  Diclofenac Sodium (PENNSAID) 2 % SOLN, Place 2 application onto the skin 2 (two) times daily. .  Estradiol (VAGIFEM) 10 MCG TABS vaginal tablet, Place 1 tablet (10 mcg total) vaginally 2 (two) times a week. .  gabapentin (NEURONTIN) 100 MG capsule, Take 2 capsules (200 mg total) by mouth at bedtime. Marland Kitchen  tiZANidine  (ZANAFLEX) 4 MG capsule, TAKE 1 CAPSULE BY MOUTH EVERY DAY IN THE EVENING .  zolpidem (AMBIEN) 10 MG tablet, Take 1 tablet (10 mg total) by mouth at bedtime as needed. for sleep    Past medical history, social, surgical and family history all reviewed in electronic medical record.  [name] pertanent information unless stated regarding to the chief complaint.   Review of Systems:  [name] headache, visual changes, nausea, vomiting, diarrhea, constipation, dizziness, abdominal pain, skin rash, fevers, chills, night sweats, weight loss, swollen lymph nodes, body aches, joint swelling, muscle aches, chest pain, shortness of breath, mood changes.   Objective  Blood pressure 110/72, pulse (!) 55, height 5\' 3"  (1.6 m), weight 167 lb (75.8 kg).   General: [name] apparent distress alert and oriented x3 mood and affect normal, dressed appropriately.  HEENT: Pupils equal, extraocular movements intact  Respiratory: Patient's speak in full sentences and does not appear short of breath  Cardiovascular: [name] lower extremity edema, non tender, [name] erythema  Skin: Warm dry intact with [name] signs of infection or rash on extremities or on axial skeleton.  Abdomen: Soft nontender  Neuro: Cranial nerves II through XII are intact, neurovascularly intact in all extremities with 2+ DTRs and 2+ pulses.  Lymph: [name] lymphadenopathy of posterior or anterior cervical chain or axillae bilaterally.  Gait normal with good balance and coordination.  MSK:  Non tender with full range of motion and good stability and symmetric strength and tone of shoulders, elbows, wrist, hip, knee and ankles bilaterally.  Back Exam:  Inspection: Mild loss of lordosis Motion: Flexion 45 deg, Extension 15 deg, Side Bending to 30 deg bilaterally,  Rotation to 45 deg bilaterally  SLR laying: Negative  XSLR laying: Negative  Palpable tenderness: Tender to palpation of the thoracolumbar and lumbosacral junctions bilaterally.  Mild scapular medial border pain as  well.Marland Kitchen FABER: negative. Sensory change: Gross sensation intact to all lumbar and sacral dermatomes.  Reflexes: 2+ at both patellar tendons, 2+ at achilles tendons, Babinski's downgoing.  Strength at foot  Plantar-flexion: 5/5 Dorsi-flexion: 5/5 Eversion: 5/5 Inversion: 5/5  Leg strength  Quad: 5/5 Hamstring: 5/5 Hip flexor: 5/5 Hip abductors: 5/5  Gait unremarkable.  Osteopathic findings  C6 flexed rotated and side bent left T3 extended rotated and side bent right inhaled third rib T7 extended rotated and side bent left L2 flexed rotated and side bent right Sacrum right on right    Impression and Recommendations:     This case required medical decision making of moderate complexity. The above documentation has been reviewed and is accurate and complete Lyndal Pulley, DO       Note: This dictation was prepared with  Dragon dictation along with smaller Company secretary. Any transcriptional errors that result from this process are unintentional.

## 2019-07-23 ENCOUNTER — Other Ambulatory Visit (INDEPENDENT_AMBULATORY_CARE_PROVIDER_SITE_OTHER): Payer: PRIVATE HEALTH INSURANCE

## 2019-07-23 DIAGNOSIS — E785 Hyperlipidemia, unspecified: Secondary | ICD-10-CM | POA: Diagnosis not present

## 2019-07-23 LAB — LIPID PANEL
Cholesterol: 163 mg/dL (ref 0–200)
HDL: 62.8 mg/dL (ref >39.00)
LDL Cholesterol: 84 mg/dL (ref 0–99)
NonHDL: 99.75
Total CHOL/HDL Ratio: 3
Triglycerides: 79 mg/dL (ref 0.0–149.0)
VLDL: 15.8 mg/dL (ref 0.0–40.0)

## 2019-07-23 LAB — HEPATIC FUNCTION PANEL
ALT: 19 U/L (ref 0–35)
AST: 26 U/L (ref 0–37)
Albumin: 4.2 g/dL (ref 3.5–5.2)
Alkaline Phosphatase: 61 U/L (ref 39–117)
Bilirubin, Direct: 0.1 mg/dL (ref 0.0–0.3)
Total Bilirubin: 0.5 mg/dL (ref 0.2–1.2)
Total Protein: 7 g/dL (ref 6.0–8.3)

## 2019-07-25 ENCOUNTER — Ambulatory Visit (INDEPENDENT_AMBULATORY_CARE_PROVIDER_SITE_OTHER): Payer: PRIVATE HEALTH INSURANCE | Admitting: Internal Medicine

## 2019-07-25 ENCOUNTER — Encounter: Payer: Self-pay | Admitting: Internal Medicine

## 2019-07-25 ENCOUNTER — Other Ambulatory Visit: Payer: Self-pay

## 2019-07-25 VITALS — BP 118/74 | HR 64 | Temp 98.5°F | Ht 63.0 in | Wt 164.0 lb

## 2019-07-25 DIAGNOSIS — E611 Iron deficiency: Secondary | ICD-10-CM

## 2019-07-25 DIAGNOSIS — M25561 Pain in right knee: Secondary | ICD-10-CM | POA: Diagnosis not present

## 2019-07-25 DIAGNOSIS — Z0000 Encounter for general adult medical examination without abnormal findings: Secondary | ICD-10-CM

## 2019-07-25 DIAGNOSIS — R739 Hyperglycemia, unspecified: Secondary | ICD-10-CM

## 2019-07-25 DIAGNOSIS — Z20828 Contact with and (suspected) exposure to other viral communicable diseases: Secondary | ICD-10-CM

## 2019-07-25 DIAGNOSIS — Z20822 Contact with and (suspected) exposure to covid-19: Secondary | ICD-10-CM | POA: Insufficient documentation

## 2019-07-25 DIAGNOSIS — E538 Deficiency of other specified B group vitamins: Secondary | ICD-10-CM

## 2019-07-25 DIAGNOSIS — L989 Disorder of the skin and subcutaneous tissue, unspecified: Secondary | ICD-10-CM | POA: Diagnosis not present

## 2019-07-25 DIAGNOSIS — E785 Hyperlipidemia, unspecified: Secondary | ICD-10-CM

## 2019-07-25 DIAGNOSIS — M25562 Pain in left knee: Secondary | ICD-10-CM

## 2019-07-25 DIAGNOSIS — E559 Vitamin D deficiency, unspecified: Secondary | ICD-10-CM

## 2019-07-25 MED ORDER — ROSUVASTATIN CALCIUM 10 MG PO TABS: ORAL_TABLET | ORAL | 3 refills | Status: DC

## 2019-07-25 NOTE — Progress Notes (Signed)
Subjective:    Patient ID: Annette [name], female    DOB: November 02, 1956, 63 y.o.   MRN: [id number]  HPI  Here to f/u; overall doing ok,  Pt denies chest pain, increasing sob or doe, wheezing, orthopnea, PND, increased LE swelling, palpitations, dizziness or syncope.  Pt denies new neurological symptoms such as facial or extremity weakness or numbness.  Pt denies polydipsia, polyuria, or low sugar episode.  Pt states overall good compliance with meds, mostly trying to follow appropriate diet, with wt overall stable,  but little exercise however. Also with HA and fatigue, feels somewhat ill but no other overt URI symptoms or cough or sob but concerned about COVID as 2 co workers were positive.  Also has a skin lesion to the right mid back enlargaing, asks for derm referral.  Also has numerous varicosities small to bilat legs without pain except for several months of ongoing post knee pain with swelling fullness like feeling but not sure she feels a lump, worse to sit for longer periods of time, then really feels it with standing up. Past Medical History:  Diagnosis Date  . ALLERGIC RHINITIS 10/01/2007  . ANXIETY 11/10/2010  . ATTENTION DEFICIT DISORDER 11/10/2010  . BARTHOLIN'S CYST 08/20/2007  . Carpal tunnel syndrome 08/20/2007  . COLONIC POLYPS, HX OF 08/20/2007  . DEPRESSION 08/20/2007  . EMPHYSEMA 06/16/2010  . HOT FLASHES 09/26/2009  . HYPERLIPIDEMIA 08/20/2007  . HYPERTENSION 08/20/2007  . Insomnia, unspecified 10/01/2007  . ISCHEMIC COLITIS, HX OF 10/01/2007  . MENOPAUSAL DISORDER 11/07/2009  . OBESITY, MILD 08/20/2007  . OSTEOPENIA 06/16/2010  . POLYARTHRALGIA 11/06/2008  . Raynaud's syndrome 08/20/2007   Past Surgical History:  Procedure Laterality Date  . TONSILLECTOMY      reports that she quit smoking about 28 years ago. She has never used smokeless tobacco. She reports current alcohol use. She reports that she does not use drugs. family history includes ADD / ADHD in her daughter; Breast  cancer in her maternal grandmother; Coronary artery disease in an other family member; Hypertension in an other family member; Osteoporosis in her maternal grandmother and mother; Prostate cancer in an other family member. No Known Allergies Current Outpatient Medications on File Prior to Visit  Medication Sig Dispense Refill  . albuterol (PROVENTIL HFA;VENTOLIN HFA) 108 (90 Base) MCG/ACT inhaler Inhale 2 puffs into the lungs every 6 (six) hours as needed for wheezing or shortness of breath. 1 Inhaler 2  . Diclofenac Sodium (PENNSAID) 2 % SOLN Place 2 application onto the skin 2 (two) times daily. 112 g 3  . Estradiol (VAGIFEM) 10 MCG TABS vaginal tablet Place 1 tablet (10 mcg total) vaginally 2 (two) times a week. 24 tablet 3  . fluticasone (FLONASE) 50 MCG/ACT nasal spray Place 2 sprays into both nostrils daily. 16 g 6  . gabapentin (NEURONTIN) 100 MG capsule Take 2 capsules (200 mg total) by mouth at bedtime. 180 capsule 3  . ibuprofen (ADVIL,MOTRIN) 200 MG tablet Take 400 mg by mouth every 6 (six) hours as needed for moderate pain.    . meloxicam (MOBIC) 15 MG tablet Take 1 tablet (15 mg total) by mouth daily. 90 tablet 3  . tiZANidine (ZANAFLEX) 4 MG capsule TAKE 1 CAPSULE BY MOUTH EVERY DAY IN THE EVENING 30 capsule 0  . zolpidem (AMBIEN) 10 MG tablet Take 1 tablet (10 mg total) by mouth at bedtime as needed. for sleep 90 tablet 1   No current facility-administered medications on file prior to visit.  Review of Systems  Constitutional: Negative for other unusual diaphoresis or sweats HENT: Negative for ear discharge or swelling Eyes: Negative for other worsening visual disturbances Respiratory: Negative for stridor or other swelling  Gastrointestinal: Negative for worsening distension or other blood Genitourinary: Negative for retention or other urinary change Musculoskeletal: Negative for other MSK pain or swelling Skin: Negative for color change or other new lesions Neurological:  Negative for worsening tremors and other numbness  Psychiatric/Behavioral: Negative for worsening agitation or other fatigue All other system neg per pt    Objective:   Physical Exam BP 118/74   Pulse 64   Temp 98.5 F (36.9 C) (Oral)   Ht 5\' 3"  (1.6 m)   Wt 164 lb (74.4 kg)   SpO2 98%   BMI 29.05 kg/m  VS noted, not ill appearing Constitutional: Pt appears in NAD HENT: Head: NCAT.  Right Ear: External ear normal.  Left Ear: External ear normal.  Eyes: . Pupils are equal, round, and reactive to light. Conjunctivae and EOM are normal Nose: without d/c or deformity Neck: Neck supple. Gross normal ROM Cardiovascular: Normal rate and regular rhythm.   Pulmonary/Chest: Effort normal and breath sounds without rales or wheezing.  Abd:  Soft, NT, ND, + BS, no organomegaly bilat knees without effusions, may have mild tenderness to tendon insertions sites, no overt bakers cyst Neurological: Pt is alert. At baseline orientation, motor grossly intact Skin: Skin is warm. No rashes, other new lesions, no LE edema Psychiatric: Pt behavior is normal without agitation  No other exam findings Lab Results  Component Value Date   WBC 6.5 01/19/2019   HGB 13.6 01/19/2019   HCT 41.6 01/19/2019   PLT 241.0 01/19/2019   GLUCOSE 97 01/19/2019   CHOL 163 07/23/2019   TRIG 79.0 07/23/2019   HDL 62.80 07/23/2019   LDLDIRECT 137.0 12/11/2013   LDLCALC 84 07/23/2019   ALT 19 07/23/2019   AST 26 07/23/2019   NA 138 01/19/2019   K 4.6 01/19/2019   CL 105 01/19/2019   CREATININE 1.04 01/19/2019   BUN 20 01/19/2019   CO2 26 01/19/2019   TSH 1.93 01/19/2019           Assessment & Plan:

## 2019-07-25 NOTE — Assessment & Plan Note (Signed)
[place] for sport med referral

## 2019-07-25 NOTE — Assessment & Plan Note (Signed)
[place] for [phone]

## 2019-07-25 NOTE — Assessment & Plan Note (Signed)
stable overall by history and exam, recent data reviewed with pt, and pt to continue medical treatment as before,  to f/u any worsening symptoms or concerns  

## 2019-07-25 NOTE — Assessment & Plan Note (Signed)
Ok for derm referral 

## 2019-07-25 NOTE — Assessment & Plan Note (Addendum)
Improved, now stable,  to f/u any worsening symptoms or concerns  Note:  Total time for pt hx, exam, review of record with pt in the room, determination of diagnoses and plan for further eval and tx is > 40 min, with over 50% spent in coordination and counseling of patient including the differential dx, tx, further evaluation and other management of HLD, hyperglycemia, bilat knee pain, skin lesion, exposure to covid

## 2019-07-25 NOTE — Patient Instructions (Signed)
Please continue all other medications as before, including the statin every other day  Please have the pharmacy call with any other refills you may need.  Please continue your efforts at being more active, low cholesterol diet, and weight control.  Please keep your appointments with your specialists as you may have planned  You will be contacted regarding the referral for: Dermatology, and Dr Tamala Julian for the knees  Please go tomorrow for the COVID testing at the old [facility] site ([address])  Please return in 6 months, or sooner if needed, with Lab testing done 3-5 days before

## 2019-07-30 ENCOUNTER — Other Ambulatory Visit: Payer: Self-pay | Admitting: Family Medicine

## 2019-08-23 ENCOUNTER — Ambulatory Visit: Payer: PRIVATE HEALTH INSURANCE | Admitting: Family Medicine

## 2019-08-28 ENCOUNTER — Other Ambulatory Visit: Payer: Self-pay | Admitting: Family Medicine

## 2019-08-28 ENCOUNTER — Other Ambulatory Visit: Payer: Self-pay

## 2019-08-28 ENCOUNTER — Encounter: Payer: Self-pay | Admitting: Family Medicine

## 2019-08-28 ENCOUNTER — Ambulatory Visit (INDEPENDENT_AMBULATORY_CARE_PROVIDER_SITE_OTHER): Payer: PRIVATE HEALTH INSURANCE | Admitting: Family Medicine

## 2019-08-28 VITALS — BP 122/72 | HR 57 | Ht 63.0 in | Wt 163.0 lb

## 2019-08-28 DIAGNOSIS — M999 Biomechanical lesion, unspecified: Secondary | ICD-10-CM

## 2019-08-28 DIAGNOSIS — M533 Sacrococcygeal disorders, not elsewhere classified: Secondary | ICD-10-CM

## 2019-08-28 NOTE — Assessment & Plan Note (Signed)
Decision today to treat with OMT was based on Physical Exam  After verbal consent patient was treated with HVLA, ME, FPR techniques in cervical, thoracic, rib, lumbar and sacral areas  Patient tolerated the procedure well with improvement in symptoms  Patient given exercises, stretches and lifestyle modifications  See medications in patient instructions if given  Patient will follow up in 6-12 weeks

## 2019-08-28 NOTE — Progress Notes (Signed)
[name] [address] [place], [place] [address] Phone: [phone] Subjective:   Annette [name], am serving as a scribe for Dr. Hulan Saas.    CC: Back and neck pain follow-up  [id number]  Annette [name] is a 63 y.o. female coming in with complaint of back pain. Patient is doing well since last visit. Is having intermittent left shoulder pain in the trap muscle. Attributes pain to tightness.  Overall the patient seems to be doing relatively well.  Patient states minimal stress urinary but nothing severe.  Continues to stay active and try to workout on a regular basis     Past Medical History:  Diagnosis Date  . ALLERGIC RHINITIS 10/01/2007  . ANXIETY 11/10/2010  . ATTENTION DEFICIT DISORDER 11/10/2010  . BARTHOLIN'S CYST 08/20/2007  . Carpal tunnel syndrome 08/20/2007  . COLONIC POLYPS, HX OF 08/20/2007  . DEPRESSION 08/20/2007  . EMPHYSEMA 06/16/2010  . HOT FLASHES 09/26/2009  . HYPERLIPIDEMIA 08/20/2007  . HYPERTENSION 08/20/2007  . Insomnia, unspecified 10/01/2007  . ISCHEMIC COLITIS, HX OF 10/01/2007  . MENOPAUSAL DISORDER 11/07/2009  . OBESITY, MILD 08/20/2007  . OSTEOPENIA 06/16/2010  . POLYARTHRALGIA 11/06/2008  . Raynaud's syndrome 08/20/2007   Past Surgical History:  Procedure Laterality Date  . TONSILLECTOMY     Social History   Socioeconomic History  . Marital status: Married    Spouse name: Not on file  . Number of children: 1  . Years of education: Not on file  . Highest education level: Not on file  Occupational History    Employer: [employer]  Social Needs  . Financial resource strain: Not on file  . Food insecurity    Worry: Not on file    Inability: Not on file  . Transportation needs    Medical: Not on file    Non-medical: Not on file  Tobacco Use  . Smoking status: Former Smoker    Quit date: 12/27/1990    Years since quitting: 28.6  . Smokeless tobacco: Never Used  Substance and Sexual Activity   . Alcohol use: Yes    Comment: one drink per day  . Drug use: [name]  . Sexual activity: Not on file  Lifestyle  . Physical activity    Days per week: Not on file    Minutes per session: Not on file  . Stress: Not on file  Relationships  . Social Herbalist on phone: Not on file    Gets together: Not on file    Attends religious service: Not on file    Active member of club or organization: Not on file    Attends meetings of clubs or organizations: Not on file    Relationship status: Not on file  Other Topics Concern  . Not on file  Social History Narrative  . Not on file   [name] Known Allergies Family History  Problem Relation Age of Onset  . Osteoporosis Mother   . ADD / ADHD Daughter   . Osteoporosis Maternal Grandmother   . Breast cancer Maternal Grandmother   . Coronary artery disease Other   . Hypertension Other   . Prostate cancer Other   . Colon cancer Neg Hx      Current Outpatient Medications (Cardiovascular):  .  rosuvastatin (CRESTOR) 10 MG tablet, 1 tab by mouth every other day  Current Outpatient Medications (Respiratory):  .  albuterol (PROVENTIL HFA;VENTOLIN HFA) 108 (90 Base) MCG/ACT inhaler, Inhale 2  puffs into the lungs every 6 (six) hours as needed for wheezing or shortness of breath. .  fluticasone (FLONASE) 50 MCG/ACT nasal spray, Place 2 sprays into both nostrils daily.  Current Outpatient Medications (Analgesics):  .  ibuprofen (ADVIL,MOTRIN) 200 MG tablet, Take 400 mg by mouth every 6 (six) hours as needed for moderate pain. .  meloxicam (MOBIC) 15 MG tablet, Take 1 tablet (15 mg total) by mouth daily.   Current Outpatient Medications (Other):  Marland Kitchen  Diclofenac Sodium (PENNSAID) 2 % SOLN, Place 2 application onto the skin 2 (two) times daily. .  Estradiol (VAGIFEM) 10 MCG TABS vaginal tablet, Place 1 tablet (10 mcg total) vaginally 2 (two) times a week. .  gabapentin (NEURONTIN) 100 MG capsule, Take 2 capsules (200 mg total) by mouth at  bedtime. Marland Kitchen  tiZANidine (ZANAFLEX) 4 MG capsule, TAKE 1 CAPSULE BY MOUTH EVERY EVENING .  zolpidem (AMBIEN) 10 MG tablet, Take 1 tablet (10 mg total) by mouth at bedtime as needed. for sleep    Past medical history, social, surgical and family history all reviewed in electronic medical record.  [name] pertanent information unless stated regarding to the chief complaint.   Review of Systems:  [name] headache, visual changes, nausea, vomiting, diarrhea, constipation, dizziness, abdominal pain, skin rash, fevers, chills, night sweats, weight loss, swollen lymph nodes, body aches, joint swelling, muscle aches, chest pain, shortness of breath, mood changes.   Objective  Blood pressure 122/72, pulse (!) 57, height 5\' 3"  (1.6 m), weight 163 lb (73.9 kg), SpO2 98 %.   General: [name] apparent distress alert and oriented x3 mood and affect normal, dressed appropriately.  HEENT: Pupils equal, extraocular movements intact  Respiratory: Patient's speak in full sentences and does not appear short of breath  Cardiovascular: [name] lower extremity edema, non tender, [name] erythema  Skin: Warm dry intact with [name] signs of infection or rash on extremities or on axial skeleton.  Abdomen: Soft nontender  Neuro: Cranial nerves II through XII are intact, neurovascularly intact in all extremities with 2+ DTRs and 2+ pulses.  Lymph: [name] lymphadenopathy of posterior or anterior cervical chain or axillae bilaterally.  Gait normal with good balance and coordination.  MSK:  Non tender with full range of motion and good stability and symmetric strength and tone of shoulders, elbows, wrist, hip, knee and ankles bilaterally.  Back Exam:  Inspection: Unremarkable  Motion: Flexion 45 deg, Extension 45 deg, Side Bending to 45 deg bilaterally,  Rotation to 45 deg bilaterally  SLR laying: Negative  XSLR laying: Negative  Palpable tenderness: None. FABER: negative. Sensory change: Gross sensation intact to all lumbar and sacral dermatomes.   Reflexes: 2+ at both patellar tendons, 2+ at achilles tendons, Babinski's downgoing.  Strength at foot  Plantar-flexion: 5/5 Dorsi-flexion: 5/5 Eversion: 5/5 Inversion: 5/5  Leg strength  Quad: 5/5 Hamstring: 5/5 Hip flexor: 5/5 Hip abductors: 5/5  Gait unremarkable.  Osteopathic findings C3 flexed rotated and side bent right C6 flexed rotated and side bent left T3 extended rotated and side bent left inhaled third rib T7 extended rotated and side bent left L4 flexed rotated and side bent right Sacrum right on right    Impression and Recommendations:     This case required medical decision making of moderate complexity. The above documentation has been reviewed and is accurate and complete Lyndal Pulley, DO       Note: This dictation was prepared with Dragon dictation along with smaller phrase technology. Any transcriptional errors that result  from this process are unintentional.

## 2019-08-28 NOTE — Patient Instructions (Signed)
Doing great See me again in 6 weeks 

## 2019-08-28 NOTE — Assessment & Plan Note (Signed)
Stable overall.  Discussed posture and ergonomics, discussed which activities to do which wants to avoid.  Patient is to increase activity slowly over the course of next several weeks.  Responded well to manipulation.  Follow-up again in 6 to 12 weeks

## 2019-09-05 ENCOUNTER — Ambulatory Visit: Payer: PRIVATE HEALTH INSURANCE | Admitting: Family Medicine

## 2019-09-06 ENCOUNTER — Encounter: Payer: Self-pay | Admitting: Internal Medicine

## 2019-09-14 ENCOUNTER — Other Ambulatory Visit: Payer: Self-pay | Admitting: Family Medicine

## 2019-10-06 IMAGING — CT CT HEART SCORING
2 series · 16 of 20 positions shown, 18 images · non-contrast
Comparison: None.
COMPARISON: None.

Addendum:
EXAM:
OVER-READ INTERPRETATION  CT CHEST

The following report is an over-read performed by radiologist Dr.
Knud-Erik Sund [[email or web address]] on 03/14/2019. This
over-read does not include interpretation of cardiac or coronary
anatomy or pathology. The coronary calcium score/coronary CTA
interpretation by the cardiologist is attached.
CLINICAL DATA: Risk stratification
Coronary Calcium Score
TECHNIQUE: The patient was scanned on a Siemens Force scanner. Axial
non-contrast 3 mm slices were carried out through the heart. The
data set was analyzed on a dedicated work station and scored using
the Agatson method.

[Series 3: casc 3.0 i36f 2 bestdiast 70 % · axial · 0.28mm/px · z∈[-182,-86]mm · 8 of 42 slices shown, 10 images]
[im 5/42  vessel]
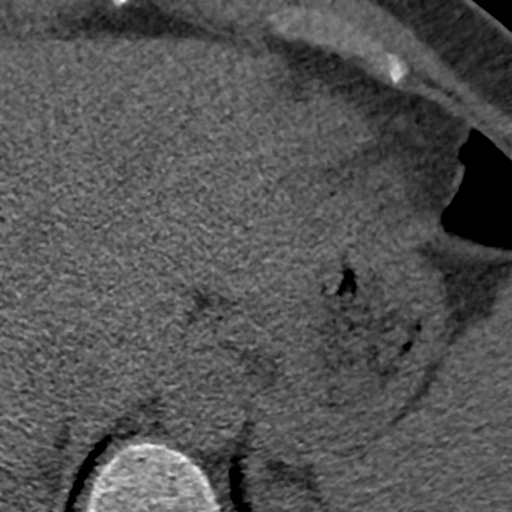
[im 5/42  lung]
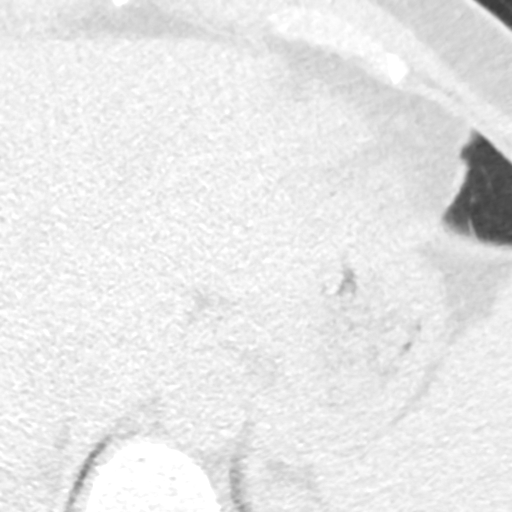
[im 10/42  vessel]
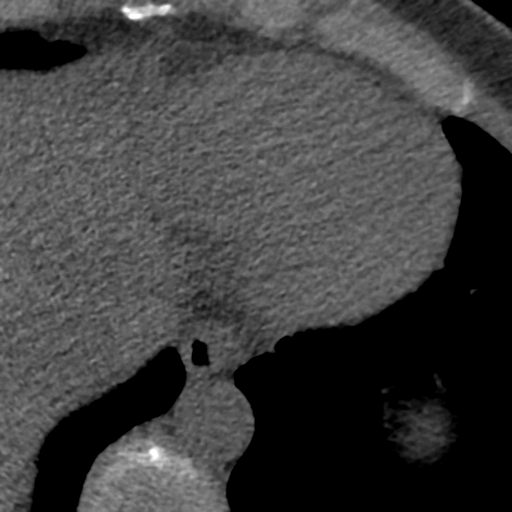
[im 14/42  vessel]
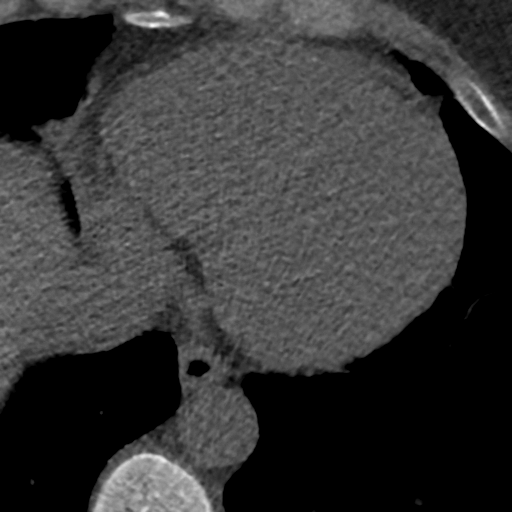
[im 19/42  vessel]
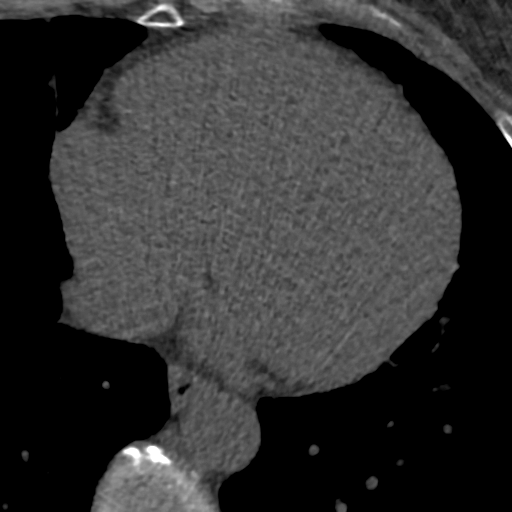
[im 23/42  vessel]
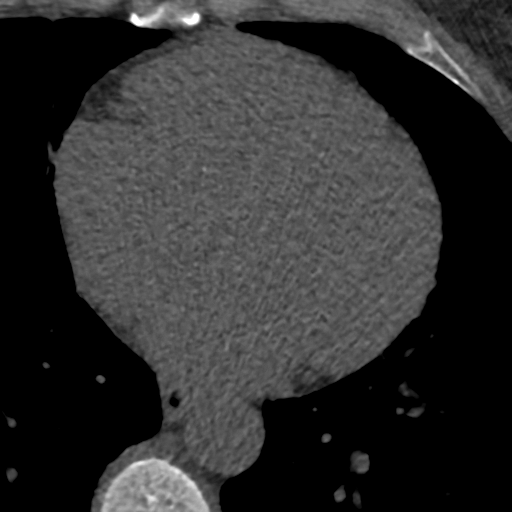
[im 23/42  lung]
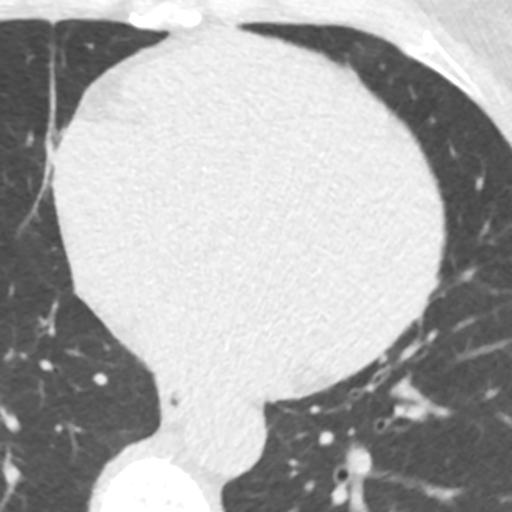
[im 28/42  vessel]
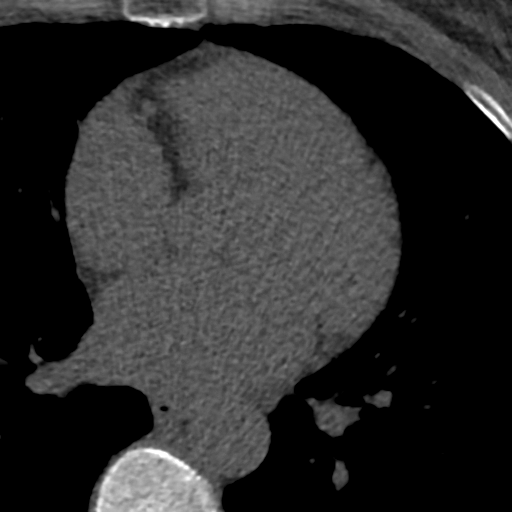
[im 32/42  vessel]
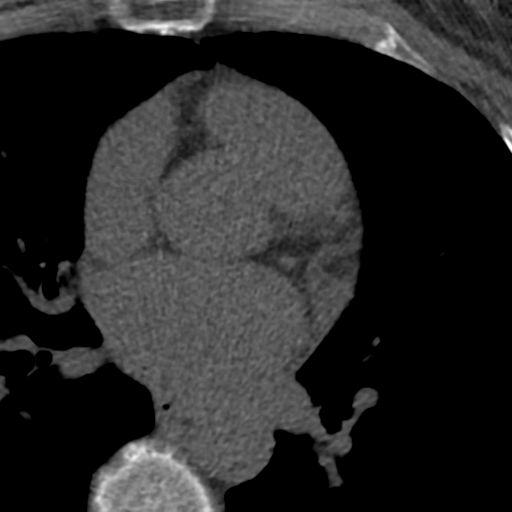
[im 37/42  vessel]
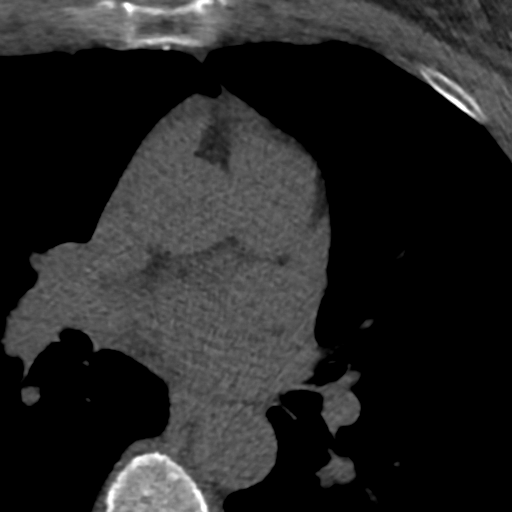

[Series 5: lung st 70 % · axial · 0.58mm/px · z∈[-182,-86]mm · 8 of 42 slices shown]
[im 5/42  lung]
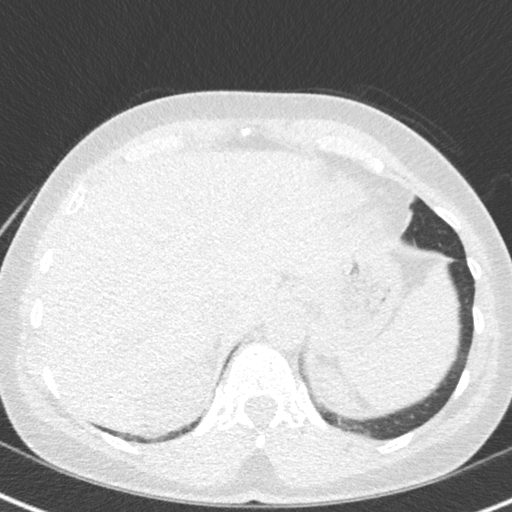
[im 10/42  lung]
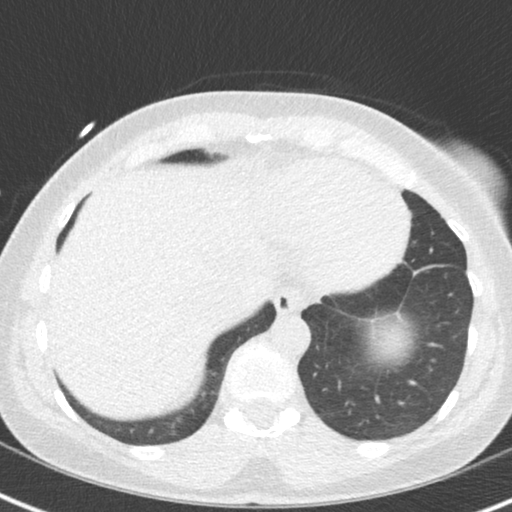
[im 14/42  lung]
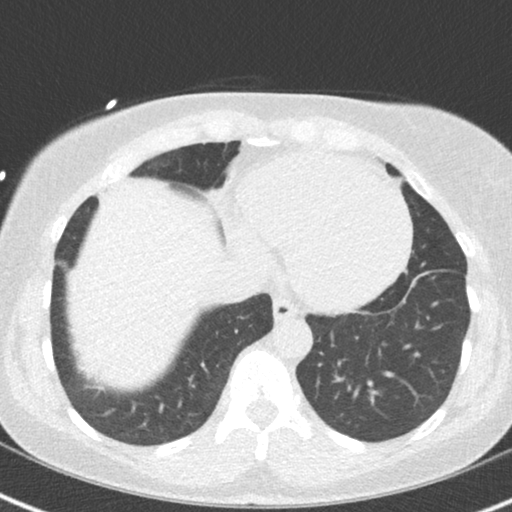
[im 19/42  lung]
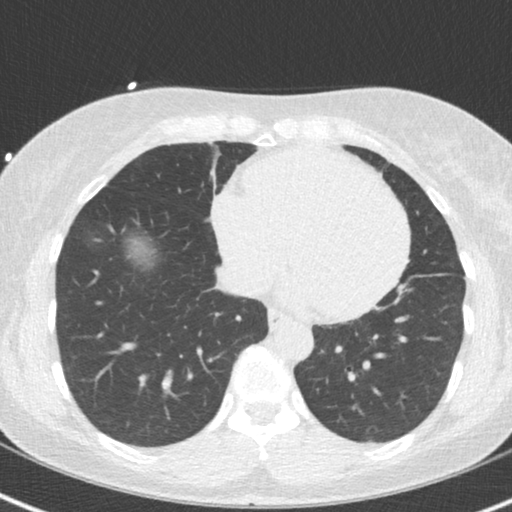
[im 23/42  lung]
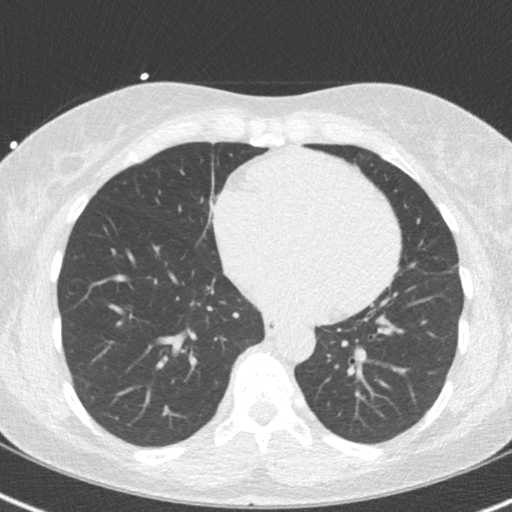
[im 28/42  lung]
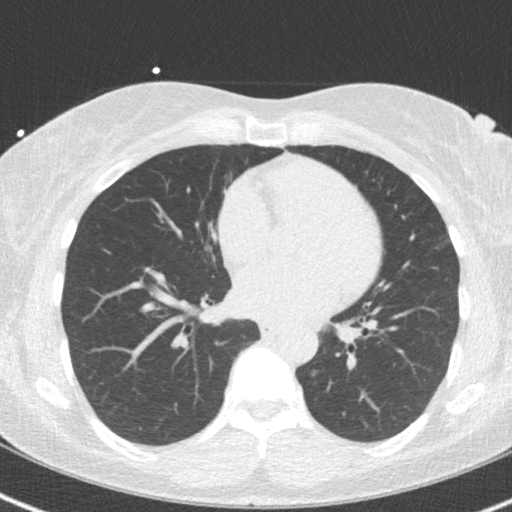
[im 32/42  lung]
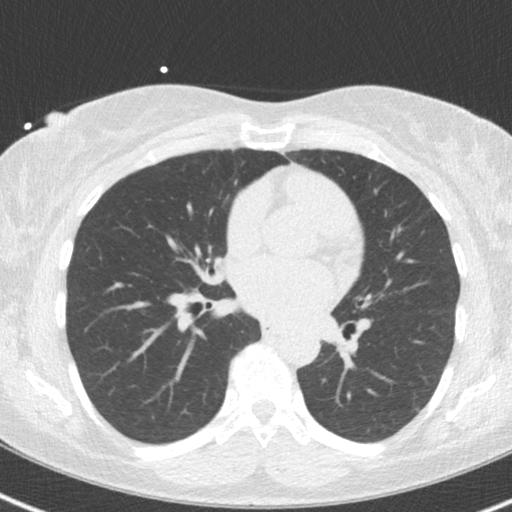
[im 37/42  lung]
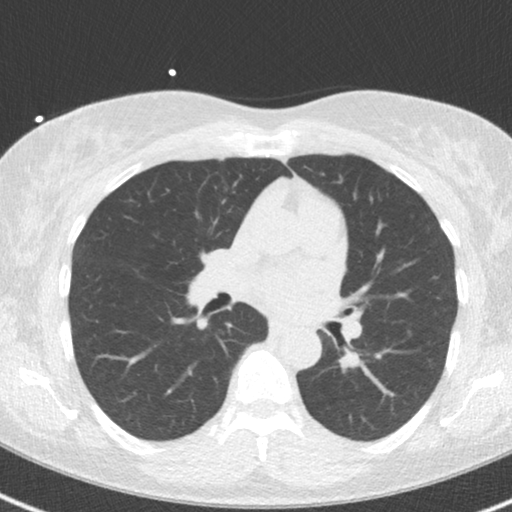

[16 of 20 positions shown; findings below may reference images not displayed]

FINDINGS: 2 mm right lower lobe nodule (axial image 4 of series 4),
nonspecific. Within the visualized portions of the thorax there are
no other larger more suspicious appearing pulmonary nodules or
masses, there is no acute consolidative airspace disease, no pleural
effusions, no pneumothorax and no lymphadenopathy. Visualized
portions of the upper abdomen are unremarkable. There are no
aggressive appearing lytic or blastic lesions noted in the
visualized portions of the skeleton.
IMPRESSION: 1. 2 mm right lower lobe pulmonary nodule, nonspecific, but
statistically likely benign. No follow-up needed if patient is
low-risk. Non-contrast chest CT can be considered in 12 months if
patient is high-risk. This recommendation follows the consensus
statement: Guidelines for Management of Incidental Pulmonary Nodules
Detected on CT Images: From the [HOSPITAL] 5897; Radiology
FINDINGS: Non-cardiac: See separate report from [[email or web address]].

Ascending Aorta: 28 mm at mid ascending aorta measured in axial
plane.

Pericardium: Normal

Coronary arteries:

Coronary calcium score of 0. This was 0 percentile for age and sex
matched control.

Normal appearing coronary origins.
IMPRESSION: Coronary calcium score of 0. This was 0 percentile for age and sex
matched control.

*** End of Addendum ***
EXAM:
OVER-READ INTERPRETATION  CT CHEST

The following report is an over-read performed by radiologist Dr.
Knud-Erik Sund [[email or web address]] on 03/14/2019. This
over-read does not include interpretation of cardiac or coronary
anatomy or pathology. The coronary calcium score/coronary CTA
interpretation by the cardiologist is attached.
FINDINGS: 2 mm right lower lobe nodule (axial image 4 of series 4),
nonspecific. Within the visualized portions of the thorax there are
no other larger more suspicious appearing pulmonary nodules or
masses, there is no acute consolidative airspace disease, no pleural
effusions, no pneumothorax and no lymphadenopathy. Visualized
portions of the upper abdomen are unremarkable. There are no
aggressive appearing lytic or blastic lesions noted in the
visualized portions of the skeleton.
IMPRESSION: 1. 2 mm right lower lobe pulmonary nodule, nonspecific, but
statistically likely benign. No follow-up needed if patient is
low-risk. Non-contrast chest CT can be considered in 12 months if
patient is high-risk. This recommendation follows the consensus
statement: Guidelines for Management of Incidental Pulmonary Nodules
Detected on CT Images: From the [HOSPITAL] 5897; Radiology

## 2019-10-07 NOTE — Assessment & Plan Note (Signed)
Decision today to treat with OMT was based on Physical Exam  After verbal consent patient was treated with HVLA, ME, FPR techniques in cervical, thoracic, rib lumbar and sacral areas  Patient tolerated the procedure well with improvement in symptoms  Patient given exercises, stretches and lifestyle modifications  See medications in patient instructions if given  Patient will follow up in 6-12 weeks

## 2019-10-07 NOTE — Progress Notes (Signed)
[name] [place] [address] [place], [place] [address] Phone: [phone] Subjective:   I Kandace Blitz am serving as a Education administrator for Dr. Hulan Saas.  CC: Back pain follow-up  [id number]  Annette [name] is a 63 y.o. female coming in with complaint of back pain. States she feels decent. Shoulders and left sided sciatica.  Patient describes the pain syndrome, intermittent sensation and stable.  Patient is to increase activity slowly over the course of the next several weeks.  Patient has noted some mild soreness but nothing severe.     Past Medical History:  Diagnosis Date  . ALLERGIC RHINITIS 10/01/2007  . ANXIETY 11/10/2010  . ATTENTION DEFICIT DISORDER 11/10/2010  . BARTHOLIN'S CYST 08/20/2007  . Carpal tunnel syndrome 08/20/2007  . COLONIC POLYPS, HX OF 08/20/2007  . DEPRESSION 08/20/2007  . EMPHYSEMA 06/16/2010  . HOT FLASHES 09/26/2009  . HYPERLIPIDEMIA 08/20/2007  . HYPERTENSION 08/20/2007  . Insomnia, unspecified 10/01/2007  . ISCHEMIC COLITIS, HX OF 10/01/2007  . MENOPAUSAL DISORDER 11/07/2009  . OBESITY, MILD 08/20/2007  . OSTEOPENIA 06/16/2010  . POLYARTHRALGIA 11/06/2008  . Raynaud's syndrome 08/20/2007   Past Surgical History:  Procedure Laterality Date  . TONSILLECTOMY     Social History   Socioeconomic History  . Marital status: Married    Spouse name: Not on file  . Number of children: 1  . Years of education: Not on file  . Highest education level: Not on file  Occupational History    Employer: [employer]  Social Needs  . Financial resource strain: Not on file  . Food insecurity    Worry: Not on file    Inability: Not on file  . Transportation needs    Medical: Not on file    Non-medical: Not on file  Tobacco Use  . Smoking status: Former Smoker    Quit date: 12/27/1990    Years since quitting: 28.8  . Smokeless tobacco: Never Used  Substance and Sexual Activity  . Alcohol use: Yes    Comment: one drink per  day  . Drug use: No  . Sexual activity: Not on file  Lifestyle  . Physical activity    Days per week: Not on file    Minutes per session: Not on file  . Stress: Not on file  Relationships  . Social Herbalist on phone: Not on file    Gets together: Not on file    Attends religious service: Not on file    Active member of club or organization: Not on file    Attends meetings of clubs or organizations: Not on file    Relationship status: Not on file  Other Topics Concern  . Not on file  Social History Narrative  . Not on file   No Known Allergies Family History  Problem Relation Age of Onset  . Osteoporosis Mother   . ADD / ADHD Daughter   . Osteoporosis Maternal Grandmother   . Breast cancer Maternal Grandmother   . Coronary artery disease Other   . Hypertension Other   . Prostate cancer Other   . Colon cancer Neg Hx      Current Outpatient Medications (Cardiovascular):  .  rosuvastatin (CRESTOR) 10 MG tablet, 1 tab by mouth every other day  Current Outpatient Medications (Respiratory):  .  albuterol (PROVENTIL HFA;VENTOLIN HFA) 108 (90 Base) MCG/ACT inhaler, Inhale 2 puffs into the lungs every 6 (six) hours as needed for wheezing or  shortness of breath. .  fluticasone (FLONASE) 50 MCG/ACT nasal spray, Place 2 sprays into both nostrils daily.  Current Outpatient Medications (Analgesics):  .  ibuprofen (ADVIL,MOTRIN) 200 MG tablet, Take 400 mg by mouth every 6 (six) hours as needed for moderate pain. .  meloxicam (MOBIC) 15 MG tablet, Take 1 tablet (15 mg total) by mouth daily.   Current Outpatient Medications (Other):  Marland Kitchen  Diclofenac Sodium (PENNSAID) 2 % SOLN, Place 2 application onto the skin 2 (two) times daily. .  Estradiol (VAGIFEM) 10 MCG TABS vaginal tablet, Place 1 tablet (10 mcg total) vaginally 2 (two) times a week. .  gabapentin (NEURONTIN) 100 MG capsule, Take 2 capsules (200 mg total) by mouth at bedtime. Marland Kitchen  tiZANidine (ZANAFLEX) 4 MG capsule,  TAKE 1 CAPSULE BY MOUTH EVERY EVENING .  zolpidem (AMBIEN) 10 MG tablet, Take 1 tablet (10 mg total) by mouth at bedtime as needed. for sleep    Past medical history, social, surgical and family history all reviewed in electronic medical record.  No pertanent information unless stated regarding to the chief complaint.   Review of Systems:  No headache, visual changes, nausea, vomiting, diarrhea, constipation, dizziness, abdominal pain, skin rash, fevers, chills, night sweats, weight loss, swollen lymph nodes, joint swelling, , chest pain, shortness of breath, mood changes.   Objective  Blood pressure 130/70, pulse 63, height 5\' 3"  (1.6 m), weight 167 lb (75.8 kg), SpO2 98 %.    General: No apparent distress alert and oriented x3 mood and affect normal, dressed appropriately.  HEENT: Pupils equal, extraocular movements intact  Respiratory: Patient's speak in full sentences and does not appear short of breath  Cardiovascular: No lower extremity edema, non tender, no erythema  Skin: Warm dry intact with no signs of infection or rash on extremities or on axial skeleton.  Abdomen: Soft nontender  Neuro: Cranial nerves II through XII are intact, neurovascularly intact in all extremities with 2+ DTRs and 2+ pulses.  Lymph: No lymphadenopathy of posterior or anterior cervical chain or axillae bilaterally.  Gait normal with good balance and coordination.  MSK:  Non tender with full range of motion and good stability and symmetric strength and tone of shoulders, elbows, wrist, hip, knee and ankles bilaterally.  Back Exam:  Inspection: Unremarkable  Motion: Flexion 45 deg, Extension 25 deg, Side Bending to 25 deg bilaterally,  Rotation to 45 deg bilaterally  SLR laying: Negative  XSLR laying: Negative  Palpable tenderness: Tender to palpation paraspinal structure lumbar spine. FABER: Tightness bilaterally. Sensory change: Gross sensation intact to all lumbar and sacral dermatomes.  Reflexes: 2+  at both patellar tendons, 2+ at achilles tendons, Babinski's downgoing.  Strength at foot  Plantar-flexion: 5/5 Dorsi-flexion: 5/5 Eversion: 5/5 Inversion: 5/5  Leg strength  Quad: 5/5 Hamstring: 5/5 Hip flexor: 5/5 Hip abductors: 5/5  Gait unremarkable.  Osteopathic findings C2 flexed rotated and side bent right C6 flexed rotated and side bent left T3 extended rotated and side bent right inhaled third rib T7 extended rotated and side bent left L2 flexed rotated and side bent right Sacrum right on right    Impression and Recommendations:     This case required medical decision making of moderate complexity. The above documentation has been reviewed and is accurate and complete Lyndal Pulley, DO       Note: This dictation was prepared with Dragon dictation along with smaller phrase technology. Any transcriptional errors that result from this process are unintentional.

## 2019-10-07 NOTE — Assessment & Plan Note (Signed)
Continues to have some difficulty.  Patient though seems to be doing relatively well though.  Discussed posture and ergonomics, discussed which activities to do which wants to avoid.  Patient is to increase activity slowly.  Discussed icing regimen and home exercises.  Follow-up again in 6 to 12 weeks

## 2019-10-09 ENCOUNTER — Ambulatory Visit (INDEPENDENT_AMBULATORY_CARE_PROVIDER_SITE_OTHER): Payer: PRIVATE HEALTH INSURANCE | Admitting: Family Medicine

## 2019-10-09 ENCOUNTER — Encounter: Payer: Self-pay | Admitting: Family Medicine

## 2019-10-09 ENCOUNTER — Other Ambulatory Visit: Payer: Self-pay

## 2019-10-09 VITALS — BP 130/70 | HR 63 | Ht 63.0 in | Wt 167.0 lb

## 2019-10-09 DIAGNOSIS — M533 Sacrococcygeal disorders, not elsewhere classified: Secondary | ICD-10-CM | POA: Diagnosis not present

## 2019-10-09 DIAGNOSIS — M999 Biomechanical lesion, unspecified: Secondary | ICD-10-CM | POA: Diagnosis not present

## 2019-10-09 NOTE — Patient Instructions (Signed)
See me again in 6 weeks Eat within 30 mins of working out

## 2019-11-09 ENCOUNTER — Encounter: Payer: Self-pay | Admitting: Internal Medicine

## 2019-11-09 ENCOUNTER — Encounter: Payer: Self-pay | Admitting: Family Medicine

## 2019-11-19 NOTE — Assessment & Plan Note (Deleted)
Sacroiliac dysfunction.  Discussed with patient about exercises and core strengthening as well as hip abductor strengthening.  Discussed which activities to do which wants to avoid.  Patient is to increase activity as tolerated.  Discussed icing regimen.  Follow-up again in 4 to 8 weeks

## 2019-11-19 NOTE — Progress Notes (Deleted)
[name] [place], [place] [address] Phone: [phone] Subjective:    I'm seeing this patient by the request  of:  Biagio Borg, MD   This visit occurred during the SARS-CoV-2 public health emergency.  Safety protocols were in place, including screening questions prior to the visit, additional usage of staff PPE, and extensive cleaning of exam room while observing appropriate contact time as indicated for disinfecting solutions.     CC: Neck pain neck pain follow-up  [id number]  Annette [name] is a 63 y.o. female coming in with complaint of ***  Onset-  Location Duration-  Character- Aggravating factors- Reliving factors-  Therapies tried-  Severity-     Past Medical History:  Diagnosis Date  . ALLERGIC RHINITIS 10/01/2007  . ANXIETY 11/10/2010  . ATTENTION DEFICIT DISORDER 11/10/2010  . BARTHOLIN'S CYST 08/20/2007  . Carpal tunnel syndrome 08/20/2007  . COLONIC POLYPS, HX OF 08/20/2007  . DEPRESSION 08/20/2007  . EMPHYSEMA 06/16/2010  . HOT FLASHES 09/26/2009  . HYPERLIPIDEMIA 08/20/2007  . HYPERTENSION 08/20/2007  . Insomnia, unspecified 10/01/2007  . ISCHEMIC COLITIS, HX OF 10/01/2007  . MENOPAUSAL DISORDER 11/07/2009  . OBESITY, MILD 08/20/2007  . OSTEOPENIA 06/16/2010  . POLYARTHRALGIA 11/06/2008  . Raynaud's syndrome 08/20/2007   Past Surgical History:  Procedure Laterality Date  . TONSILLECTOMY     Social History   Socioeconomic History  . Marital status: Married    Spouse name: Not on file  . Number of children: 1  . Years of education: Not on file  . Highest education level: Not on file  Occupational History    Employer: [employer]  Social Needs  . Financial resource strain: Not on file  . Food insecurity    Worry: Not on file    Inability: Not on file  . Transportation needs    Medical: Not on file    Non-medical: Not on file  Tobacco Use  . Smoking status: Former Smoker    Quit date:  12/27/1990    Years since quitting: 28.9  . Smokeless tobacco: Never Used  Substance and Sexual Activity  . Alcohol use: Yes    Comment: one drink per day  . Drug use: No  . Sexual activity: Not on file  Lifestyle  . Physical activity    Days per week: Not on file    Minutes per session: Not on file  . Stress: Not on file  Relationships  . Social Herbalist on phone: Not on file    Gets together: Not on file    Attends religious service: Not on file    Active member of club or organization: Not on file    Attends meetings of clubs or organizations: Not on file    Relationship status: Not on file  Other Topics Concern  . Not on file  Social History Narrative  . Not on file   No Known Allergies Family History  Problem Relation Age of Onset  . Osteoporosis Mother   . ADD / ADHD Daughter   . Osteoporosis Maternal Grandmother   . Breast cancer Maternal Grandmother   . Coronary artery disease Other   . Hypertension Other   . Prostate cancer Other   . Colon cancer Neg Hx      Current Outpatient Medications (Cardiovascular):  .  rosuvastatin (CRESTOR) 10 MG tablet, 1 tab by mouth every other day  Current Outpatient Medications (Respiratory):  .  albuterol (PROVENTIL HFA;VENTOLIN HFA) 108 (90 Base) MCG/ACT inhaler, Inhale 2 puffs into the lungs every 6 (six) hours as needed for wheezing or shortness of breath. .  fluticasone (FLONASE) 50 MCG/ACT nasal spray, Place 2 sprays into both nostrils daily.  Current Outpatient Medications (Analgesics):  .  ibuprofen (ADVIL,MOTRIN) 200 MG tablet, Take 400 mg by mouth every 6 (six) hours as needed for moderate pain. .  meloxicam (MOBIC) 15 MG tablet, Take 1 tablet (15 mg total) by mouth daily.   Current Outpatient Medications (Other):  Marland Kitchen  Diclofenac Sodium (PENNSAID) 2 % SOLN, Place 2 application onto the skin 2 (two) times daily. .  Estradiol (VAGIFEM) 10 MCG TABS vaginal tablet, Place 1 tablet (10 mcg total) vaginally 2  (two) times a week. .  gabapentin (NEURONTIN) 100 MG capsule, Take 2 capsules (200 mg total) by mouth at bedtime. Marland Kitchen  tiZANidine (ZANAFLEX) 4 MG capsule, TAKE 1 CAPSULE BY MOUTH EVERY EVENING .  zolpidem (AMBIEN) 10 MG tablet, Take 1 tablet (10 mg total) by mouth at bedtime as needed. for sleep    Past medical history, social, surgical and family history all reviewed in electronic medical record.  No pertanent information unless stated regarding to the chief complaint.   Review of Systems:  No headache, visual changes, nausea, vomiting, diarrhea, constipation, dizziness, abdominal pain, skin rash, fevers, chills, night sweats, weight loss, swollen lymph nodes, body aches, joint swelling, muscle aches, chest pain, shortness of breath, mood changes.   Objective  There were no vitals taken for this visit.    General: No apparent distress alert and oriented x3 mood and affect normal, dressed appropriately.  HEENT: Pupils equal, extraocular movements intact  Respiratory: Patient's speak in full sentences and does not appear short of breath  Cardiovascular: No lower extremity edema, non tender, no erythema  Skin: Warm dry intact with no signs of infection or rash on extremities or on axial skeleton.  Abdomen: Soft nontender  Neuro: Cranial nerves II through XII are intact, neurovascularly intact in all extremities with 2+ DTRs and 2+ pulses.  Lymph: No lymphadenopathy of posterior or anterior cervical chain or axillae bilaterally.  Gait normal with good balance and coordination.  MSK:  Non tender with full range of motion and good stability and symmetric strength and tone of shoulders, elbows, wrist, hip, knee and ankles bilaterally.  Neck: Inspection unremarkable. No palpable stepoffs. Negative Spurling's maneuver. Full neck range of motion Grip strength and sensation normal in bilateral hands Strength good C4 to T1 distribution No sensory change to C4 to T1 Negative Hoffman sign  bilaterally Reflexes normal  Back Exam:  Inspection: Unremarkable  Motion: Flexion 45 deg, Extension 45 deg, Side Bending to 45 deg bilaterally,  Rotation to 45 deg bilaterally  SLR laying: Negative  XSLR laying: Negative  Palpable tenderness: None. FABER: negative. Sensory change: Gross sensation intact to all lumbar and sacral dermatomes.  Reflexes: 2+ at both patellar tendons, 2+ at achilles tendons, Babinski's downgoing.  Strength at foot  Plantar-flexion: 5/5 Dorsi-flexion: 5/5 Eversion: 5/5 Inversion: 5/5  Leg strength  Quad: 5/5 Hamstring: 5/5 Hip flexor: 5/5 Hip abductors: 5/5  Gait unremarkable.  Osteopathic findings  C2 flexed rotated and side bent right C6 flexed rotated and side bent left T3 extended rotated and side bent right inhaled third rib T9 extended rotated and side bent left L2 flexed rotated and side bent right Sacrum right on right    Impression and Recommendations:     This case required medical  decision making of moderate complexity. The above documentation has been reviewed and is accurate and complete Lyndal Pulley, DO       Note: This dictation was prepared with Dragon dictation along with smaller phrase technology. Any transcriptional errors that result from this process are unintentional.

## 2019-11-19 NOTE — Assessment & Plan Note (Deleted)
Decision today to treat with OMT was based on Physical Exam  After verbal consent patient was treated with HVLA, ME, FPR techniques in cervical, thoracic, rib lumbar and sacral areas  Patient tolerated the procedure well with improvement in symptoms  Patient given exercises, stretches and lifestyle modifications  See medications in patient instructions if given  Patient will follow up in 4-6 weeks 

## 2019-11-20 ENCOUNTER — Ambulatory Visit: Payer: PRIVATE HEALTH INSURANCE | Admitting: Family Medicine

## 2019-12-03 ENCOUNTER — Encounter: Payer: Self-pay | Admitting: Internal Medicine

## 2020-01-21 ENCOUNTER — Encounter: Payer: Self-pay | Admitting: Internal Medicine

## 2020-01-23 ENCOUNTER — Other Ambulatory Visit (INDEPENDENT_AMBULATORY_CARE_PROVIDER_SITE_OTHER): Payer: PRIVATE HEALTH INSURANCE

## 2020-01-23 DIAGNOSIS — E538 Deficiency of other specified B group vitamins: Secondary | ICD-10-CM | POA: Diagnosis not present

## 2020-01-23 DIAGNOSIS — E611 Iron deficiency: Secondary | ICD-10-CM | POA: Diagnosis not present

## 2020-01-23 DIAGNOSIS — E559 Vitamin D deficiency, unspecified: Secondary | ICD-10-CM

## 2020-01-23 DIAGNOSIS — R739 Hyperglycemia, unspecified: Secondary | ICD-10-CM | POA: Diagnosis not present

## 2020-01-23 DIAGNOSIS — Z0000 Encounter for general adult medical examination without abnormal findings: Secondary | ICD-10-CM | POA: Diagnosis not present

## 2020-01-23 LAB — URINALYSIS, ROUTINE W REFLEX MICROSCOPIC
Bilirubin Urine: NEGATIVE
Hgb urine dipstick: NEGATIVE
Ketones, ur: NEGATIVE
Nitrite: NEGATIVE
RBC / HPF: NONE SEEN (ref 0–?)
Specific Gravity, Urine: 1.025 (ref 1.000–1.030)
Total Protein, Urine: NEGATIVE
Urine Glucose: NEGATIVE
Urobilinogen, UA: 0.2 (ref 0.0–1.0)
pH: 5.5 (ref 5.0–8.0)

## 2020-01-23 LAB — LIPID PANEL
Cholesterol: 156 mg/dL (ref 0–200)
HDL: 53.5 mg/dL (ref >39.00)
LDL Cholesterol: 79 mg/dL (ref 0–99)
NonHDL: 102.8
Total CHOL/HDL Ratio: 3
Triglycerides: 119 mg/dL (ref 0.0–149.0)
VLDL: 23.8 mg/dL (ref 0.0–40.0)

## 2020-01-23 LAB — CBC WITH DIFFERENTIAL/PLATELET
Basophils Absolute: 0.1 10*3/uL (ref 0.0–0.1)
Basophils Relative: 0.8 % (ref 0.0–3.0)
Eosinophils Absolute: 0.1 10*3/uL (ref 0.0–0.7)
Eosinophils Relative: 1.4 % (ref 0.0–5.0)
HCT: 41.9 % (ref 36.0–46.0)
Hemoglobin: 13.6 g/dL (ref 12.0–15.0)
Lymphocytes Relative: 43.7 % (ref 12.0–46.0)
Lymphs Abs: 2.8 10*3/uL (ref 0.7–4.0)
MCHC: 32.5 g/dL (ref 30.0–36.0)
MCV: 86.5 fl (ref 78.0–100.0)
Monocytes Absolute: 0.4 10*3/uL (ref 0.1–1.0)
Monocytes Relative: 6.5 % (ref 3.0–12.0)
Neutro Abs: 3.1 10*3/uL (ref 1.4–7.7)
Neutrophils Relative %: 47.6 % (ref 43.0–77.0)
Platelets: 208 10*3/uL (ref 150.0–400.0)
RBC: 4.85 Mil/uL (ref 3.87–5.11)
RDW: 14.2 % (ref 11.5–15.5)
WBC: 6.4 10*3/uL (ref 4.0–10.5)

## 2020-01-23 LAB — BASIC METABOLIC PANEL
BUN: 13 mg/dL (ref 6–23)
CO2: 27 mEq/L (ref 19–32)
Calcium: 9.5 mg/dL (ref 8.4–10.5)
Chloride: 107 mEq/L (ref 96–112)
Creatinine, Ser: 1 mg/dL (ref 0.40–1.20)
GFR: 68.12 mL/min (ref >60.00)
Glucose, Bld: 100 mg/dL — ABNORMAL HIGH (ref 70–99)
Potassium: 4.3 mEq/L (ref 3.5–5.1)
Sodium: 139 mEq/L (ref 135–145)

## 2020-01-23 LAB — HEMOGLOBIN A1C: Hgb A1c MFr Bld: 6.1 % (ref 4.6–6.5)

## 2020-01-23 LAB — HEPATIC FUNCTION PANEL
ALT: 19 U/L (ref 0–35)
AST: 22 U/L (ref 0–37)
Albumin: 4.2 g/dL (ref 3.5–5.2)
Alkaline Phosphatase: 70 U/L (ref 39–117)
Bilirubin, Direct: 0.1 mg/dL (ref 0.0–0.3)
Total Bilirubin: 0.5 mg/dL (ref 0.2–1.2)
Total Protein: 6.9 g/dL (ref 6.0–8.3)

## 2020-01-23 LAB — VITAMIN D 25 HYDROXY (VIT D DEFICIENCY, FRACTURES): VITD: 34.36 ng/mL (ref 30.00–100.00)

## 2020-01-23 LAB — IBC PANEL
Iron: 104 ug/dL (ref 42–145)
Saturation Ratios: 33.8 % (ref 20.0–50.0)
Transferrin: 220 mg/dL (ref 212.0–360.0)

## 2020-01-23 LAB — TSH: TSH: 1.72 u[IU]/mL (ref 0.35–4.50)

## 2020-01-23 LAB — VITAMIN B12: Vitamin B-12: 899 pg/mL (ref 211–911)

## 2020-01-28 ENCOUNTER — Ambulatory Visit (INDEPENDENT_AMBULATORY_CARE_PROVIDER_SITE_OTHER): Payer: PRIVATE HEALTH INSURANCE | Admitting: Internal Medicine

## 2020-01-28 ENCOUNTER — Other Ambulatory Visit: Payer: Self-pay

## 2020-01-28 ENCOUNTER — Encounter: Payer: Self-pay | Admitting: Internal Medicine

## 2020-01-28 VITALS — BP 118/76 | HR 56 | Temp 98.3°F | Ht 63.0 in | Wt 169.8 lb

## 2020-01-28 DIAGNOSIS — N390 Urinary tract infection, site not specified: Secondary | ICD-10-CM

## 2020-01-28 DIAGNOSIS — E2839 Other primary ovarian failure: Secondary | ICD-10-CM | POA: Diagnosis not present

## 2020-01-28 DIAGNOSIS — Z0000 Encounter for general adult medical examination without abnormal findings: Secondary | ICD-10-CM | POA: Diagnosis not present

## 2020-01-28 DIAGNOSIS — R739 Hyperglycemia, unspecified: Secondary | ICD-10-CM

## 2020-01-28 MED ORDER — CEPHALEXIN 500 MG PO CAPS: 500.0000 mg | ORAL_CAPSULE | Freq: Three times a day (TID) | ORAL | 0 refills | Status: AC

## 2020-01-28 NOTE — Assessment & Plan Note (Signed)

## 2020-01-28 NOTE — Patient Instructions (Addendum)
Please take all new medication as prescribed - the antibiotic  Please continue all other medications as before, and refills have been done if requested.  Please have the pharmacy call with any other refills you may need.  Please continue your efforts at being more active, low cholesterol diet, and weight control.  You are otherwise up to date with prevention measures today.  Please keep your appointments with your specialists as you may have planned  Please schedule the bone density test before leaving today at the scheduling desk (where you check out)  Please make an Appointment to return for your 1 year visit, or sooner if needed, with Lab testing by Appointment as well, to be done about 3-5 days before at the [facility] (so this is for TWO appointments - please see the scheduling desk as you leave)

## 2020-01-28 NOTE — Assessment & Plan Note (Signed)
stable overall by history and exam, recent data reviewed with pt, and pt to continue medical treatment as before,  to f/u any worsening symptoms or concerns  

## 2020-01-28 NOTE — Assessment & Plan Note (Signed)
Mild to mod, for antibx course,  to f/u any worsening symptoms or concerns 

## 2020-01-28 NOTE — Progress Notes (Signed)
Subjective:    Patient ID: Annette [name], female    DOB: 1956/06/26, 64 y.o.   MRN: [id number]  HPI  Here for wellness and f/u;  Overall doing ok;  Pt denies Chest pain, worsening SOB, DOE, wheezing, orthopnea, PND, worsening LE edema, palpitations, dizziness or syncope.  Pt denies neurological change such as new headache, facial or extremity weakness.  Pt denies polydipsia, polyuria, or low sugar symptoms. Pt states overall good compliance with treatment and medications, good tolerability, and has been trying to follow appropriate diet.  Pt denies worsening depressive symptoms, suicidal ideation or panic. No fever, night sweats, wt loss, loss of appetite, or other constitutional symptoms.  Pt states good ability with ADL's, has low fall risk, home safety reviewed and adequate, no other significant changes in hearing or vision, and only occasionally active with exercise.  More stress with working at home, plans to retire in 1.5 yrs. Wt Readings from Last 3 Encounters:  01/28/20 169 lb 12.8 oz (77 kg)  10/09/19 167 lb (75.8 kg)  08/28/19 163 lb (73.9 kg)  Also with mild low mid abd discomfort with intermittent mild dysuria off and on for several wks. Past Medical History:  Diagnosis Date  . ALLERGIC RHINITIS 10/01/2007  . ANXIETY 11/10/2010  . ATTENTION DEFICIT DISORDER 11/10/2010  . BARTHOLIN'S CYST 08/20/2007  . Carpal tunnel syndrome 08/20/2007  . COLONIC POLYPS, HX OF 08/20/2007  . DEPRESSION 08/20/2007  . EMPHYSEMA 06/16/2010  . HOT FLASHES 09/26/2009  . HYPERLIPIDEMIA 08/20/2007  . HYPERTENSION 08/20/2007  . Insomnia, unspecified 10/01/2007  . ISCHEMIC COLITIS, HX OF 10/01/2007  . MENOPAUSAL DISORDER 11/07/2009  . OBESITY, MILD 08/20/2007  . OSTEOPENIA 06/16/2010  . POLYARTHRALGIA 11/06/2008  . Raynaud's syndrome 08/20/2007   Past Surgical History:  Procedure Laterality Date  . TONSILLECTOMY      reports that she quit smoking about 29 years ago. She has never used smokeless tobacco. She  reports current alcohol use. She reports that she does not use drugs. family history includes ADD / ADHD in her daughter; Breast cancer in her maternal grandmother; Coronary artery disease in an other family member; Hypertension in an other family member; Osteoporosis in her maternal grandmother and mother; Prostate cancer in an other family member. No Known Allergies Current Outpatient Medications on File Prior to Visit  Medication Sig Dispense Refill  . Diclofenac Sodium (PENNSAID) 2 % SOLN Place 2 application onto the skin 2 (two) times daily. 112 g 3  . Estradiol (VAGIFEM) 10 MCG TABS vaginal tablet Place 1 tablet (10 mcg total) vaginally 2 (two) times a week. 24 tablet 3  . gabapentin (NEURONTIN) 100 MG capsule Take 2 capsules (200 mg total) by mouth at bedtime. 180 capsule 3  . ibuprofen (ADVIL,MOTRIN) 200 MG tablet Take 400 mg by mouth every 6 (six) hours as needed for moderate pain.    . meloxicam (MOBIC) 15 MG tablet Take 1 tablet (15 mg total) by mouth daily. 90 tablet 3  . rosuvastatin (CRESTOR) 10 MG tablet 1 tab by mouth every other day 45 tablet 3  . tiZANidine (ZANAFLEX) 4 MG capsule TAKE 1 CAPSULE BY MOUTH EVERY EVENING 90 capsule 1  . zolpidem (AMBIEN) 10 MG tablet Take 1 tablet (10 mg total) by mouth at bedtime as needed. for sleep 90 tablet 1  . albuterol (PROVENTIL HFA;VENTOLIN HFA) 108 (90 Base) MCG/ACT inhaler Inhale 2 puffs into the lungs every 6 (six) hours as needed for wheezing or shortness of breath. (Patient not taking:  Reported on 01/28/2020) 1 Inhaler 2  . fluticasone (FLONASE) 50 MCG/ACT nasal spray Place 2 sprays into both nostrils daily. (Patient not taking: Reported on 01/28/2020) 16 g 6   No current facility-administered medications on file prior to visit.   Review of Systems All otherwise neg per pt     Objective:   Physical Exam BP 118/76   Pulse (!) 56   Temp 98.3 F (36.8 C)   Ht 5\' 3"  (1.6 m)   Wt 169 lb 12.8 oz (77 kg)   SpO2 98%   BMI 30.08 kg/m    VS noted,  Constitutional: Pt appears in NAD HENT: Head: NCAT.  Right Ear: External ear normal.  Left Ear: External ear normal.  Eyes: . Pupils are equal, round, and reactive to light. Conjunctivae and EOM are normal Nose: without d/c or deformity Neck: Neck supple. Gross normal ROM Cardiovascular: Normal rate and regular rhythm.   Pulmonary/Chest: Effort normal and breath sounds without rales or wheezing.  Abd:  Soft, + mild low mid abd tender, ND, + BS, no organomegaly Neurological: Pt is alert. At baseline orientation, motor grossly intact Skin: Skin is warm. No rashes, other new lesions, no LE edema Psychiatric: Pt behavior is normal without agitation  All otherwise neg per pt Lab Results  Component Value Date   WBC 6.4 01/23/2020   HGB 13.6 01/23/2020   HCT 41.9 01/23/2020   PLT 208.0 01/23/2020   GLUCOSE 100 (H) 01/23/2020   CHOL 156 01/23/2020   TRIG 119.0 01/23/2020   HDL 53.50 01/23/2020   LDLDIRECT 137.0 12/11/2013   LDLCALC 79 01/23/2020   ALT 19 01/23/2020   AST 22 01/23/2020   NA 139 01/23/2020   K 4.3 01/23/2020   CL 107 01/23/2020   CREATININE 1.00 01/23/2020   BUN 13 01/23/2020   CO2 27 01/23/2020   TSH 1.72 01/23/2020   HGBA1C 6.1 01/23/2020       Assessment & Plan:

## 2020-02-06 ENCOUNTER — Ambulatory Visit (INDEPENDENT_AMBULATORY_CARE_PROVIDER_SITE_OTHER)
Admission: RE | Admit: 2020-02-06 | Discharge: 2020-02-06 | Disposition: A | Discharge status: Home / Self Care | Payer: PRIVATE HEALTH INSURANCE | Source: Ambulatory Visit | Attending: Internal Medicine | Admitting: Internal Medicine

## 2020-02-06 ENCOUNTER — Other Ambulatory Visit: Payer: Self-pay

## 2020-02-06 DIAGNOSIS — E2839 Other primary ovarian failure: Secondary | ICD-10-CM

## 2020-03-28 LAB — HM MAMMOGRAPHY

## 2020-04-04 ENCOUNTER — Encounter: Payer: Self-pay | Admitting: Internal Medicine

## 2020-10-13 ENCOUNTER — Encounter: Payer: Self-pay | Admitting: Internal Medicine

## 2020-10-13 DIAGNOSIS — F32A Depression, unspecified: Secondary | ICD-10-CM

## 2020-10-13 DIAGNOSIS — N390 Urinary tract infection, site not specified: Secondary | ICD-10-CM

## 2020-11-04 ENCOUNTER — Ambulatory Visit (INDEPENDENT_AMBULATORY_CARE_PROVIDER_SITE_OTHER): Payer: PRIVATE HEALTH INSURANCE | Admitting: Psychologist

## 2020-11-04 DIAGNOSIS — F330 Major depressive disorder, recurrent, mild: Secondary | ICD-10-CM | POA: Diagnosis not present

## 2020-11-24 ENCOUNTER — Ambulatory Visit: Payer: PRIVATE HEALTH INSURANCE | Admitting: Psychologist

## 2020-12-01 ENCOUNTER — Other Ambulatory Visit: Payer: Self-pay | Admitting: Internal Medicine

## 2020-12-01 NOTE — Telephone Encounter (Signed)
Ok to let pt know, this antibiotic is not normally a refillable medication, please consider OV

## 2020-12-12 ENCOUNTER — Ambulatory Visit (INDEPENDENT_AMBULATORY_CARE_PROVIDER_SITE_OTHER): Payer: PRIVATE HEALTH INSURANCE | Admitting: Psychologist

## 2020-12-12 DIAGNOSIS — F330 Major depressive disorder, recurrent, mild: Secondary | ICD-10-CM

## 2021-01-16 ENCOUNTER — Ambulatory Visit (INDEPENDENT_AMBULATORY_CARE_PROVIDER_SITE_OTHER): Payer: PRIVATE HEALTH INSURANCE | Admitting: Psychologist

## 2021-01-16 DIAGNOSIS — F330 Major depressive disorder, recurrent, mild: Secondary | ICD-10-CM | POA: Diagnosis not present

## 2021-01-21 ENCOUNTER — Other Ambulatory Visit (INDEPENDENT_AMBULATORY_CARE_PROVIDER_SITE_OTHER): Payer: PRIVATE HEALTH INSURANCE

## 2021-01-21 ENCOUNTER — Other Ambulatory Visit: Payer: Self-pay

## 2021-01-21 DIAGNOSIS — Z0000 Encounter for general adult medical examination without abnormal findings: Secondary | ICD-10-CM | POA: Diagnosis not present

## 2021-01-21 DIAGNOSIS — R739 Hyperglycemia, unspecified: Secondary | ICD-10-CM

## 2021-01-21 DIAGNOSIS — N390 Urinary tract infection, site not specified: Secondary | ICD-10-CM

## 2021-01-21 LAB — CBC WITH DIFFERENTIAL/PLATELET
Basophils Absolute: 0 10*3/uL (ref 0.0–0.1)
Basophils Relative: 0.5 % (ref 0.0–3.0)
Eosinophils Absolute: 0.1 10*3/uL (ref 0.0–0.7)
Eosinophils Relative: 1.7 % (ref 0.0–5.0)
HCT: 39.1 % (ref 36.0–46.0)
Hemoglobin: 12.7 g/dL (ref 12.0–15.0)
Lymphocytes Relative: 49.8 % — ABNORMAL HIGH (ref 12.0–46.0)
Lymphs Abs: 3.4 10*3/uL (ref 0.7–4.0)
MCHC: 32.6 g/dL (ref 30.0–36.0)
MCV: 86.6 fl (ref 78.0–100.0)
Monocytes Absolute: 0.4 10*3/uL (ref 0.1–1.0)
Monocytes Relative: 6.5 % (ref 3.0–12.0)
Neutro Abs: 2.8 10*3/uL (ref 1.4–7.7)
Neutrophils Relative %: 41.5 % — ABNORMAL LOW (ref 43.0–77.0)
Platelets: 217 10*3/uL (ref 150.0–400.0)
RBC: 4.51 Mil/uL (ref 3.87–5.11)
RDW: 14.6 % (ref 11.5–15.5)
WBC: 6.7 10*3/uL (ref 4.0–10.5)

## 2021-01-21 LAB — HEPATIC FUNCTION PANEL
ALT: 14 U/L (ref 0–35)
AST: 18 U/L (ref 0–37)
Albumin: 4.1 g/dL (ref 3.5–5.2)
Alkaline Phosphatase: 61 U/L (ref 39–117)
Bilirubin, Direct: 0.1 mg/dL (ref 0.0–0.3)
Total Bilirubin: 0.3 mg/dL (ref 0.2–1.2)
Total Protein: 6.7 g/dL (ref 6.0–8.3)

## 2021-01-21 LAB — URINALYSIS, ROUTINE W REFLEX MICROSCOPIC
Bilirubin Urine: NEGATIVE
Hgb urine dipstick: NEGATIVE
Ketones, ur: NEGATIVE
Nitrite: NEGATIVE
RBC / HPF: NONE SEEN (ref 0–?)
Specific Gravity, Urine: 1.01 (ref 1.000–1.030)
Total Protein, Urine: NEGATIVE
Urine Glucose: NEGATIVE
Urobilinogen, UA: 0.2 (ref 0.0–1.0)
pH: 5.5 (ref 5.0–8.0)

## 2021-01-21 LAB — LIPID PANEL
Cholesterol: 161 mg/dL (ref 0–200)
HDL: 49.9 mg/dL (ref >39.00)
LDL Cholesterol: 85 mg/dL (ref 0–99)
NonHDL: 111.4
Total CHOL/HDL Ratio: 3
Triglycerides: 133 mg/dL (ref 0.0–149.0)
VLDL: 26.6 mg/dL (ref 0.0–40.0)

## 2021-01-21 LAB — BASIC METABOLIC PANEL
BUN: 20 mg/dL (ref 6–23)
CO2: 26 mEq/L (ref 19–32)
Calcium: 9.3 mg/dL (ref 8.4–10.5)
Chloride: 105 mEq/L (ref 96–112)
Creatinine, Ser: 1.06 mg/dL (ref 0.40–1.20)
GFR: 56.36 mL/min — ABNORMAL LOW (ref >60.00)
Glucose, Bld: 91 mg/dL (ref 70–99)
Potassium: 4.3 mEq/L (ref 3.5–5.1)
Sodium: 137 mEq/L (ref 135–145)

## 2021-01-21 LAB — TSH: TSH: 3.71 u[IU]/mL (ref 0.35–4.50)

## 2021-01-21 LAB — HEMOGLOBIN A1C: Hgb A1c MFr Bld: 5.6 % (ref 4.6–6.5)

## 2021-01-22 LAB — URINE CULTURE

## 2021-01-27 ENCOUNTER — Other Ambulatory Visit: Payer: Self-pay

## 2021-01-28 ENCOUNTER — Encounter: Payer: PRIVATE HEALTH INSURANCE | Admitting: Internal Medicine

## 2021-01-28 ENCOUNTER — Ambulatory Visit (INDEPENDENT_AMBULATORY_CARE_PROVIDER_SITE_OTHER): Payer: PRIVATE HEALTH INSURANCE | Admitting: Internal Medicine

## 2021-01-28 ENCOUNTER — Encounter: Payer: Self-pay | Admitting: Internal Medicine

## 2021-01-28 VITALS — BP 120/68 | HR 54 | Temp 98.5°F | Ht 63.0 in | Wt 168.0 lb

## 2021-01-28 DIAGNOSIS — R739 Hyperglycemia, unspecified: Secondary | ICD-10-CM

## 2021-01-28 DIAGNOSIS — E7849 Other hyperlipidemia: Secondary | ICD-10-CM | POA: Diagnosis not present

## 2021-01-28 DIAGNOSIS — I10 Essential (primary) hypertension: Secondary | ICD-10-CM

## 2021-01-28 DIAGNOSIS — R1030 Lower abdominal pain, unspecified: Secondary | ICD-10-CM

## 2021-01-28 DIAGNOSIS — R102 Pelvic and perineal pain: Secondary | ICD-10-CM

## 2021-01-28 DIAGNOSIS — Z0000 Encounter for general adult medical examination without abnormal findings: Secondary | ICD-10-CM | POA: Diagnosis not present

## 2021-01-28 DIAGNOSIS — Z0001 Encounter for general adult medical examination with abnormal findings: Secondary | ICD-10-CM

## 2021-01-28 DIAGNOSIS — F5101 Primary insomnia: Secondary | ICD-10-CM

## 2021-01-28 MED ORDER — ZOLPIDEM TARTRATE 10 MG PO TABS: 10.0000 mg | ORAL_TABLET | Freq: Every evening | ORAL | 1 refills | Status: DC | PRN

## 2021-01-28 MED ORDER — ROSUVASTATIN CALCIUM 10 MG PO TABS
ORAL_TABLET | ORAL | 3 refills | Status: DC
Start: 2021-01-28 — End: 2021-02-04

## 2021-01-28 NOTE — Patient Instructions (Signed)
Please continue all other medications as before, and refills have been done if requested.  Please have the pharmacy call with any other refills you may need.  Please continue your efforts at being more active, low cholesterol diet, and weight control.  You are otherwise up to date with prevention measures today.  Please keep your appointments with your specialists as you may have planned  You will be contacted regarding the referral for: CT scan  Please make an Appointment to return for your 1 year visit, or sooner if needed, with Lab testing by Appointment as well, to be done about 3-5 days before at the [facility] (so this is for TWO appointments - please see the scheduling desk as you leave)  Due to the ongoing Covid 19 pandemic, our lab now requires an appointment for any labs done at our office.  If you need labs done and do not have an appointment, please call our office ahead of time to schedule before presenting to the lab for your testing.

## 2021-01-28 NOTE — Progress Notes (Addendum)
Established Patient Office Visit  Subjective:  Patient ID: Annette [name], female    DOB: 01-13-1956  Age: 65 y.o. MRN: [id number]   Somewhat depressed, mild, now doing counseling.    Chronic pelvic pain       Chief Complaint:: wellness exam and pelvic pain       HPI:  Annette [name] is a 65 y.o. female here for wellness exam, currently up to date, Pt denies chest pain, increased sob or doe, wheezing, orthopnea, PND, increased LE swelling, palpitations, dizziness or syncope.  Pt denies new neurological symptoms such as new headache, or facial or extremity weakness or numbness   Pt denies polydipsia, polyuria, Also has ongoing chronic issue with getting to sleep, asks for ambien refill.  Tolerating statin well, and trying to follow lower chol diet  Wt Readings from Last 3 Encounters:  01/28/21 168 lb (76.2 kg)  01/28/20 169 lb 12.8 oz (77 kg)  10/09/19 167 lb (75.8 kg)   BP Readings from Last 3 Encounters:  01/28/21 120/68  01/28/20 118/76  10/09/19 130/70   Immunization History  Administered Date(s) Administered  . Influenza Inj Mdck Quad With Preservative 09/27/2019  . Influenza Split 08/31/58  . Influenza Whole 10/08/2008  . Influenza, Quadrivalent, Recombinant, Inj, Pf 10/05/2019  . Influenza, Seasonal, Injecte, Preservative Fre 09/26/2013  . Influenza-Unspecified 09/27/2015, 10/26/2015, 09/28/2016, 09/26/2018, 09/29/2020  . PFIZER Comirnaty(Gray Top)Covid-19 Tri-Sucrose Vaccine 03/08/2020, 03/30/2020, 10/30/2020  . Td 11/10/2010  . Zoster Recombinat (Shingrix) 02/10/2018, 04/14/2018  There are no preventive care reminders to display for this patient.       Also c/o mild worsening overall recently lower abd /pelvic pain, ongoing for 2 yrs mild intermittent but now worsening mild to mod, more chronic persistent but Denies urinary symptoms such as dysuria, frequency, urgency, flank pain, hematuria or n/v, fever, chills.  And Denies worsening reflux, abd pain, dysphagia,  n/v, bowel change or blood.    Past Medical History:  Diagnosis Date  . ALLERGIC RHINITIS 10/01/2007  . ANXIETY 11/10/2010  . ATTENTION DEFICIT DISORDER 11/10/2010  . BARTHOLIN'S CYST 08/20/2007  . Carpal tunnel syndrome 08/20/2007  . COLONIC POLYPS, HX OF 08/20/2007  . DEPRESSION 08/20/2007  . EMPHYSEMA 06/16/2010  . HOT FLASHES 09/26/2009  . HYPERLIPIDEMIA 08/20/2007  . HYPERTENSION 08/20/2007  . Insomnia, unspecified 10/01/2007  . ISCHEMIC COLITIS, HX OF 10/01/2007  . MENOPAUSAL DISORDER 11/07/2009  . OBESITY, MILD 08/20/2007  . OSTEOPENIA 06/16/2010  . POLYARTHRALGIA 11/06/2008  . Raynaud's syndrome 08/20/2007   Past Surgical History:  Procedure Laterality Date  . TONSILLECTOMY      reports that she quit smoking about 30 years ago. She has never used smokeless tobacco. She reports current alcohol use. She reports that she does not use drugs. family history includes ADD / ADHD in her daughter; Breast cancer in her maternal grandmother; Coronary artery disease in an other family member; Hypertension in an other family member; Osteoporosis in her maternal grandmother and mother; Prostate cancer in an other family member. No Known Allergies Current Outpatient Medications on File Prior to Visit  Medication Sig Dispense Refill  . albuterol (PROVENTIL HFA;VENTOLIN HFA) 108 (90 Base) MCG/ACT inhaler Inhale 2 puffs into the lungs every 6 (six) hours as needed for wheezing or shortness of breath. 1 Inhaler 2  . Diclofenac Sodium (PENNSAID) 2 % SOLN Place 2 application onto the skin 2 (two) times daily. 112 g 3  . Estradiol (VAGIFEM) 10 MCG TABS vaginal tablet Place 1 tablet (10 mcg total)  vaginally 2 (two) times a week. 24 tablet 3  . fluticasone (FLONASE) 50 MCG/ACT nasal spray Place 2 sprays into both nostrils daily. 16 g 6  . gabapentin (NEURONTIN) 100 MG capsule Take 2 capsules (200 mg total) by mouth at bedtime. 180 capsule 3  . ibuprofen (ADVIL,MOTRIN) 200 MG tablet Take 400 mg by mouth every  6 (six) hours as needed for moderate pain.    . meloxicam (MOBIC) 15 MG tablet Take 1 tablet (15 mg total) by mouth daily. 90 tablet 3  . tiZANidine (ZANAFLEX) 4 MG capsule TAKE 1 CAPSULE BY MOUTH EVERY EVENING 90 capsule 1   No current facility-administered medications on file prior to visit.        ROS:  All others reviewed and negative.  Objective        PE:  BP 120/68   Pulse (!) 54   Temp 98.5 F (36.9 C) (Oral)   Ht 5\' 3"  (1.6 m)   Wt 168 lb (76.2 kg)   SpO2 99%   BMI 29.76 kg/m                 Constitutional: Pt appears in NAD               HENT: Head: NCAT.                Right Ear: External ear normal.                 Left Ear: External ear normal.                Eyes: . Pupils are equal, round, and reactive to light. Conjunctivae and EOM are normal               Nose: without d/c or deformity               Neck: Neck supple. Gross normal ROM               Cardiovascular: Normal rate and regular rhythm.                 Pulmonary/Chest: Effort normal and breath sounds without rales or wheezing.                Abd:  Soft, NT, ND, + BS, no organomegaly               Neurological: Pt is alert. At baseline orientation, motor grossly intact               Skin: Skin is warm. No rashes, no other new lesions, LE edema - none               Psychiatric: Pt behavior is normal without agitation   Micro: none  Cardiac tracings I have personally interpreted today:  none  Pertinent Radiological findings (summarize): none   Lab Results  Component Value Date   WBC 6.7 01/21/2021   HGB 12.7 01/21/2021   HCT 39.1 01/21/2021   PLT 217.0 01/21/2021   GLUCOSE 91 01/21/2021   CHOL 161 01/21/2021   TRIG 133.0 01/21/2021   HDL 49.90 01/21/2021   LDLDIRECT 137.0 12/11/2013   LDLCALC 85 01/21/2021   ALT 14 01/21/2021   AST 18 01/21/2021   NA 137 01/21/2021   K 4.3 01/21/2021   CL 105 01/21/2021   CREATININE 1.06 01/21/2021   BUN 20 01/21/2021   CO2 26 01/21/2021   TSH 3.71  01/21/2021   HGBA1C 5.6  01/21/2021   Assessment/Plan:  Annette [name] is a 65 y.o. Black or African American [2] female with  has a past medical history of ALLERGIC RHINITIS (10/01/2007), ANXIETY (11/10/2010), ATTENTION DEFICIT DISORDER (11/10/2010), BARTHOLIN'S CYST (08/20/2007), Carpal tunnel syndrome (08/20/2007), COLONIC POLYPS, HX OF (08/20/2007), DEPRESSION (08/20/2007), EMPHYSEMA (06/16/2010), HOT FLASHES (09/26/2009), HYPERLIPIDEMIA (08/20/2007), HYPERTENSION (08/20/2007), Insomnia, unspecified (10/01/2007), ISCHEMIC COLITIS, HX OF (10/01/2007), MENOPAUSAL DISORDER (11/07/2009), OBESITY, MILD (08/20/2007), OSTEOPENIA (06/16/2010), POLYARTHRALGIA (11/06/2008), and Raynaud's syndrome (08/20/2007). Encounter for well adult exam with abnormal findings Age and sex appropriate education and counseling updated with regular exercise and diet Referrals for preventative services - none needed Immunizations addressed - none needed Smoking counseling  - none needed Evidence for depression or other mood disorder - none significant Most recent labs reviewed. I have personally reviewed and have noted: 1) the patient's medical and social history 2) The patient's current medications and supplements 3) The patient's height, weight, and BMI have been recorded in the chart   Lower abdominal pain Etiology unclear, exam benign, with subjective worsening, data reviewed with pt, now for abd /pelvic CT  Pelvic pain Etiology unclear, exam benign, with subjective worsening, data reviewed with pt, now for abd /pelvic CT, also consider f/u with GYN  Hyperglycemia Lab Results  Component Value Date   HGBA1C 5.6 01/21/2021   Stable, pt to continue current medical treatment  - diet   Essential hypertension BP Readings from Last 3 Encounters:  01/28/21 120/68  01/28/20 118/76  10/09/19 130/70   Stable, pt to continue medical treatment  - low salt diet, wt control   Hyperlipidemia Lab Results  Component Value  Date   [place] 85 01/21/2021   Stable, pt to continue current statin crestor 10   Current Outpatient Medications (Cardiovascular):  .  rosuvastatin (CRESTOR) 10 MG tablet, 1 tab by mouth every other day  Current Outpatient Medications (Respiratory):  .  albuterol (PROVENTIL HFA;VENTOLIN HFA) 108 (90 Base) MCG/ACT inhaler, Inhale 2 puffs into the lungs every 6 (six) hours as needed for wheezing or shortness of breath. .  fluticasone (FLONASE) 50 MCG/ACT nasal spray, Place 2 sprays into both nostrils daily.  Current Outpatient Medications (Analgesics):  .  ibuprofen (ADVIL,MOTRIN) 200 MG tablet, Take 400 mg by mouth every 6 (six) hours as needed for moderate pain. .  meloxicam (MOBIC) 15 MG tablet, Take 1 tablet (15 mg total) by mouth daily.   Current Outpatient Medications (Other):  Marland Kitchen  Diclofenac Sodium (PENNSAID) 2 % SOLN, Place 2 application onto the skin 2 (two) times daily. .  Estradiol (VAGIFEM) 10 MCG TABS vaginal tablet, Place 1 tablet (10 mcg total) vaginally 2 (two) times a week. .  gabapentin (NEURONTIN) 100 MG capsule, Take 2 capsules (200 mg total) by mouth at bedtime. Marland Kitchen  tiZANidine (ZANAFLEX) 4 MG capsule, TAKE 1 CAPSULE BY MOUTH EVERY EVENING .  zolpidem (AMBIEN) 10 MG tablet, Take 1 tablet (10 mg total) by mouth at bedtime as needed. for sleep    Insomnia, unspecified Stable, continue ambien qhs prn  Followup: Return in about 1 year (around 01/28/2022).  Cathlean Cower, MD 02/01/2021 7:06 PM [facility]

## 2021-02-01 ENCOUNTER — Encounter: Payer: Self-pay | Admitting: Internal Medicine

## 2021-02-01 DIAGNOSIS — R102 Pelvic and perineal pain: Secondary | ICD-10-CM | POA: Insufficient documentation

## 2021-02-01 DIAGNOSIS — R1030 Lower abdominal pain, unspecified: Secondary | ICD-10-CM | POA: Insufficient documentation

## 2021-02-01 NOTE — Assessment & Plan Note (Signed)
Lab Results  Component Value Date   HGBA1C 5.6 01/21/2021   Stable, pt to continue current medical treatment  - diet

## 2021-02-01 NOTE — Assessment & Plan Note (Signed)
Etiology unclear, exam benign, with subjective worsening, data reviewed with pt, now for abd /pelvic CT

## 2021-02-01 NOTE — Assessment & Plan Note (Signed)
Stable, continue ambien qhs prn

## 2021-02-01 NOTE — Assessment & Plan Note (Signed)
Etiology unclear, exam benign, with subjective worsening, data reviewed with pt, now for abd /pelvic CT, also consider f/u with GYN

## 2021-02-01 NOTE — Assessment & Plan Note (Signed)
Lab Results  Component Value Date   LDLCALC 85 01/21/2021   Stable, pt to continue current statin crestor 10   Current Outpatient Medications (Cardiovascular):  .  rosuvastatin (CRESTOR) 10 MG tablet, 1 tab by mouth every other day  Current Outpatient Medications (Respiratory):  .  albuterol (PROVENTIL HFA;VENTOLIN HFA) 108 (90 Base) MCG/ACT inhaler, Inhale 2 puffs into the lungs every 6 (six) hours as needed for wheezing or shortness of breath. .  fluticasone (FLONASE) 50 MCG/ACT nasal spray, Place 2 sprays into both nostrils daily.  Current Outpatient Medications (Analgesics):  .  ibuprofen (ADVIL,MOTRIN) 200 MG tablet, Take 400 mg by mouth every 6 (six) hours as needed for moderate pain. .  meloxicam (MOBIC) 15 MG tablet, Take 1 tablet (15 mg total) by mouth daily.   Current Outpatient Medications (Other):  Marland Kitchen  Diclofenac Sodium (PENNSAID) 2 % SOLN, Place 2 application onto the skin 2 (two) times daily. .  Estradiol (VAGIFEM) 10 MCG TABS vaginal tablet, Place 1 tablet (10 mcg total) vaginally 2 (two) times a week. .  gabapentin (NEURONTIN) 100 MG capsule, Take 2 capsules (200 mg total) by mouth at bedtime. Marland Kitchen  tiZANidine (ZANAFLEX) 4 MG capsule, TAKE 1 CAPSULE BY MOUTH EVERY EVENING .  zolpidem (AMBIEN) 10 MG tablet, Take 1 tablet (10 mg total) by mouth at bedtime as needed. for sleep

## 2021-02-01 NOTE — Assessment & Plan Note (Signed)
BP Readings from Last 3 Encounters:  01/28/21 120/68  01/28/20 118/76  10/09/19 130/70   Stable, pt to continue medical treatment  - low salt diet, wt control

## 2021-02-01 NOTE — Assessment & Plan Note (Signed)

## 2021-02-04 ENCOUNTER — Telehealth: Payer: Self-pay | Admitting: Internal Medicine

## 2021-02-04 ENCOUNTER — Other Ambulatory Visit: Payer: Self-pay

## 2021-02-04 ENCOUNTER — Ambulatory Visit (INDEPENDENT_AMBULATORY_CARE_PROVIDER_SITE_OTHER): Payer: PRIVATE HEALTH INSURANCE | Admitting: Psychologist

## 2021-02-04 DIAGNOSIS — F330 Major depressive disorder, recurrent, mild: Secondary | ICD-10-CM | POA: Diagnosis not present

## 2021-02-04 MED ORDER — ROSUVASTATIN CALCIUM 10 MG PO TABS: ORAL_TABLET | ORAL | 3 refills | Status: DC

## 2021-02-04 NOTE — Telephone Encounter (Signed)
Pt requesting her rosuvastatin (CRESTOR) 10 MG tablet [[id number]]  To be a 90 day prescription w/ refills and sent to [facility] on [address].

## 2021-03-04 ENCOUNTER — Other Ambulatory Visit: Payer: Self-pay

## 2021-03-04 ENCOUNTER — Encounter: Payer: Self-pay | Admitting: Internal Medicine

## 2021-03-04 ENCOUNTER — Ambulatory Visit
Admission: RE | Admit: 2021-03-04 | Discharge: 2021-03-04 | Disposition: A | Discharge status: Home / Self Care | Payer: 59 | Source: Ambulatory Visit | Attending: Internal Medicine | Admitting: Internal Medicine

## 2021-03-04 DIAGNOSIS — I700 Atherosclerosis of aorta: Secondary | ICD-10-CM | POA: Insufficient documentation

## 2021-03-04 DIAGNOSIS — R1030 Lower abdominal pain, unspecified: Secondary | ICD-10-CM

## 2021-03-04 DIAGNOSIS — R102 Pelvic and perineal pain: Secondary | ICD-10-CM

## 2021-03-04 MED ORDER — IOPAMIDOL (ISOVUE-300) INJECTION 61%
100.0000 mL | Freq: Once | INTRAVENOUS | Status: AC | PRN
  Administered 2021-03-04: 100 mL via INTRAVENOUS

## 2021-03-05 ENCOUNTER — Ambulatory Visit: Payer: PRIVATE HEALTH INSURANCE | Admitting: Psychologist

## 2021-06-08 ENCOUNTER — Encounter: Payer: Self-pay | Admitting: Internal Medicine

## 2022-01-08 ENCOUNTER — Telehealth: Payer: Self-pay | Admitting: Internal Medicine

## 2022-01-08 DIAGNOSIS — E559 Vitamin D deficiency, unspecified: Secondary | ICD-10-CM

## 2022-01-08 DIAGNOSIS — E7849 Other hyperlipidemia: Secondary | ICD-10-CM

## 2022-01-08 DIAGNOSIS — E538 Deficiency of other specified B group vitamins: Secondary | ICD-10-CM

## 2022-01-08 DIAGNOSIS — R739 Hyperglycemia, unspecified: Secondary | ICD-10-CM

## 2022-01-08 NOTE — Telephone Encounter (Signed)
Pt has lab appt 2/3 CPE with PCP 2/7  Please enter lab orders.

## 2022-01-08 NOTE — Telephone Encounter (Signed)
Ok labs ordered 

## 2022-01-29 ENCOUNTER — Other Ambulatory Visit (INDEPENDENT_AMBULATORY_CARE_PROVIDER_SITE_OTHER): Payer: 59

## 2022-01-29 ENCOUNTER — Other Ambulatory Visit: Payer: Self-pay

## 2022-01-29 DIAGNOSIS — E559 Vitamin D deficiency, unspecified: Secondary | ICD-10-CM

## 2022-01-29 DIAGNOSIS — R739 Hyperglycemia, unspecified: Secondary | ICD-10-CM | POA: Diagnosis not present

## 2022-01-29 DIAGNOSIS — E7849 Other hyperlipidemia: Secondary | ICD-10-CM | POA: Diagnosis not present

## 2022-01-29 DIAGNOSIS — E538 Deficiency of other specified B group vitamins: Secondary | ICD-10-CM

## 2022-01-29 LAB — LIPID PANEL
Cholesterol: 157 mg/dL (ref 0–200)
HDL: 51.4 mg/dL (ref >39.00)
LDL Cholesterol: 85 mg/dL (ref 0–99)
NonHDL: 105.34
Total CHOL/HDL Ratio: 3
Triglycerides: 101 mg/dL (ref 0.0–149.0)
VLDL: 20.2 mg/dL (ref 0.0–40.0)

## 2022-01-29 LAB — HEPATIC FUNCTION PANEL
ALT: 16 U/L (ref 0–35)
AST: 19 U/L (ref 0–37)
Albumin: 4 g/dL (ref 3.5–5.2)
Alkaline Phosphatase: 59 U/L (ref 39–117)
Bilirubin, Direct: 0 mg/dL (ref 0.0–0.3)
Total Bilirubin: 0.4 mg/dL (ref 0.2–1.2)
Total Protein: 6.6 g/dL (ref 6.0–8.3)

## 2022-01-29 LAB — BASIC METABOLIC PANEL
BUN: 17 mg/dL (ref 6–23)
CO2: 28 mEq/L (ref 19–32)
Calcium: 9.2 mg/dL (ref 8.4–10.5)
Chloride: 104 mEq/L (ref 96–112)
Creatinine, Ser: 1.12 mg/dL (ref 0.40–1.20)
GFR: 52.38 mL/min — ABNORMAL LOW (ref >60.00)
Glucose, Bld: 91 mg/dL (ref 70–99)
Potassium: 4.3 mEq/L (ref 3.5–5.1)
Sodium: 137 mEq/L (ref 135–145)

## 2022-01-29 LAB — CBC WITH DIFFERENTIAL/PLATELET
Basophils Absolute: 0 10*3/uL (ref 0.0–0.1)
Basophils Relative: 0.6 % (ref 0.0–3.0)
Eosinophils Absolute: 0.1 10*3/uL (ref 0.0–0.7)
Eosinophils Relative: 1.4 % (ref 0.0–5.0)
HCT: 39.4 % (ref 36.0–46.0)
Hemoglobin: 12.8 g/dL (ref 12.0–15.0)
Lymphocytes Relative: 52.1 % — ABNORMAL HIGH (ref 12.0–46.0)
Lymphs Abs: 3.3 10*3/uL (ref 0.7–4.0)
MCHC: 32.4 g/dL (ref 30.0–36.0)
MCV: 85.8 fl (ref 78.0–100.0)
Monocytes Absolute: 0.4 10*3/uL (ref 0.1–1.0)
Monocytes Relative: 6.9 % (ref 3.0–12.0)
Neutro Abs: 2.5 10*3/uL (ref 1.4–7.7)
Neutrophils Relative %: 39 % — ABNORMAL LOW (ref 43.0–77.0)
Platelets: 206 10*3/uL (ref 150.0–400.0)
RBC: 4.59 Mil/uL (ref 3.87–5.11)
RDW: 14.3 % (ref 11.5–15.5)
WBC: 6.4 10*3/uL (ref 4.0–10.5)

## 2022-01-29 LAB — VITAMIN D 25 HYDROXY (VIT D DEFICIENCY, FRACTURES): VITD: 34.9 ng/mL (ref 30.00–100.00)

## 2022-01-29 LAB — VITAMIN B12: Vitamin B-12: 873 pg/mL (ref 211–911)

## 2022-01-29 LAB — TSH: TSH: 3.23 u[IU]/mL (ref 0.35–5.50)

## 2022-01-29 LAB — HEMOGLOBIN A1C: Hgb A1c MFr Bld: 6.1 % (ref 4.6–6.5)

## 2022-02-01 ENCOUNTER — Other Ambulatory Visit: Payer: 59

## 2022-02-01 LAB — URINALYSIS, ROUTINE W REFLEX MICROSCOPIC
Bilirubin Urine: NEGATIVE
Hgb urine dipstick: NEGATIVE
Ketones, ur: NEGATIVE
Leukocytes,Ua: NEGATIVE
Nitrite: NEGATIVE
Specific Gravity, Urine: 1.02 (ref 1.000–1.030)
Total Protein, Urine: NEGATIVE
Urine Glucose: NEGATIVE
Urobilinogen, UA: 0.2 (ref 0.0–1.0)
pH: 5.5 (ref 5.0–8.0)

## 2022-02-02 ENCOUNTER — Encounter: Payer: Self-pay | Admitting: Internal Medicine

## 2022-02-02 ENCOUNTER — Ambulatory Visit (INDEPENDENT_AMBULATORY_CARE_PROVIDER_SITE_OTHER): Payer: 59 | Admitting: Internal Medicine

## 2022-02-02 ENCOUNTER — Other Ambulatory Visit: Payer: Self-pay

## 2022-02-02 VITALS — BP 112/80 | HR 56 | Temp 98.1°F | Ht 63.0 in | Wt 168.6 lb

## 2022-02-02 DIAGNOSIS — N1831 Chronic kidney disease, stage 3a: Secondary | ICD-10-CM

## 2022-02-02 DIAGNOSIS — Z0001 Encounter for general adult medical examination with abnormal findings: Secondary | ICD-10-CM

## 2022-02-02 DIAGNOSIS — E559 Vitamin D deficiency, unspecified: Secondary | ICD-10-CM | POA: Diagnosis not present

## 2022-02-02 DIAGNOSIS — E7800 Pure hypercholesterolemia, unspecified: Secondary | ICD-10-CM | POA: Diagnosis not present

## 2022-02-02 DIAGNOSIS — Z803 Family history of malignant neoplasm of breast: Secondary | ICD-10-CM

## 2022-02-02 DIAGNOSIS — R739 Hyperglycemia, unspecified: Secondary | ICD-10-CM | POA: Diagnosis not present

## 2022-02-02 DIAGNOSIS — I10 Essential (primary) hypertension: Secondary | ICD-10-CM

## 2022-02-02 DIAGNOSIS — R002 Palpitations: Secondary | ICD-10-CM

## 2022-02-02 DIAGNOSIS — I700 Atherosclerosis of aorta: Secondary | ICD-10-CM

## 2022-02-02 DIAGNOSIS — L989 Disorder of the skin and subcutaneous tissue, unspecified: Secondary | ICD-10-CM

## 2022-02-02 DIAGNOSIS — M7122 Synovial cyst of popliteal space [Baker], left knee: Secondary | ICD-10-CM | POA: Diagnosis not present

## 2022-02-02 MED ORDER — ZOLPIDEM TARTRATE 10 MG PO TABS: 10.0000 mg | ORAL_TABLET | Freq: Every evening | ORAL | 1 refills | Status: DC | PRN

## 2022-02-02 MED ORDER — ESTRADIOL 10 MCG VA TABS: 10.0000 ug | ORAL_TABLET | VAGINAL | 3 refills | Status: AC

## 2022-02-02 MED ORDER — ROSUVASTATIN CALCIUM 10 MG PO TABS
ORAL_TABLET | ORAL | 3 refills | Status: DC
Start: 2022-02-02 — End: 2022-03-23

## 2022-02-02 MED ORDER — ALBUTEROL SULFATE HFA 108 (90 BASE) MCG/ACT IN AERS: 2.0000 | INHALATION_SPRAY | Freq: Four times a day (QID) | RESPIRATORY_TRACT | 1 refills | Status: DC | PRN

## 2022-02-02 NOTE — Assessment & Plan Note (Signed)
Lab Results  Component Value Date   CREATININE 1.12 01/29/2022   Stable overall, cont to avoid nephrotoxins, for renal u/s

## 2022-02-02 NOTE — Assessment & Plan Note (Signed)
For cardiology referral per pt request

## 2022-02-02 NOTE — Assessment & Plan Note (Signed)
Mild to mod, for sport med referral

## 2022-02-02 NOTE — Patient Instructions (Signed)
Ok to double the [place] to take the statin every day, with the goal LDL being to be less than 70  Please continue all other medications as before, and refills have been done if requested.  Please have the pharmacy call with any other refills you may need.  Please continue your efforts at being more active, low cholesterol diet, and weight control.  You are otherwise up to date with prevention measures today.  Please keep your appointments with your specialists as you may have planned  You will be contacted regarding the referral for: kidney ultrasound, genetic counseling, Dr Tamala Julian for the left knee, and cardiology  Please make an Appointment to return for your 1 year visit, or sooner if needed, with Lab testing by Appointment as well, to be done about 3-5 days before at the [facility] (so this is for TWO appointments - please see the scheduling desk as you leave)  Due to the ongoing Covid 19 pandemic, our lab now requires an appointment for any labs done at our office.  If you need labs done and do not have an appointment, please call our office ahead of time to schedule before presenting to the lab for your testing.

## 2022-02-02 NOTE — Assessment & Plan Note (Signed)
Pt to continue crestor, low chol diet, excercise

## 2022-02-02 NOTE — Assessment & Plan Note (Signed)
Age and sex appropriate education and counseling updated with regular exercise and diet Referrals for preventative services - none needed Immunizations addressed - declines tdap Smoking counseling  - none needed Evidence for depression or other mood disorder - none significant Most recent labs reviewed. I have personally reviewed and have noted: 1) the patient's medical and social history 2) The patient's current medications and supplements 3) The patient's height, weight, and BMI have been recorded in the chart  

## 2022-02-02 NOTE — Assessment & Plan Note (Signed)
Last vitamin D Lab Results  Component Value Date   VD25OH 34.90 01/29/2022   Low, to start oral replacement

## 2022-02-02 NOTE — Assessment & Plan Note (Signed)
Lab Results  Component Value Date   LDLCALC 85 01/29/2022   Uncontrolled, goal ldl < 70, pt to continue current statin crestor but increased from 3 times weekly to daily for better LDL control   Current Outpatient Medications (Cardiovascular):    rosuvastatin (CRESTOR) 10 MG tablet, 1 tab by mouth every other day  Current Outpatient Medications (Respiratory):    albuterol (PROVENTIL HFA;VENTOLIN HFA) 108 (90 Base) MCG/ACT inhaler, Inhale 2 puffs into the lungs every 6 (six) hours as needed for wheezing or shortness of breath.   fluticasone (FLONASE) 50 MCG/ACT nasal spray, Place 2 sprays into both nostrils daily. (Patient not taking: Reported on 02/02/2022)  Current Outpatient Medications (Analgesics):    ibuprofen (ADVIL,MOTRIN) 200 MG tablet, Take 400 mg by mouth every 6 (six) hours as needed for moderate pain.   meloxicam (MOBIC) 15 MG tablet, Take 1 tablet (15 mg total) by mouth daily. (Patient not taking: Reported on 02/02/2022)   Current Outpatient Medications (Other):    Estradiol (VAGIFEM) 10 MCG TABS vaginal tablet, Place 1 tablet (10 mcg total) vaginally 2 (two) times a week.   zolpidem (AMBIEN) 10 MG tablet, Take 1 tablet (10 mg total) by mouth at bedtime as needed. for sleep   gabapentin (NEURONTIN) 100 MG capsule, Take 2 capsules (200 mg total) by mouth at bedtime. (Patient not taking: Reported on 02/02/2022)

## 2022-02-02 NOTE — Assessment & Plan Note (Signed)
BP Readings from Last 3 Encounters:  02/02/22 112/80  01/28/21 120/68  01/28/20 118/76   Stable, pt to continue medical treatment - low salt diet, wt contrl

## 2022-02-02 NOTE — Assessment & Plan Note (Signed)
Lab Results  Component Value Date   HGBA1C 6.1 01/29/2022   Stable, pt to continue current medical treatment  - diet

## 2022-02-02 NOTE — Progress Notes (Signed)
Patient ID: Annette [name], female   DOB: Dec 21, 1956, 66 y.o.   MRN: [id number]         Chief Complaint:: wellness exam and palpitations, low vit d, CKD3 recent onset, FH breast CA, hld, left knee bakers cyst       HPI:  Annette [name] is a 66 y.o. female here for wellness exam; declines tdap, o/w up to date                        Also Pt denies chest pain, increased sob or doe, wheezing, orthopnea, PND, increased LE swelling, dizziness or syncope, but has intemittent palpitaitons asking for cardiology referral.  Not taking Vit d.  Has very mild ckd 3 verified this visit, asking for renal imaging.  Also has several first degree relatives with hx of breast ca - asking for genetic counseling referral.  Trying to follow lower chol diet, only taking statin 3 times per wk, hesitant to take more often though no myalgias or other side effects wondering if this is enough.  Has hx of aortic atherosclerosis on imaging prior abd/pelvis CT. Also has > 3 mo worsening post left knee pain and swelling to the back of the knee, worse to stand and walk too long, better with sitting and rest though sitting with knee flexed can be more painful than extneded.   Pt denies polydipsia, polyuria, or new focal neuro s/s.   Pt denies fever, wt loss, night sweats, loss of appetite, or other constitutional symptoms  Wt Readings from Last 3 Encounters:  02/02/22 168 lb 9.6 oz (76.5 kg)  01/28/21 168 lb (76.2 kg)  01/28/20 169 lb 12.8 oz (77 kg)   BP Readings from Last 3 Encounters:  02/02/22 112/80  01/28/21 120/68  01/28/20 118/76   Immunization History  Administered Date(s) Administered   Influenza Inj Mdck Quad With Preservative 09/27/2019   Influenza Split 16-Feb-1956   Influenza Whole 10/08/2008   Influenza, Quadrivalent, Recombinant, Inj, Pf 10/05/2019   Influenza, Seasonal, Injecte, Preservative Fre 09/26/2013   Influenza,inj,Quad PF,6+ Mos 09/26/2021   Influenza-Unspecified 09/27/2015, 10/26/2015, 09/28/2016,  09/26/2018, 08/27/2020, 09/29/2020   PFIZER Comirnaty(Gray Top)Covid-19 Tri-Sucrose Vaccine 03/08/2020, 03/30/2020, 10/30/2020   Pfizer Covid-19 Vaccine Bivalent Booster 63yrs & up 09/09/2021   Td 11/10/2010   Zoster Recombinat (Shingrix) 02/10/2018, 04/14/2018   There are no preventive care reminders to display for this patient.     Past Medical History:  Diagnosis Date   ALLERGIC RHINITIS 10/01/2007   ANXIETY 11/10/2010   ATTENTION DEFICIT DISORDER 11/10/2010   BARTHOLIN'S CYST 08/20/2007   Carpal tunnel syndrome 08/20/2007   COLONIC POLYPS, HX OF 08/20/2007   DEPRESSION 08/20/2007   EMPHYSEMA 06/16/2010   HOT FLASHES 09/26/2009   HYPERLIPIDEMIA 08/20/2007   HYPERTENSION 08/20/2007   Insomnia, unspecified 10/01/2007   ISCHEMIC COLITIS, HX OF 10/01/2007   MENOPAUSAL DISORDER 11/07/2009   OBESITY, MILD 08/20/2007   OSTEOPENIA 06/16/2010   POLYARTHRALGIA 11/06/2008   Raynaud's syndrome 08/20/2007   Past Surgical History:  Procedure Laterality Date   TONSILLECTOMY      reports that she quit smoking about 31 years ago. Her smoking use included cigarettes. She has never used smokeless tobacco. She reports current alcohol use. She reports that she does not use drugs. family history includes ADD / ADHD in her daughter; Breast cancer in her maternal grandmother; Coronary artery disease in an other family member; Hypertension in an other family member; Osteoporosis in her maternal grandmother and mother;  Prostate cancer in an other family member. No Known Allergies Current Outpatient Medications on File Prior to Visit  Medication Sig Dispense Refill   albuterol (PROVENTIL HFA;VENTOLIN HFA) 108 (90 Base) MCG/ACT inhaler Inhale 2 puffs into the lungs every 6 (six) hours as needed for wheezing or shortness of breath. 1 Inhaler 2   Estradiol (VAGIFEM) 10 MCG TABS vaginal tablet Place 1 tablet (10 mcg total) vaginally 2 (two) times a week. 24 tablet 3   ibuprofen (ADVIL,MOTRIN) 200 MG tablet Take 400  mg by mouth every 6 (six) hours as needed for moderate pain.     rosuvastatin (CRESTOR) 10 MG tablet 1 tab by mouth every other day 90 tablet 3   zolpidem (AMBIEN) 10 MG tablet Take 1 tablet (10 mg total) by mouth at bedtime as needed. for sleep 90 tablet 1   fluticasone (FLONASE) 50 MCG/ACT nasal spray Place 2 sprays into both nostrils daily. (Patient not taking: Reported on 02/02/2022) 16 g 6   gabapentin (NEURONTIN) 100 MG capsule Take 2 capsules (200 mg total) by mouth at bedtime. (Patient not taking: Reported on 02/02/2022) 180 capsule 3   meloxicam (MOBIC) 15 MG tablet Take 1 tablet (15 mg total) by mouth daily. (Patient not taking: Reported on 02/02/2022) 90 tablet 3   No current facility-administered medications on file prior to visit.        ROS:  All others reviewed and negative.  Objective        PE:  BP 112/80 (BP Location: Right Arm, Patient Position: Sitting, Cuff Size: Large)    Pulse (!) 56    Temp 98.1 F (36.7 C) (Oral)    Ht 5\' 3"  (1.6 m)    Wt 168 lb 9.6 oz (76.5 kg)    SpO2 99%    BMI 29.87 kg/m                 Constitutional: Pt appears in NAD               HENT: Head: NCAT.                Right Ear: External ear normal.                 Left Ear: External ear normal.                Eyes: . Pupils are equal, round, and reactive to light. Conjunctivae and EOM are normal               Nose: without d/c or deformity               Neck: Neck supple. Gross normal ROM               Cardiovascular: Normal rate and regular rhythm.                 Pulmonary/Chest: Effort normal and breath sounds without rales or wheezing.                Abd:  Soft, NT, ND, + BS, no organomegaly               Neurological: Pt is alert. At baseline orientation, motor grossly intact               Skin: Skin is warm. No rashes, no other new lesions, LE edema - none               Left post knee with less  than egg sized soft mass to the popliteal fossa               Psychiatric: Pt behavior is normal  without agitation   Micro: none  Cardiac tracings I have personally interpreted today:  none  Pertinent Radiological findings (summarize): none   Lab Results  Component Value Date   WBC 6.4 01/29/2022   HGB 12.8 01/29/2022   HCT 39.4 01/29/2022   PLT 206.0 01/29/2022   GLUCOSE 91 01/29/2022   CHOL 157 01/29/2022   TRIG 101.0 01/29/2022   HDL 51.40 01/29/2022   LDLDIRECT 137.0 12/11/2013   LDLCALC 85 01/29/2022   ALT 16 01/29/2022   AST 19 01/29/2022   NA 137 01/29/2022   K 4.3 01/29/2022   CL 104 01/29/2022   CREATININE 1.12 01/29/2022   BUN 17 01/29/2022   CO2 28 01/29/2022   TSH 3.23 01/29/2022   HGBA1C 6.1 01/29/2022   Assessment/Plan:  Annette [name] is a 66 y.o. Black or African American [2] female with  has a past medical history of ALLERGIC RHINITIS (10/01/2007), ANXIETY (11/10/2010), ATTENTION DEFICIT DISORDER (11/10/2010), BARTHOLIN'S CYST (08/20/2007), Carpal tunnel syndrome (08/20/2007), COLONIC POLYPS, HX OF (08/20/2007), DEPRESSION (08/20/2007), EMPHYSEMA (06/16/2010), HOT FLASHES (09/26/2009), HYPERLIPIDEMIA (08/20/2007), HYPERTENSION (08/20/2007), Insomnia, unspecified (10/01/2007), ISCHEMIC COLITIS, HX OF (10/01/2007), MENOPAUSAL DISORDER (11/07/2009), OBESITY, MILD (08/20/2007), OSTEOPENIA (06/16/2010), POLYARTHRALGIA (11/06/2008), and Raynaud's syndrome (08/20/2007).  Vitamin D deficiency Last vitamin D Lab Results  Component Value Date   VD25OH 34.90 01/29/2022   Low, to start oral replacement   Encounter for well adult exam with abnormal findings Age and sex appropriate education and counseling updated with regular exercise and diet Referrals for preventative services - none needed Immunizations addressed - declines tdap Smoking counseling  - none needed Evidence for depression or other mood disorder - none significant Most recent labs reviewed. I have personally reviewed and have noted: 1) the patient's medical and social history 2) The patient's current  medications and supplements 3) The patient's height, weight, and BMI have been recorded in the chart   Essential hypertension BP Readings from Last 3 Encounters:  02/02/22 112/80  01/28/21 120/68  01/28/20 118/76   Stable, pt to continue medical treatment - low salt diet, wt contrl   Hyperglycemia Lab Results  Component Value Date   HGBA1C 6.1 01/29/2022   Stable, pt to continue current medical treatment  - diet   Hyperlipidemia Lab Results  Component Value Date   LDLCALC 85 01/29/2022   Uncontrolled, goal ldl < 70, pt to continue current statin crestor but increased from 3 times weekly to daily for better LDL control   Current Outpatient Medications (Cardiovascular):    rosuvastatin (CRESTOR) 10 MG tablet, 1 tab by mouth every other day  Current Outpatient Medications (Respiratory):    albuterol (PROVENTIL HFA;VENTOLIN HFA) 108 (90 Base) MCG/ACT inhaler, Inhale 2 puffs into the lungs every 6 (six) hours as needed for wheezing or shortness of breath.   fluticasone (FLONASE) 50 MCG/ACT nasal spray, Place 2 sprays into both nostrils daily. (Patient not taking: Reported on 02/02/2022)  Current Outpatient Medications (Analgesics):    ibuprofen (ADVIL,MOTRIN) 200 MG tablet, Take 400 mg by mouth every 6 (six) hours as needed for moderate pain.   meloxicam (MOBIC) 15 MG tablet, Take 1 tablet (15 mg total) by mouth daily. (Patient not taking: Reported on 02/02/2022)   Current Outpatient Medications (Other):    Estradiol (VAGIFEM) 10 MCG TABS vaginal tablet, Place 1 tablet (10 mcg total) vaginally  2 (two) times a week.   zolpidem (AMBIEN) 10 MG tablet, Take 1 tablet (10 mg total) by mouth at bedtime as needed. for sleep   gabapentin (NEURONTIN) 100 MG capsule, Take 2 capsules (200 mg total) by mouth at bedtime. (Patient not taking: Reported on 02/02/2022)   Aortic atherosclerosis (HCC) Pt to continue crestor, low chol diet, excercise  Baker's cyst of knee, left Mild to mod, for  sport med referral  Family history of breast cancer For genetic counseling  Palpitations For cardiology referral per pt request  Stage 3a chronic kidney disease ([place]) Lab Results  Component Value Date   CREATININE 1.12 01/29/2022   Stable overall, cont to avoid nephrotoxins, for renal u/s  Followup: Return in about 1 year (around 02/02/2023).  Cathlean Cower, MD 02/02/2022 9:16 PM [facility]

## 2022-02-02 NOTE — Assessment & Plan Note (Signed)
For genetic counseling

## 2022-02-03 NOTE — Progress Notes (Signed)
Cardiology Office Note:    Date:  02/05/2022   ID:  Annette [name], DOB 12/02/1956, MRN [id number]  PCP:  Biagio Borg, MD   [name] Cardiologist:  Lenna Sciara, MD Referring MD: Biagio Borg, MD   Chief Complaint/Reason for Referral: Palpitations  ASSESSMENT:    Palpitations  Aortic atherosclerosis ([place])  Snoring    PLAN:    In order of problems listed above:  1.  We will obtain monitor and echocardiogram to evaluate further.  We will keep follow-up open-ended depending on these results.  2.  Recent LDL was 85.  LDL goal < 70.  Had been on a different statin and could not tolerate, will refer to pharmacy for further recommendations.  Will start taking Crestor daily.  Had only been taking it 3 days a week.  We will check lipid panel and LFTs in 6 weeks and have the patient see pharmacy thereafter for further recommendations in case she cannot tolerate Crestor.  3.  We will obtain sleep apnea evaluation.             Dispo:  No follow-ups on file.     Medication Adjustments/Labs and Tests Ordered: Current medicines are reviewed at length with the patient today.  Concerns regarding medicines are outlined above.   Tests Ordered: No orders of the defined types were placed in this encounter.   Medication Changes: No orders of the defined types were placed in this encounter.   History of Present Illness:    FOCUSED CARDIOVASCULAR PROBLEM LIST:   1.  Aortic atherosclerosis on CT scan  The patient is a 66 y.o. female with the indicated medical history here for recommendations regarding palpitations.  The patient saw her primary care provider recently.  She reported palpitations and asked for cardiology referral.  She reported some left knee pain.  Labs were drawn including TSH were within normal limits.  The patient tells me that over the last 6 months or so she has had palpitations maybe 5 times.  She exercise on a regular basis about 2 or 3  times a week doing spinning classes for about 40 minutes.  She denies any chest pain or shortness of breath with any of this.  She tells me on occasion she will have the abrupt onset of palpitations that can last maybe 5 minutes.  They are not associate with shortness of breath or chest pain.  They are just bothersome.  She has had no peripheral edema, paroxysmal nocturnal dyspnea, orthopnea.  She is required no emergency room visits or hospitalizations.  She does have a moderate degree of stress in her life but she does not feel that this has impacted her palpitations whatsoever.  She does drink coffee on a regular basis.  Of note she does snore and does report daytime somnolence.  She has never been tested for sleep apnea.        Previous Medical History: Past Medical History:  Diagnosis Date   ALLERGIC RHINITIS 10/01/2007   ANXIETY 11/10/2010   ATTENTION DEFICIT DISORDER 11/10/2010   BARTHOLIN'S CYST 08/20/2007   Carpal tunnel syndrome 08/20/2007   COLONIC POLYPS, HX OF 08/20/2007   DEPRESSION 08/20/2007   EMPHYSEMA 06/16/2010   HOT FLASHES 09/26/2009   HYPERLIPIDEMIA 08/20/2007   HYPERTENSION 08/20/2007   Insomnia, unspecified 10/01/2007   ISCHEMIC COLITIS, HX OF 10/01/2007   MENOPAUSAL DISORDER 11/07/2009   OBESITY, MILD 08/20/2007   OSTEOPENIA 06/16/2010   POLYARTHRALGIA 11/06/2008   Raynaud's syndrome 08/20/2007  Current Medications: Current Meds  Medication Sig   albuterol (VENTOLIN HFA) 108 (90 Base) MCG/ACT inhaler Inhale 2 puffs into the lungs every 6 (six) hours as needed for wheezing or shortness of breath.   Estradiol (VAGIFEM) 10 MCG TABS vaginal tablet Place 1 tablet (10 mcg total) vaginally 2 (two) times a week.   fluticasone (FLONASE) 50 MCG/ACT nasal spray Place 2 sprays into both nostrils daily.   gabapentin (NEURONTIN) 100 MG capsule Take 2 capsules (200 mg total) by mouth at bedtime.   ibuprofen (ADVIL,MOTRIN) 200 MG tablet Take 400 mg by mouth every 6 (six) hours as  needed for moderate pain.   meloxicam (MOBIC) 15 MG tablet Take 1 tablet (15 mg total) by mouth daily.   rosuvastatin (CRESTOR) 10 MG tablet 1 tab by mouth every other day   zolpidem (AMBIEN) 10 MG tablet Take 1 tablet (10 mg total) by mouth at bedtime as needed. for sleep     Allergies:    Patient has no known allergies.   Social History:   Social History   Tobacco Use   Smoking status: Former    Types: Cigarettes    Quit date: 12/27/1990    Years since quitting: 31.1   Smokeless tobacco: Never  Substance Use Topics   Alcohol use: Yes    Comment: one drink per day   Drug use: No     Family Hx: Family History  Problem Relation Age of Onset   Osteoporosis Mother    ADD / ADHD Daughter    Osteoporosis Maternal Grandmother    Breast cancer Maternal Grandmother    Coronary artery disease Other    Hypertension Other    Prostate cancer Other    Colon cancer Neg Hx      Review of Systems:   Please see the history of present illness.    All other systems reviewed and are negative.     EKGs/Labs/Other Test Reviewed:    EKG: Sinus rhythm  Prior CV studies: None available  Imaging studies that I have independently reviewed today:   CT abd 2022:   1. No acute abdominopelvic abnormality. 2. Multiple small uterine fibroids.  3. Aortic Atherosclerosis (ICD10-I70.0).  Recent Labs: 01/29/2022: ALT 16; BUN 17; Creatinine, Ser 1.12; Hemoglobin 12.8; Platelets 206.0; Potassium 4.3; Sodium 137; TSH 3.23   Recent Lipid Panel Lab Results  Component Value Date/Time   CHOL 157 01/29/2022 08:15 AM   TRIG 101.0 01/29/2022 08:15 AM   HDL 51.40 01/29/2022 08:15 AM   LDLCALC 85 01/29/2022 08:15 AM   LDLDIRECT 137.0 12/11/2013 09:56 AM    Risk Assessment/Calculations:           Physical Exam:    VS:  BP 116/70    Pulse (!) 59    Ht 5\' 3"  (1.6 m)    Wt 168 lb (76.2 kg)    SpO2 95%    BMI 29.76 kg/m    Wt Readings from Last 3 Encounters:  02/05/22 168 lb (76.2 kg)   02/02/22 168 lb 9.6 oz (76.5 kg)  01/28/21 168 lb (76.2 kg)    GENERAL:  No apparent distress, AOx3 HEENT:  No carotid bruits, +2 carotid impulses, no scleral icterus CAR: RRR  no murmurs, gallops, rubs, or thrills RES:  Clear to auscultation bilaterally ABD:  Soft, nontender, nondistended, positive bowel sounds x 4 VASC:  +2 radial pulses, +2 carotid pulses, palpable pedal pulses NEURO:  CN 2-12 grossly intact; motor and sensory grossly intact PSYCH:  No  active depression or anxiety EXT:  No edema, ecchymosis, or cyanosis  Signed, Early Osmond, MD  02/05/2022 8:48 AM    [address], [place], [place]  [address] Phone: [phone]; Fax: [phone]   Note:  This document was prepared using Dragon voice recognition software and may include unintentional dictation errors.

## 2022-02-05 ENCOUNTER — Encounter: Payer: Self-pay | Admitting: Internal Medicine

## 2022-02-05 ENCOUNTER — Ambulatory Visit (INDEPENDENT_AMBULATORY_CARE_PROVIDER_SITE_OTHER): Payer: 59 | Admitting: Internal Medicine

## 2022-02-05 ENCOUNTER — Ambulatory Visit (INDEPENDENT_AMBULATORY_CARE_PROVIDER_SITE_OTHER): Payer: 59

## 2022-02-05 ENCOUNTER — Telehealth: Payer: Self-pay | Admitting: *Deleted

## 2022-02-05 ENCOUNTER — Other Ambulatory Visit: Payer: Self-pay

## 2022-02-05 ENCOUNTER — Other Ambulatory Visit: Payer: Self-pay | Admitting: Internal Medicine

## 2022-02-05 VITALS — BP 116/70 | HR 59 | Ht 63.0 in | Wt 168.0 lb

## 2022-02-05 DIAGNOSIS — R002 Palpitations: Secondary | ICD-10-CM

## 2022-02-05 DIAGNOSIS — I700 Atherosclerosis of aorta: Secondary | ICD-10-CM

## 2022-02-05 DIAGNOSIS — R0683 Snoring: Secondary | ICD-10-CM

## 2022-02-05 MED ORDER — ASPIRIN EC 81 MG PO TBEC: 81.0000 mg | DELAYED_RELEASE_TABLET | Freq: Every day | ORAL | 3 refills | Status: AC

## 2022-02-05 NOTE — Patient Instructions (Signed)
Medication Instructions:  Your physician has recommended you make the following change in your medication: Start aspirin 81 mg by mouth daily  *If you need a refill on your cardiac medications before your next appointment, please call your pharmacy*   Lab Work: Your physician recommends that you return for lab work in: 6 weeks.  Lipid and liver profiles.  This will be fasting  If you have labs (blood work) drawn today and your tests are completely normal, you will receive your results only by: [facility] (if you have MyChart) OR A paper copy in the mail If you have any lab test that is abnormal or we need to change your treatment, we will call you to review the results.   Testing/Procedures: Your physician has recommended that you wear a holter monitor. Holter monitors are medical devices that record the hearts electrical activity. Doctors most often use these monitors to diagnose arrhythmias. Arrhythmias are problems with the speed or rhythm of the heartbeat. The monitor is a small, portable device. You can wear one while you do your normal daily activities. This is usually used to diagnose what is causing palpitations/syncope (passing out). 2 week zio  Your physician has requested that you have an echocardiogram. Echocardiography is a painless test that uses sound waves to create images of your heart. It provides your doctor with information about the size and shape of your heart and how well your hearts chambers and valves are working. This procedure takes approximately one hour. There are no restrictions for this procedure.    Follow-Up: At [facility], you and your health needs are our priority.  As part of our continuing mission to provide you with exceptional heart care, we have created designated Provider Care Teams.  These Care Teams include your primary Cardiologist (physician) and Advanced Practice Providers (APPs -  Physician Assistants and Nurse Practitioners) who all  work together to provide you with the care you need, when you need it.  We recommend signing up for the patient portal called "MyChart".  Sign up information is provided on this After Visit Summary.  MyChart is used to connect with patients for Virtual Visits (Telemedicine).  Patients are able to view lab/test results, encounter notes, upcoming appointments, etc.  Non-urgent messages can be sent to your provider as well.   To learn more about what you can do with MyChart, go to [email or web address].    Your next appointment:   As needed  The format for your next appointment:   In Person  Provider:   { Dr Ali Lowe   Other Instructions   You have been referred to Lipid clinic in our office.  Please schedule appointment with Pharm D a few days after lab work in March      Ages Instructions  Your physician has requested you wear a ZIO patch monitor for 14 days.  This is a single patch monitor. [employer] supplies one patch monitor per enrollment. Additional stickers are not available. Please do not apply patch if you will be having a Nuclear Stress Test,  Echocardiogram, Cardiac CT, MRI, or Chest Xray during the period you would be wearing the  monitor. The patch cannot be worn during these tests. You cannot remove and re-apply the  ZIO XT patch monitor.  Your ZIO patch monitor will be mailed 3 day USPS to your address on file. It may take 3-5 days  to receive your monitor after you have been enrolled.  Once you have  received your monitor, please review the enclosed instructions. Your monitor  has already been registered assigning a specific monitor serial # to you.  Billing and Patient Assistance Program Information  We have supplied [employer] with any of your insurance information on file for billing purposes. [employer] offers a sliding scale Patient Assistance Program for patients that do not have  insurance, or whose insurance does not completely cover the  cost of the ZIO monitor.  You must apply for the Patient Assistance Program to qualify for this discounted rate.  To apply, please call [employer] at [phone], select option 4, select option 2, ask to apply for  Patient Assistance Program. Theodore Demark will ask your household income, and how many people  are in your household. They will quote your out-of-pocket cost based on that information.  [employer] will also be able to set up a 17-month, interest-free payment plan if needed.  Applying the monitor   Shave hair from upper left chest.  Hold abrader disc by orange tab. Rub abrader in 40 strokes over the upper left chest as  indicated in your monitor instructions.  Clean area with 4 enclosed alcohol pads. Let dry.  Apply patch as indicated in monitor instructions. Patch will be placed under collarbone on left  side of chest with arrow pointing upward.  Rub patch adhesive wings for 2 minutes. Remove white label marked "1". Remove the white  label marked "2". Rub patch adhesive wings for 2 additional minutes.  While looking in a mirror, press and release button in center of patch. A small green light will  flash 3-4 times. This will be your only indicator that the monitor has been turned on.  Do not shower for the first 24 hours. You may shower after the first 24 hours.  Press the button if you feel a symptom. You will hear a small click. Record Date, Time and  Symptom in the Patient Logbook.  When you are ready to remove the patch, follow instructions on the last 2 pages of Patient  Logbook. Stick patch monitor onto the last page of Patient Logbook.  Place Patient Logbook in the blue and white box. Use locking tab on box and tape box closed  securely. The blue and white box has prepaid postage on it. Please place it in the mailbox as  soon as possible. Your physician should have your test results approximately 7 days after the  monitor has been mailed back to [facility].  Call [place] at [phone] if you have questions regarding  your ZIO XT patch monitor. Call them immediately if you see an orange light blinking on your  monitor.  If your monitor falls off in less than 4 days, contact our Monitor department at [phone].  If your monitor becomes loose or falls off after 4 days call [employer] at [phone] for  suggestions on securing your monitor

## 2022-02-05 NOTE — Telephone Encounter (Signed)
Dr Ali Lowe would like patient to have sleep study.  I placed call to patient to make her aware.  Left message to call office

## 2022-02-05 NOTE — Addendum Note (Signed)
Addended by: Thompson Grayer on: 02/05/2022 11:17 AM   Modules accepted: Orders

## 2022-02-05 NOTE — Progress Notes (Unsigned)
Enrolled for [employer] to mail a ZIO XT long term holter monitor to the patients address on file.  

## 2022-02-08 DIAGNOSIS — R002 Palpitations: Secondary | ICD-10-CM

## 2022-02-08 DIAGNOSIS — I700 Atherosclerosis of aorta: Secondary | ICD-10-CM | POA: Diagnosis not present

## 2022-02-09 ENCOUNTER — Other Ambulatory Visit: Payer: 59

## 2022-02-12 ENCOUNTER — Ambulatory Visit
Admission: RE | Admit: 2022-02-12 | Discharge: 2022-02-12 | Disposition: A | Discharge status: Home / Self Care | Payer: 59 | Source: Ambulatory Visit | Attending: Internal Medicine | Admitting: Internal Medicine

## 2022-02-12 DIAGNOSIS — N1831 Chronic kidney disease, stage 3a: Secondary | ICD-10-CM

## 2022-02-17 ENCOUNTER — Telehealth: Payer: Self-pay | Admitting: Genetic Counselor

## 2022-02-17 NOTE — Telephone Encounter (Signed)
Scheduled appt per 2/7 referral. Pt is aware of appt date and time. Pt is aware to arrive 15 mins prior to appt time and to bring and updated insurance card. Pt is aware of appt location.  Pt has requested to push appt out until April.

## 2022-03-01 ENCOUNTER — Other Ambulatory Visit: Payer: Self-pay

## 2022-03-01 ENCOUNTER — Ambulatory Visit (HOSPITAL_COMMUNITY): Payer: 59 | Attending: Internal Medicine

## 2022-03-01 DIAGNOSIS — I700 Atherosclerosis of aorta: Secondary | ICD-10-CM | POA: Insufficient documentation

## 2022-03-01 DIAGNOSIS — R002 Palpitations: Secondary | ICD-10-CM | POA: Insufficient documentation

## 2022-03-01 DIAGNOSIS — I340 Nonrheumatic mitral (valve) insufficiency: Secondary | ICD-10-CM

## 2022-03-01 LAB — ECHOCARDIOGRAM COMPLETE
Area-P 1/2: 4.6 cm2
S' Lateral: 3.3 cm

## 2022-03-02 NOTE — Telephone Encounter (Signed)
Called and left detailed message (DPR) of her echo results, and that after she left the ov Dr. Ali Lowe asked [place] to set up a sleep study for her.  Adv if she has questions about this or if it is not something she remembers discussing to call us back.  Adv that we would ask sleep dept to follow up with her.  ?

## 2022-03-09 NOTE — Progress Notes (Signed)
?Charlann Boxer D.O. ?[facility] ?[place] ?Phone: [phone] ?Subjective:   ?I, Jacqualin Combes, am serving as a scribe for Dr. Hulan Saas. ? ?This visit occurred during the SARS-CoV-2 public health emergency.  Safety protocols were in place, including screening questions prior to the visit, additional usage of staff PPE, and extensive cleaning of exam room while observing appropriate contact time as indicated for disinfecting solutions.  ? ?I'm seeing this patient by the request  of:  Biagio Borg, MD ? ?CC: Bilateral knee pain ? ?[id number]  ?Annette [name] is a 66 y.o. female coming in with complaint of B knee pain, L>R. Lasts seen in 2020 for OMT. Patient states that pain is in posterior aspect. Patient notes using standing desk at times but sits alot at work. Pain increases in this position with pressure on hamstring. Pain is achy and she does have pain that can radiate into front of knee. Patient does still run a little bit.  ? ? ?  ? ?Past Medical History:  ?Diagnosis Date  ? ALLERGIC RHINITIS 10/01/2007  ? ANXIETY 11/10/2010  ? ATTENTION DEFICIT DISORDER 11/10/2010  ? BARTHOLIN'S CYST 08/20/2007  ? Carpal tunnel syndrome 08/20/2007  ? COLONIC POLYPS, HX OF 08/20/2007  ? DEPRESSION 08/20/2007  ? EMPHYSEMA 06/16/2010  ? HOT FLASHES 09/26/2009  ? HYPERLIPIDEMIA 08/20/2007  ? HYPERTENSION 08/20/2007  ? Insomnia, unspecified 10/01/2007  ? ISCHEMIC COLITIS, HX OF 10/01/2007  ? MENOPAUSAL DISORDER 11/07/2009  ? OBESITY, MILD 08/20/2007  ? OSTEOPENIA 06/16/2010  ? POLYARTHRALGIA 11/06/2008  ? Raynaud's syndrome 08/20/2007  ? ?Past Surgical History:  ?Procedure Laterality Date  ? TONSILLECTOMY    ? ?Social History  ? ?Socioeconomic History  ? Marital status: Married  ?  Spouse name: Not on file  ? Number of children: 1  ? Years of education: Not on file  ? Highest education level: Not on file  ?Occupational History  ?  Employer: [employer]  ?Tobacco Use  ? Smoking  status: Former  ?  Types: Cigarettes  ?  Quit date: 12/27/1990  ?  Years since quitting: 31.2  ? Smokeless tobacco: Never  ?Substance and Sexual Activity  ? Alcohol use: Yes  ?  Comment: one drink per day  ? Drug use: No  ? Sexual activity: Not on file  ?Other Topics Concern  ? Not on file  ?Social History Narrative  ? Not on file  ? ?Social Determinants of Health  ? ?Financial Resource Strain: Not on file  ?Food Insecurity: Not on file  ?Transportation Needs: Not on file  ?Physical Activity: Not on file  ?Stress: Not on file  ?Social Connections: Not on file  ? ?No Known Allergies ?Family History  ?Problem Relation Age of Onset  ? Osteoporosis Mother   ? ADD / ADHD Daughter   ? Osteoporosis Maternal Grandmother   ? Breast cancer Maternal Grandmother   ? Coronary artery disease Other   ? Hypertension Other   ? Prostate cancer Other   ? Colon cancer Neg Hx   ? ? ? ?Current Outpatient Medications (Cardiovascular):  ?  rosuvastatin (CRESTOR) 10 MG tablet, 1 tab by mouth every other day ? ?Current Outpatient Medications (Respiratory):  ?  albuterol (VENTOLIN HFA) 108 (90 Base) MCG/ACT inhaler, Inhale 2 puffs into the lungs every 6 (six) hours as needed for wheezing or shortness of breath. ?  fluticasone (FLONASE) 50 MCG/ACT nasal spray, Place 2 sprays into both nostrils daily. ? ?Current  Outpatient Medications (Analgesics):  ?  aspirin EC 81 MG tablet, Take 1 tablet (81 mg total) by mouth daily. Swallow whole. ?  ibuprofen (ADVIL,MOTRIN) 200 MG tablet, Take 400 mg by mouth every 6 (six) hours as needed for moderate pain. ?  meloxicam (MOBIC) 15 MG tablet, Take 1 tablet (15 mg total) by mouth daily. ? ? ?Current Outpatient Medications (Other):  ?  Estradiol (VAGIFEM) 10 MCG TABS vaginal tablet, Place 1 tablet (10 mcg total) vaginally 2 (two) times a week. ?  gabapentin (NEURONTIN) 100 MG capsule, Take 2 capsules (200 mg total) by mouth at bedtime. ?  zolpidem (AMBIEN) 10 MG tablet, Take 1 tablet (10 mg total) by mouth at  bedtime as needed. for sleep ? ? ?Reviewed prior external information including notes and imaging from  ?primary care provider ?As well as notes that were available from care everywhere and other healthcare systems. ? ?Past medical history, social, surgical and family history all reviewed in electronic medical record.  No pertanent information unless stated regarding to the chief complaint.  ? ?Review of Systems: ? No headache, visual changes, nausea, vomiting, diarrhea, constipation, dizziness, abdominal pain, skin rash, fevers, chills, night sweats, weight loss, swollen lymph nodes, body aches, joint swelling, chest pain, shortness of breath, mood changes. POSITIVE muscle aches ? ?Objective  ?Blood pressure 112/80, pulse 70, height '5\' 3"'$  (1.6 m), weight 169 lb (76.7 kg), SpO2 98 %. ?  ?General: No apparent distress alert and oriented x3 mood and affect normal, dressed appropriately.  ?HEENT: Pupils equal, extraocular movements intact  ?Respiratory: Patient's speak in full sentences and does not appear short of breath  ?Cardiovascular: No lower extremity edema, non tender, no erythema  ?Gait normal with good balance and coordination.  ?MSK: Bilateral knee exam show the patient has some mild tightness with straight leg test but nothing significant at the moment.  Patient does not have any significant weakness noted. ?No instability of the knee noted. ? ?Limited muscular skeletal ultrasound was performed and interpreted by Hulan Saas, M  ?Muscle hypoechoic changes noted to the tendon of the hamstring bypasses the medial aspect of the knee.  No true tearing appreciated.  More consistent with a bursitis.  Patient does not have any significant swelling of the joint space.  Patient does have a calcific change noted of the patellofemoral joint or possibly calcific changes of the synovium of the left patellofemoral joint. ?Impression: Hamstring bursitis ? ?65993; 15 additional minutes spent for Therapeutic exercises as  stated in above notes.  This included exercises focusing on stretching, strengthening, with significant focus on eccentric aspects.   Long term goals include an improvement in range of motion, strength, endurance as well as avoiding reinjury. Patient's frequency would include in 1-2 times a day, 3-5 times a week for a duration of 6-12 weeks. Isometric contractions of thigh - 10 x 10 secs ?Straight leg raises - build to 3 sets of 30 and then add weights beginning with 2 lbs and increasing weight by 2 lbs when 3 x 30 reached and is easy to accomplish. Goal is 10 lbs. ?Drop squats - limit to 45 deg ?Modified lunge- running position ?Seated quad extensions ?Step downs ?Standing cone touches. ? Proper technique shown and discussed handout in great detail with ATC.  All questions were discussed and answered.  ? ?Impression and Recommendations:  ?  ? ?The above documentation has been reviewed and is accurate and complete Lyndal Pulley, DO ? ? ? ?

## 2022-03-10 ENCOUNTER — Other Ambulatory Visit: Payer: Self-pay

## 2022-03-10 ENCOUNTER — Ambulatory Visit: Payer: 59 | Admitting: Family Medicine

## 2022-03-10 ENCOUNTER — Ambulatory Visit (INDEPENDENT_AMBULATORY_CARE_PROVIDER_SITE_OTHER): Payer: 59

## 2022-03-10 ENCOUNTER — Ambulatory Visit: Payer: Self-pay

## 2022-03-10 VITALS — BP 112/80 | HR 70 | Ht 63.0 in | Wt 169.0 lb

## 2022-03-10 DIAGNOSIS — M25562 Pain in left knee: Secondary | ICD-10-CM

## 2022-03-10 DIAGNOSIS — M25561 Pain in right knee: Secondary | ICD-10-CM | POA: Diagnosis not present

## 2022-03-10 DIAGNOSIS — M7642 Tibial collateral bursitis [Pellegrini-Stieda], left leg: Secondary | ICD-10-CM | POA: Diagnosis not present

## 2022-03-10 NOTE — Patient Instructions (Signed)
Hamstring tendonitis ?Heel lifts ?Thigh compression sleeve: BodyHelix ?See me in 5-6 weeks ?

## 2022-03-10 NOTE — Assessment & Plan Note (Signed)
I believe the patient has more of a bursitis noted of the hamstring coming across the medial aspect of the leg.  Discussed with patient about the possibility of injection but would like to try more of a thigh compression, heel lift, home exercises.  Follow-up again in 6 to 8 weeks. ?

## 2022-03-22 ENCOUNTER — Other Ambulatory Visit: Payer: 59 | Admitting: *Deleted

## 2022-03-22 ENCOUNTER — Other Ambulatory Visit: Payer: Self-pay

## 2022-03-22 DIAGNOSIS — I700 Atherosclerosis of aorta: Secondary | ICD-10-CM

## 2022-03-22 LAB — LIPID PANEL
Chol/HDL Ratio: 2.9 ratio (ref 0.0–4.4)
Cholesterol, Total: 161 mg/dL (ref 100–199)
HDL: 55 mg/dL (ref >39)
LDL Chol Calc (NIH): 86 mg/dL (ref 0–99)
Triglycerides: 111 mg/dL (ref 0–149)
VLDL Cholesterol Cal: 20 mg/dL (ref 5–40)

## 2022-03-22 LAB — HEPATIC FUNCTION PANEL
ALT: 25 IU/L (ref 0–32)
AST: 28 IU/L (ref 0–40)
Albumin: 4.4 g/dL (ref 3.8–4.8)
Alkaline Phosphatase: 74 IU/L (ref 44–121)
Bilirubin Total: 0.4 mg/dL (ref 0.0–1.2)
Bilirubin, Direct: 0.11 mg/dL (ref 0.00–0.40)
Total Protein: 6.8 g/dL (ref 6.0–8.5)

## 2022-03-23 ENCOUNTER — Ambulatory Visit: Payer: 59

## 2022-03-23 ENCOUNTER — Telehealth: Payer: Self-pay

## 2022-03-23 MED ORDER — ROSUVASTATIN CALCIUM 20 MG PO TABS: 40.0000 mg | ORAL_TABLET | Freq: Every day | ORAL | 3 refills | Status: DC

## 2022-03-23 NOTE — Telephone Encounter (Addendum)
Patient is aware of lab results. Patient is worried about taking a higher dose of Crestor, due to side effects. Patient has an appointment with [facility] in April. Patient would like the higher dose of Crestor sent in to her pharmacy and she will try to slowly work her way up to the higher dose. Informed patient that at the lipid Clinic they will adjust medication according to her tolerance and recheck her lipid panel as needed. ?

## 2022-03-23 NOTE — Telephone Encounter (Signed)
-----   Message from Early Osmond, MD sent at 03/23/2022  9:54 AM EDT ----- ?LDL above 70.  Please have her increase crestor to '40mg'$ .  If she is having problems with Crestor, have her see pharmacy for lipid management. ?

## 2022-03-26 ENCOUNTER — Telehealth: Payer: Self-pay | Admitting: *Deleted

## 2022-03-26 NOTE — Telephone Encounter (Signed)
3/31 OK TO SCHEDULE  PER GRAYSON B 03/26/22 .NO PA REQUIRED ?

## 2022-04-01 ENCOUNTER — Other Ambulatory Visit: Payer: Self-pay

## 2022-04-01 ENCOUNTER — Inpatient Hospital Stay: Payer: 59 | Attending: Genetic Counselor | Admitting: Genetic Counselor

## 2022-04-01 ENCOUNTER — Inpatient Hospital Stay: Payer: 59

## 2022-04-01 DIAGNOSIS — Z800 Family history of malignant neoplasm of digestive organs: Secondary | ICD-10-CM | POA: Diagnosis not present

## 2022-04-01 DIAGNOSIS — Z803 Family history of malignant neoplasm of breast: Secondary | ICD-10-CM | POA: Diagnosis not present

## 2022-04-01 LAB — GENETIC SCREENING ORDER

## 2022-04-02 ENCOUNTER — Encounter: Payer: Self-pay | Admitting: Genetic Counselor

## 2022-04-02 DIAGNOSIS — Z800 Family history of malignant neoplasm of digestive organs: Secondary | ICD-10-CM | POA: Insufficient documentation

## 2022-04-02 NOTE — Progress Notes (Signed)
REFERRING PROVIDER: ?Biagio Borg, MD ?[place],  [place] [address] ? ?PRIMARY PROVIDER:  ?Biagio Borg, MD ? ?PRIMARY REASON FOR VISIT:  ?1. Family history of breast cancer   ?2. Family history of pancreatic cancer   ? ?HISTORY OF PRESENT ILLNESS:   ?Annette [name], a 66 y.o. female, was seen for a [facility] cancer genetics consultation at the request of Dr. Jenny Reichmann due to a family history of cancer.  Annette [name] presents to clinic today to discuss the possibility of a hereditary predisposition to cancer, to discuss genetic testing, and to further clarify her future cancer risks, as well as potential cancer risks for family members.  ? ?Annette [name] is a 66 y.o. female with no personal history of cancer.   ? ?CANCER HISTORY:  ?Oncology History  ? No history exists.  ? ? ? ?RISK FACTORS:  ?Menarche was at age 76.  ?First live birth at age 36.  ?OCP use for approximately 10+ years.  ?Ovaries intact: yes.  ?Uterus intact: yes.  ?Menopausal status: postmenopausal, menopause at age 25  ?HRT use: 0 years. ?Colonoscopy: yes;  she reports a history of polyps . ?Mammogram within the last year: yes. ?Any excessive radiation exposure in the past: no ? ?Past Medical History:  ?Diagnosis Date  ? ALLERGIC RHINITIS 10/01/2007  ? ANXIETY 11/10/2010  ? ATTENTION DEFICIT DISORDER 11/10/2010  ? BARTHOLIN'S CYST 08/20/2007  ? Carpal tunnel syndrome 08/20/2007  ? COLONIC POLYPS, HX OF 08/20/2007  ? DEPRESSION 08/20/2007  ? EMPHYSEMA 06/16/2010  ? HOT FLASHES 09/26/2009  ? HYPERLIPIDEMIA 08/20/2007  ? HYPERTENSION 08/20/2007  ? Insomnia, unspecified 10/01/2007  ? ISCHEMIC COLITIS, HX OF 10/01/2007  ? MENOPAUSAL DISORDER 11/07/2009  ? OBESITY, MILD 08/20/2007  ? OSTEOPENIA 06/16/2010  ? POLYARTHRALGIA 11/06/2008  ? Raynaud's syndrome 08/20/2007  ? ? ?Past Surgical History:  ?Procedure Laterality Date  ? TONSILLECTOMY    ? ? ?Social History  ? ?Socioeconomic History  ? Marital status: Married  ?  Spouse name: Not on file  ? Number of children:  1  ? Years of education: Not on file  ? Highest education level: Not on file  ?Occupational History  ?  Employer: [employer]  ?Tobacco Use  ? Smoking status: Former  ?  Types: Cigarettes  ?  Quit date: 12/27/1990  ?  Years since quitting: 31.2  ? Smokeless tobacco: Never  ?Substance and Sexual Activity  ? Alcohol use: Yes  ?  Comment: one drink per day  ? Drug use: No  ? Sexual activity: Not on file  ?Other Topics Concern  ? Not on file  ?Social History Narrative  ? Not on file  ? ?Social Determinants of Health  ? ?Financial Resource Strain: Not on file  ?Food Insecurity: Not on file  ?Transportation Needs: Not on file  ?Physical Activity: Not on file  ?Stress: Not on file  ?Social Connections: Not on file  ?  ? ?FAMILY HISTORY:  ?We obtained a detailed, 4-generation family history.  Significant diagnoses are listed below: ?Family History  ?Problem Relation Age of Onset  ? Osteoporosis Mother   ? Pancreatic cancer Father   ?     dx. 62s  ? Osteoporosis Maternal Grandmother   ? Breast cancer Maternal Grandmother   ?     dx. 50s  ? ADD / ADHD Daughter   ? Coronary artery disease Other   ? Hypertension Other   ? Prostate cancer Other   ? Breast  cancer Other   ?     dx. 23s  ? Breast cancer Niece 38  ? Colon cancer Neg Hx   ? ? ? ?Annette [name]'s niece was diagnosed with breast cancer at age 72 and recently died due to metastasis at age 51. Her maternal grandmother was diagnosed with breast cancer in her late 8s, she had a double mastectomy but the breast cancer later recurred and metastasized, she died in her early 25s. Her maternal great aunt (grandmother's sister) was diagnosed with breast cancer in her late 30s, she died due to breast cancer metastasis. Annette [name] father was diagnosed with pancreatic cancer in his 60s and died due to metastasis in his 48s.  ? ?Annette [name] is unaware of previous family history of genetic testing for hereditary cancer risks. There is no reported Ashkenazi Jewish ancestry.   ? ?GENETIC COUNSELING ASSESSMENT: Annette [name] is a 66 y.o. female with a family history of cancer which is somewhat suggestive of a hereditary predisposition to cancer. We, therefore, discussed and recommended the following at today's visit.  ? ?DISCUSSION: We discussed that 5 - 10% of cancer is hereditary, with most cases of hereditary breast cancer associated with BRCA1/2.  There are other genes that can be associated with hereditary breast and pancreatic cancer syndromes.  We discussed that testing is beneficial for several reasons, including knowing about other cancer risks, identifying potential screening and risk-reduction options that may be appropriate, and to understanding if other family members could be at risk for cancer and allowing them to undergo genetic testing. ? ?We reviewed the characteristics, features and inheritance patterns of hereditary cancer syndromes. We also discussed genetic testing, including the appropriate family members to test, the process of testing, insurance coverage and turn-around-time for results. We discussed the implications of a negative, positive, carrier and/or variant of uncertain significant result. We recommended Annette [name] pursue genetic testing for a panel that contains genes associated with breast and pancreatic cancer. ? ?Annette [name] was offered a common hereditary cancer panel (47 genes) and an expanded pan-cancer panel (77 genes). Annette [name] was informed of the benefits and limitations of each panel, including that expanded pan-cancer panels contain several genes that do not have clear management guidelines at this point in time.  We also discussed that as the number of genes included on a panel increases, the chances of variants of uncertain significance increases.  After considering the benefits and limitations of each gene panel, Annette [name] elected to have [employer]. ? ?The CustomNext-Cancer+RNAinsight panel offered by [facility] includes  sequencing and rearrangement analysis for the following 47 genes:  APC, ATM, AXIN2, BARD1, BMPR1A, BRCA1, BRCA2, BRIP1, CDH1, CDK4, CDKN2A, CHEK2, CTNNA1, DICER1, EPCAM, GREM1, HOXB13, KIT, MEN1, MLH1, MSH2, MSH3, MSH6, MUTYH, NBN, NF1, NTHL1, PALB2, PDGFRA, PMS2, POLD1, POLE, PTEN, RAD50, RAD51C, RAD51D, SDHA, SDHB, SDHC, SDHD, SMAD4, SMARCA4, STK11, TP53, TSC1, TSC2, and VHL.  RNA data is routinely analyzed for use in variant interpretation for all genes. ? ?Based on Annette [name]'s family history of cancer, she meets medical criteria for genetic testing. Despite that she meets criteria, she may still have an out of pocket cost. We discussed that if her out of pocket cost for testing is over $100, the laboratory will call and confirm whether she wants to proceed with testing.  If the out of pocket cost of testing is less than $100 she will be billed by the genetic testing laboratory.  ? ?We discussed that some people do not  want to undergo genetic testing due to fear of genetic discrimination.  A federal law called the Genetic Information Non-Discrimination Act (GINA) of 2008 helps protect individuals against genetic discrimination based on their genetic test results.  It impacts both health insurance and employment.  With health insurance, it protects against increased premiums, being kicked off insurance or being forced to take a test in order to be insured.  For employment it protects against hiring, firing and promoting decisions based on genetic test results.  GINA does not apply to those in the [employer], those who work for companies with less than 15 employees, and new life insurance or long-term disability insurance policies.  Health status due to a cancer diagnosis is not protected under GINA. ? ?PLAN: After considering the risks, benefits, and limitations, Annette [name] provided informed consent to pursue genetic testing and the blood sample was sent to [facility] for analysis of the CustomNext Panel.  Results should be available within approximately 2-3 weeks' time, at which point they will be disclosed by telephone to Annette [name], as will any additional recommendations warranted by these results. Ms. Theodoro Doing

## 2022-04-07 NOTE — Progress Notes (Signed)
?Charlann Boxer D.O. ?[facility] ?[place] ?Phone: [phone] ?Subjective:   ?I, Jacqualin Combes, am serving as a scribe for Dr. Hulan Saas. ? ?This visit occurred during the SARS-CoV-2 public health emergency.  Safety protocols were in place, including screening questions prior to the visit, additional usage of staff PPE, and extensive cleaning of exam room while observing appropriate contact time as indicated for disinfecting solutions.  ? ? ?I'm seeing this patient by the request  of:  Biagio Borg, MD ? ?CC: Left shoulder pain ? ?[id number]  ?03/10/2022 ?I believe the patient has more of a bursitis noted of the hamstring coming across the medial aspect of the leg.  Discussed with patient about the possibility of injection but would like to try more of a thigh compression, heel lift, home exercises.  Follow-up again in 6 to 8 weeks. ? ?Update 04/12/2022 ?NALINA YEATMAN is a 66 y.o. female coming in with complaint of L knee pain. Patient states that her hamstring has improved but is now having L shoulder pain. 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 Taking IBU prn. Painful with flexion and reaching out from her body. No previous injuries to this area.  ? ? ? ?  ? ?Past Medical History:  ?Diagnosis Date  ? ALLERGIC RHINITIS 10/01/2007  ? ANXIETY 11/10/2010  ? ATTENTION DEFICIT DISORDER 11/10/2010  ? BARTHOLIN'S CYST 08/20/2007  ? Carpal tunnel syndrome 08/20/2007  ? COLONIC POLYPS, HX OF 08/20/2007  ? DEPRESSION 08/20/2007  ? EMPHYSEMA 06/16/2010  ? HOT FLASHES 09/26/2009  ? HYPERLIPIDEMIA 08/20/2007  ? HYPERTENSION 08/20/2007  ? Insomnia, unspecified 10/01/2007  ? ISCHEMIC COLITIS, HX OF 10/01/2007  ? MENOPAUSAL DISORDER 11/07/2009  ? OBESITY, MILD 08/20/2007  ? OSTEOPENIA 06/16/2010  ? POLYARTHRALGIA 11/06/2008  ? Raynaud's syndrome 08/20/2007  ? ?Past Surgical History:  ?Procedure Laterality Date  ? TONSILLECTOMY    ? ?Social History   ? ?Socioeconomic History  ? Marital status: Married  ?  Spouse name: Not on file  ? Number of children: 1  ? Years of education: Not on file  ? Highest education level: Not on file  ?Occupational History  ?  Employer: [employer]  ?Tobacco Use  ? Smoking status: Former  ?  Types: Cigarettes  ?  Quit date: 12/27/1990  ?  Years since quitting: 31.3  ? Smokeless tobacco: Never  ?Substance and Sexual Activity  ? Alcohol use: Yes  ?  Comment: one drink per day  ? Drug use: No  ? Sexual activity: Not on file  ?Other Topics Concern  ? Not on file  ?Social History Narrative  ? Not on file  ? ?Social Determinants of Health  ? ?Financial Resource Strain: Not on file  ?Food Insecurity: Not on file  ?Transportation Needs: Not on file  ?Physical Activity: Not on file  ?Stress: Not on file  ?Social Connections: Not on file  ? ?No Known Allergies ?Family History  ?Problem Relation Age of Onset  ? Osteoporosis Mother   ? Pancreatic cancer Father   ?     dx. 67s  ? Osteoporosis Maternal Grandmother   ? Breast cancer Maternal Grandmother   ?     dx. 100s  ? ADD / ADHD Daughter   ? Coronary artery disease Other   ? Hypertension Other   ? Prostate cancer Other   ? Breast cancer Other   ?  dx. 76s  ? Breast cancer Niece 78  ? Colon cancer Neg Hx   ? ? ? ?Current Outpatient Medications (Cardiovascular):  ?  rosuvastatin (CRESTOR) 20 MG tablet, Take 2 tablets (40 mg total) by mouth daily. ? ?Current Outpatient Medications (Respiratory):  ?  albuterol (VENTOLIN HFA) 108 (90 Base) MCG/ACT inhaler, Inhale 2 puffs into the lungs every 6 (six) hours as needed for wheezing or shortness of breath. ?  fluticasone (FLONASE) 50 MCG/ACT nasal spray, Place 2 sprays into both nostrils daily. ? ?Current Outpatient Medications (Analgesics):  ?  aspirin EC 81 MG tablet, Take 1 tablet (81 mg total) by mouth daily. Swallow whole. ?  ibuprofen (ADVIL,MOTRIN) 200 MG tablet, Take 400 mg by mouth every 6 (six) hours as needed for moderate pain. ?   meloxicam (MOBIC) 15 MG tablet, Take 1 tablet (15 mg total) by mouth daily. ? ? ?Current Outpatient Medications (Other):  ?  Estradiol (VAGIFEM) 10 MCG TABS vaginal tablet, Place 1 tablet (10 mcg total) vaginally 2 (two) times a week. ?  gabapentin (NEURONTIN) 100 MG capsule, Take 2 capsules (200 mg total) by mouth at bedtime. ?  zolpidem (AMBIEN) 10 MG tablet, Take 1 tablet (10 mg total) by mouth at bedtime as needed. for sleep ? ? ?Reviewed prior external information including notes and imaging from  ?primary care provider ?As well as notes that were available from care everywhere and other healthcare systems. ? ?Past medical history, social, surgical and family history all reviewed in electronic medical record.  No pertanent information unless stated regarding to the chief complaint.  ? ?Review of Systems: ? No headache, visual changes, nausea, vomiting, diarrhea, constipation, dizziness, abdominal pain, skin rash, fevers, chills, night sweats, weight loss, swollen lymph nodes, body aches, joint swelling, chest pain, shortness of breath, mood changes. POSITIVE muscle aches ? ?Objective  ?Blood pressure 110/76, pulse 62, height '5\' 3"'$  (1.6 m), weight 167 lb (75.8 kg), SpO2 99 %. ?  ?General: No apparent distress alert and oriented x3 mood and affect normal, dressed appropriately.  ?HEENT: Pupils equal, extraocular movements intact  ?Respiratory: Patient's speak in full sentences and does not appear short of breath  ?Cardiovascular: No lower extremity edema, non tender, no erythema  ?Gait antalgic gait  ?MSK: Left shoulder exam does show positive impingement noted.  Rotator cuff strength appears to be intact.  5 out of 5 strength with empty can.  Positive Neer and Hawkins, some limited range of motion in internal and external range of motion compared to the contralateral shoulder ? ?Shoulder: left ?Inspection reveals no abnormalities, atrophy or asymmetry. ?Palpation is normal with no tenderness over AC joint or  bicipital groove. ?ROM is full in all planes passively. ?Rotator cuff strength normal throughout. ?signs of impingement with positive Neer and Hawkin's tests, but negative empty can sign. ?Speeds and Yergason's tests normal. ?No labral pathology noted with negative Obrien's, negative clunk and good stability. ?Normal scapular function observed. ?No painful arc and no drop arm sign. ?No apprehension sign ? ?MSK US performed of: left ?This study was ordered, performed, and interpreted by Charlann Boxer D.O. ? ?Shoulder:   ?Supraspinatus:  Appears normal on long and transverse views, Bursal bulge seen with shoulder abduction on impingement view. ?Subscapularis:  Appears normal on long and transverse views. Positive bursa ?AC joint: Effusion noted of the [facility] joint moderate narrowing noted ?Glenohumeral Joint:  Appears normal without effusion. ?Biceps Tendon:  Appears normal on long and transverse views, no fraying of tendon, tendon located  in intertubercular groove, no subluxation with shoulder internal or external rotation. ? ?Impression: Subacromial bursitis, thickening of the anterior capsule, AC effusion ? ?Procedure: Real-time Ultrasound Guided Injection of left glenohumeral joint ?Device: GE Logiq E  ?Ultrasound guided injection is preferred based studies that show increased duration, increased effect, greater accuracy, decreased procedural pain, increased response rate with ultrasound guided versus blind injection.  ?Verbal informed consent obtained.  ?Time-out conducted.  ?Noted no overlying erythema, induration, or other signs of local infection.  ?Skin prepped in a sterile fashion.  ?Local anesthesia: Topical Ethyl chloride.  ?With sterile technique and under real time ultrasound guidance:  Joint visualized.  23g 1 ? inch needle inserted posterior approach. Pictures taken for needle placement. Patient did have injection of 2 cc of 1% lidocaine, 2 cc of 0.5% Marcaine, and 1.0 cc of Kenalog 40 mg/dL. ?Completed  without difficulty  ?Pain immediately resolved suggesting accurate placement of the medication.  ?Advised to call if fevers/chills, erythema, induration, drainage, or persistent bleeding.  ?Images permanent

## 2022-04-12 ENCOUNTER — Ambulatory Visit (INDEPENDENT_AMBULATORY_CARE_PROVIDER_SITE_OTHER): Payer: 59 | Admitting: Family Medicine

## 2022-04-12 ENCOUNTER — Ambulatory Visit (INDEPENDENT_AMBULATORY_CARE_PROVIDER_SITE_OTHER): Payer: 59

## 2022-04-12 ENCOUNTER — Ambulatory Visit: Payer: Self-pay

## 2022-04-12 VITALS — BP 110/76 | HR 62 | Ht 63.0 in | Wt 167.0 lb

## 2022-04-12 DIAGNOSIS — M25512 Pain in left shoulder: Secondary | ICD-10-CM | POA: Diagnosis not present

## 2022-04-12 DIAGNOSIS — M19012 Primary osteoarthritis, left shoulder: Secondary | ICD-10-CM | POA: Diagnosis not present

## 2022-04-12 DIAGNOSIS — M7552 Bursitis of left shoulder: Secondary | ICD-10-CM

## 2022-04-12 NOTE — Patient Instructions (Signed)
See me in 6-8 weeks ?Keep hands in peripheral vision ?Injected shoulder today ?

## 2022-04-12 NOTE — Assessment & Plan Note (Signed)
Patient given injection and tolerated the procedure well, discussed icing regimen and home exercises, which activities to do which ones to avoid, increase activity slowly.  Follow-up again in 6 to 8 weeks. ?

## 2022-04-12 NOTE — Assessment & Plan Note (Signed)
Injection given today and tolerated the procedure well, discussed icing regimen and home exercise, which activities to do which ones to avoid, increase activity slowly.  Patient also had acromioclavicular arthritis with swelling and we will see how patient responds to the injection.  Work with the flutter trainer.  Follow-up with me again in 6 to 8 weeks patient did have some signs and symptoms concerning for possible early frozen shoulder so hopefully the injections will help this as well. ?

## 2022-04-12 NOTE — Progress Notes (Signed)
Patient ID: Annette [name]                 DOB: 1956-09-22                    MRN: [id number] ? ? ? ?HPI: ?Annette [name] is a 67 y.o. female patient referred to lipid clinic by Dr. Ali Lowe. PMH is significant for HTN and aortic atherosclerosis on CT scan. Pt saw Dr.Thukkani for evaluation of palpitations on 02/05/22 and he noted her LDL from 01/29/22 labs was elevated to 85. He increased her rosuvastatin from 10 mg every other day to 10 mg daily and scheduled for repeat lipid panel in 6 weeks. Repeat labs on 03/22/22 showed LDL still high at 86. Pt informed to increase rosuvastatin to 40 mg daily and stated she was worried about the higher dose d/t side effects. Said she would try to slowly work her way up to the higher dose. Pt was informed PharmD at lipid clinic will adjust the medication according to her tolerance and recheck lipid panels as needed. ? ?Pt presents today for lipid clinic appointment. Pt has increased rosuvastatin from 10 mg daily to 20 mg daily 2 weeks ago. She has been more consistent than before with taking her med and has only missed 3-4 doses. She has not noticed any difference since increasing dose. Does report having body aches, but they are not significant and she is trying to get back into exercise which could be contributing. Did say she tried atorvastatin in the past and could not tolerate it. Is hesitant to increase to 40 mg daily because she does not want to have muscle aches and wants to recheck her lipids first after increasing her dose before going up or adding medications. ? ?Current Medications: rosuvastatin 20 mg daily ?Previously tried: rosuvastatin 10 mg every other day, rosuvastatin 10 mg daily ?Intolerances: atorvastatin 10 mg - muscle aches ?Risk Factors: ASCVD, HTN ? ?LDL goal: <70 mg/dL ? ?Labs: ?03/22/22: TC 161, TG 111, HDL 55, LDL 86 (rosuvastatin 10 mg daily) ?01/29/22: TC 157, TG 101, HDL 51, LDL 85 (rosuvastatin 10 mg every other day) ? ?Diet: breakfast - oatmeal with  walnuts/dates; lunch - chicken or bean soup, avocado; dinner - grilled chicken, spaghetti; trying to cut back on sweets ? ?Exercise: 2-3x per week doing spin classes for ~40 mins ? ?Family History: CAD and HTN in unknown relative ? ?Social History: Used to smoke, quit 31 years ago. Denies illicit drug use. Does report drinking one alcoholic drink per day ? ?Past Medical History:  ?Diagnosis Date  ? ALLERGIC RHINITIS 10/01/2007  ? ANXIETY 11/10/2010  ? ATTENTION DEFICIT DISORDER 11/10/2010  ? BARTHOLIN'S CYST 08/20/2007  ? Carpal tunnel syndrome 08/20/2007  ? COLONIC POLYPS, HX OF 08/20/2007  ? DEPRESSION 08/20/2007  ? EMPHYSEMA 06/16/2010  ? HOT FLASHES 09/26/2009  ? HYPERLIPIDEMIA 08/20/2007  ? HYPERTENSION 08/20/2007  ? Insomnia, unspecified 10/01/2007  ? ISCHEMIC COLITIS, HX OF 10/01/2007  ? MENOPAUSAL DISORDER 11/07/2009  ? OBESITY, MILD 08/20/2007  ? OSTEOPENIA 06/16/2010  ? POLYARTHRALGIA 11/06/2008  ? Raynaud's syndrome 08/20/2007  ? ? ?Current Outpatient Medications on File Prior to Visit  ?Medication Sig Dispense Refill  ? albuterol (VENTOLIN HFA) 108 (90 Base) MCG/ACT inhaler Inhale 2 puffs into the lungs every 6 (six) hours as needed for wheezing or shortness of breath. 3 each 1  ? aspirin EC 81 MG tablet Take 1 tablet (81 mg total) by mouth daily. Swallow whole. 90 tablet  3  ? Estradiol (VAGIFEM) 10 MCG TABS vaginal tablet Place 1 tablet (10 mcg total) vaginally 2 (two) times a week. 24 tablet 3  ? fluticasone (FLONASE) 50 MCG/ACT nasal spray Place 2 sprays into both nostrils daily. 16 g 6  ? gabapentin (NEURONTIN) 100 MG capsule Take 2 capsules (200 mg total) by mouth at bedtime. 180 capsule 3  ? ibuprofen (ADVIL,MOTRIN) 200 MG tablet Take 400 mg by mouth every 6 (six) hours as needed for moderate pain.    ? meloxicam (MOBIC) 15 MG tablet Take 1 tablet (15 mg total) by mouth daily. 90 tablet 3  ? rosuvastatin (CRESTOR) 20 MG tablet Take 2 tablets (40 mg total) by mouth daily. 180 tablet 3  ? zolpidem (AMBIEN) 10 MG  tablet Take 1 tablet (10 mg total) by mouth at bedtime as needed. for sleep 90 tablet 1  ? ?No current facility-administered medications on file prior to visit.  ? ? ?No Known Allergies ? ?Assessment/Plan: ? ?1. Hyperlipidemia - LDL 86 above goal < 70. Pt was able to increase rosuvastatin from 10 mg daily to 20 mg daily 2 weeks ago. Continue rosuvastatin 20 mg daily. Discussed the additional 6% LDL lowering she gets with every dose increase. Will recheck lipid panel on 05/10/22 after being on a high-intensity dose for 6 weeks. If LDL is still above goal, will call pt to discuss further options such as increasing rosuvastatin to 40 mg daily or adding ezetimibe.  ? ?Patient seen with Debria Garret, [place] pharmacy student ? ?Rebbeca Paul, PharmD ?PGY2 Resident ? ?Ramond Dial, Pharm.D, BCPS, CPP ?[id number] [place], [place] [address]  ?Phone: [phone]; Fax: [phone]  ? ? ?

## 2022-04-13 ENCOUNTER — Ambulatory Visit (INDEPENDENT_AMBULATORY_CARE_PROVIDER_SITE_OTHER): Payer: 59 | Admitting: Student-PharmD

## 2022-04-13 DIAGNOSIS — E7800 Pure hypercholesterolemia, unspecified: Secondary | ICD-10-CM | POA: Diagnosis not present

## 2022-04-13 NOTE — Patient Instructions (Signed)
Your LDL goal is < 70 ? ?Continue rosuvastatin 20 mg daily (can continue taking two 10 mg tablets until you are out) ? ?Will recheck labs on 05/10/22 and can make any medication changes based on results ? ?

## 2022-04-14 ENCOUNTER — Ambulatory Visit: Payer: 59 | Admitting: Sports Medicine

## 2022-04-20 ENCOUNTER — Ambulatory Visit: Payer: 59 | Admitting: Sports Medicine

## 2022-04-23 ENCOUNTER — Telehealth: Payer: Self-pay | Admitting: Genetic Counselor

## 2022-04-23 ENCOUNTER — Encounter: Payer: Self-pay | Admitting: Genetic Counselor

## 2022-04-23 ENCOUNTER — Other Ambulatory Visit: Payer: Self-pay | Admitting: Internal Medicine

## 2022-04-23 DIAGNOSIS — Z1379 Encounter for other screening for genetic and chromosomal anomalies: Secondary | ICD-10-CM | POA: Insufficient documentation

## 2022-04-23 NOTE — Telephone Encounter (Signed)
Sorry - med stopped - no refill needed ?

## 2022-04-23 NOTE — Telephone Encounter (Signed)
Please refill as per office routine med refill policy (all routine meds to be refilled for 3 mo or monthly (per pt preference) up to one year from last visit, then month to month grace period for 3 mo, then further med refills will have to be denied) ? ?

## 2022-04-23 NOTE — Telephone Encounter (Signed)
I attempted to contact Ms. Godina to discuss her genetic testing results (47 genes). I left a voicemail requesting she call me back at [phone]. ? ?Lucille Passy, MS, LCGC ?Genetic Counselor ?[email or web address] ?(P) [phone] ? ?

## 2022-04-26 ENCOUNTER — Telehealth: Payer: Self-pay | Admitting: Genetic Counselor

## 2022-04-26 NOTE — Telephone Encounter (Signed)
I contacted Ms. Leiphart to discuss her genetic testing results. No pathogenic variants were identified in the 47 genes analyzed. 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 Detailed clinic note to follow. ? ?The test report has been scanned into EPIC and is located under the Molecular Pathology section of the Results Review tab.  A portion of the result report is included below for reference.  ? ?Lucille Passy, MS, [place] ?Genetic Counselor ?[email or web address] ?(P) [phone] ? ? ?

## 2022-04-30 ENCOUNTER — Ambulatory Visit: Payer: Self-pay | Admitting: Genetic Counselor

## 2022-04-30 DIAGNOSIS — Z1379 Encounter for other screening for genetic and chromosomal anomalies: Secondary | ICD-10-CM

## 2022-04-30 NOTE — Progress Notes (Addendum)
 HPI:   Annette [name] was previously seen in the [facility] due to a family history of cancer and concerns regarding a hereditary predisposition to cancer. Please refer to our prior cancer genetics clinic note for more information regarding our discussion, assessment and recommendations, at the time. Annette [name] recent genetic test results were disclosed to her, as were recommendations warranted by these results. These results and recommendations are discussed in more detail below.  CANCER HISTORY:  Oncology History   No history exists.    FAMILY HISTORY:  We obtained a detailed, 4-generation family history.  Significant diagnoses are listed below:      Family History  Problem Relation Age of Onset   Osteoporosis Mother     Pancreatic cancer Father          dx. 84s   Osteoporosis Maternal Grandmother     Breast cancer Maternal Grandmother          dx. 44s   ADD / ADHD Daughter     Coronary artery disease Other     Hypertension Other     Prostate cancer Other     Breast cancer Other          dx. 48s   Breast cancer Niece 69   Colon cancer Neg Hx         Annette [name]'s niece was diagnosed with breast cancer at age 62 and recently died due to metastasis at age 70. Her maternal grandmother was diagnosed with breast cancer in her late 49s, she had a double mastectomy but the breast cancer later recurred and metastasized, she died in her early 40s. Her maternal great aunt (grandmother's sister) was diagnosed with breast cancer in her late 67s, she died due to breast cancer metastasis. Annette [name] father was diagnosed with pancreatic cancer in his 47s and died due to metastasis in his 63s.    Annette [name] is unaware of previous family history of genetic testing for hereditary cancer risks. There is no reported Ashkenazi Jewish ancestry.   GENETIC TEST RESULTS:  The [facility] CustomNext Panel found no pathogenic mutations.   The CustomNext-Cancer+RNAinsight panel offered by  [facility] includes sequencing and rearrangement analysis for the following 47 genes:  APC, ATM, AXIN2, BARD1, BMPR1A, BRCA1, BRCA2, BRIP1, CDH1, CDK4, CDKN2A, CHEK2, CTNNA1, DICER1, EPCAM, GREM1, HOXB13, KIT, MEN1, MLH1, MSH2, MSH3, MSH6, MUTYH, NBN, NF1, NTHL1, PALB2, PDGFRA, PMS2, POLD1, POLE, PTEN, RAD50, RAD51C, RAD51D, SDHA, SDHB, SDHC, SDHD, SMAD4, SMARCA4, STK11, TP53, TSC1, TSC2, and VHL.  RNA data is routinely analyzed for use in variant interpretation for all genes.  The test report has been scanned into EPIC and is located under the Molecular Pathology section of the Results Review tab.  A portion of the [name] report is included below for reference. Genetic testing reported out on 04/21/2022.       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  At this time, it is unknown if this variant is associated with an increased risk for cancer or if it is benign, but most uncertain variants are reclassified to benign. It should not be used to make medical management decisions. With time, we suspect the laboratory will determine the significance of this variant, if any. If the laboratory reclassifies this variant, we will attempt to contact Annette [name] to discuss it further.   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 Report date is 02/28/2024.  Even  though a pathogenic variant was not identified, possible explanations for the cancer in the family may include: There may be no hereditary risk for cancer in the family. The cancers in her family may be due to other genetic or environmental factors. There may be a gene mutation in one of these genes that current testing methods cannot detect, but that chance is small. There could be another gene that has not yet been discovered, or that we have not yet tested, that is responsible for the cancer diagnoses in the family.  It is also possible there is a  hereditary cause for the cancer in the family that Annette [name] did not inherit. The variant of uncertain significance detected in the BARD1 gene may be reclassified as a pathogenic variant in the future. At this time, we do not know if this variant increases the risk for cancer.  Therefore, it is important to remain in touch with cancer genetics in the future so that we can continue to offer Annette [name] the most up to date genetic testing.   ADDITIONAL GENETIC TESTING:  We discussed with Annette [name] that her genetic testing was fairly extensive.  If there are genes identified to increase cancer risk that can be analyzed in the future, we would be happy to discuss and coordinate this testing at that time.    CANCER SCREENING RECOMMENDATIONS:  Annette [name] is considered negative (normal).  This means that we have not identified a hereditary cause for her family history of cancer at this time.   An individual's cancer risk and medical management are not determined by genetic test results alone. Overall cancer risk assessment incorporates additional factors, including personal medical history, family history, and any available genetic information that may [name] in a personalized plan for cancer prevention and surveillance. Therefore, it is recommended she continue to follow the cancer management and screening guidelines provided by her primary healthcare provider.  Based on the reported personal and family history, specific cancer screenings for Annette [name] and her family include:  Breast Cancer Screening: Multiple prediction models have been developed to assist with breast cancer risk prediction efforts for unaffected women without a known single gene risk factor identified in their family. The Tyrer-Cuzick model is one such risk assessment tool. This model was developed to include extensive family history information, endogenous estrogen exposure, and benign breast disease. The  calculation is highly-dependent on the accuracy of clinical data provided by the patient. Other factors not accounted for in the calculation may impact lifetime breast cancer risk including, but not limited to, germline mutations not analyzed by the ordered genetic test or clinical information not provided at the time of the testing. The risk number provided is patient-specific and cannot be used to infer risk to relatives.  Annette [name]'sTyrer-Cuzick risk score is 10.2%.  She is encouraged to contact us regarding any changes to her personal or family history, as her recommendations for screening would be altered significantly if her lifetime risk is determined to be greater than 20% based on updated information.  She is encouraged to diligently follow standard screening protocols including annual mammograms.   RECOMMENDATIONS FOR FAMILY MEMBERS:   Since she did not inherit a mutation in a cancer predisposition gene included on this panel, her daughter could not have inherited a mutation from her in one of these genes. Individuals in this family might be at some increased risk of developing cancer, over the general population risk, due to the family  history of cancer. We recommend women in this family have a yearly mammogram beginning at age 35, or 42 years younger than the earliest onset of cancer, an annual clinical breast exam, and perform monthly breast self-exams.  Other members of the family may still carry a pathogenic variant in one of these genes that Annette [name] did not inherit. Based on the family history, we recommend her siblings have genetic counseling and testing.  We do not recommend familial testing for the BARD1 variant of uncertain significance (VUS).  FOLLOW-UP:  Cancer genetics is a rapidly advancing field and it is possible that new genetic tests will be appropriate for her and/or her family members in the future. We encouraged her to remain in contact with cancer genetics on an annual  basis so we can update her personal and family histories and let her know of advances in cancer genetics that may benefit this family.   Our contact number was provided. Annette [name] questions were answered to her satisfaction, and she knows she is welcome to call us at anytime with additional questions or concerns.   Lalla Brothers, MS, [facility] Genetic Counselor [email or web address] (P) [phone]

## 2022-05-07 ENCOUNTER — Encounter: Payer: Self-pay | Admitting: Genetic Counselor

## 2022-05-10 ENCOUNTER — Other Ambulatory Visit: Payer: 59 | Admitting: *Deleted

## 2022-05-10 DIAGNOSIS — E7800 Pure hypercholesterolemia, unspecified: Secondary | ICD-10-CM

## 2022-05-10 LAB — HEPATIC FUNCTION PANEL
ALT: 22 IU/L (ref 0–32)
AST: 26 IU/L (ref 0–40)
Albumin: 4.2 g/dL (ref 3.8–4.8)
Alkaline Phosphatase: 66 IU/L (ref 44–121)
Bilirubin Total: 0.3 mg/dL (ref 0.0–1.2)
Bilirubin, Direct: <0.1 mg/dL (ref 0.00–0.40)
Total Protein: 6.5 g/dL (ref 6.0–8.5)

## 2022-05-10 LAB — LIPID PANEL
Chol/HDL Ratio: 2.2 ratio (ref 0.0–4.4)
Cholesterol, Total: 132 mg/dL (ref 100–199)
HDL: 61 mg/dL (ref >39)
LDL Chol Calc (NIH): 55 mg/dL (ref 0–99)
Triglycerides: 83 mg/dL (ref 0–149)
VLDL Cholesterol Cal: 16 mg/dL (ref 5–40)

## 2022-06-02 NOTE — Progress Notes (Signed)
[place] Phone: [phone] Subjective:   Annette [name], am serving as a scribe for Dr. Hulan Saas.   I'm seeing this patient by the request  of:  Biagio Borg, MD  CC: shoulder pain   [id number]  04/12/2022 Patient given injection and tolerated the procedure well, discussed icing regimen and home exercises, which activities to do which ones to avoid, increase activity slowly.  Follow-up again in 6 to 8 weeks.  Injection given today and tolerated the procedure well, discussed icing regimen and home exercise, which activities to do which ones to avoid, increase activity slowly.  Patient also had acromioclavicular arthritis with swelling and we will see how patient responds to the injection.  Work with the flutter trainer.  Follow-up with me again in 6 to 8 weeks patient did have some signs and symptoms concerning for possible early frozen shoulder so hopefully the injections will help this as well.  Updated 06/03/2022 Annette [name] is a 66 y.o. female coming in with complaint of L shoulder pain. Patient last exam found to have more of acromioclavicular joint arthritis and given injection.  The patient was to do home exercises.  Patient states that her range of motion has improved. Pain is less but still present in the deltoid and anterior joint. Painful to abduct and flex arm. Is not doing much weight training.        Past Medical History:  Diagnosis Date   ALLERGIC RHINITIS 10/01/2007   ANXIETY 11/10/2010   ATTENTION DEFICIT DISORDER 11/10/2010   BARTHOLIN'S CYST 08/20/2007   Carpal tunnel syndrome 08/20/2007   COLONIC POLYPS, HX OF 08/20/2007   DEPRESSION 08/20/2007   EMPHYSEMA 06/16/2010   HOT FLASHES 09/26/2009   HYPERLIPIDEMIA 08/20/2007   HYPERTENSION 08/20/2007   Insomnia, unspecified 10/01/2007   ISCHEMIC COLITIS, HX OF 10/01/2007   MENOPAUSAL DISORDER 11/07/2009   OBESITY, MILD 08/20/2007    OSTEOPENIA 06/16/2010   POLYARTHRALGIA 11/06/2008   Raynaud's syndrome 08/20/2007   Past Surgical History:  Procedure Laterality Date   TONSILLECTOMY     Social History   Socioeconomic History   Marital status: Married    Spouse name: Not on file   Number of children: 1   Years of education: Not on file   Highest education level: Not on file  Occupational History    Employer: [employer]  Tobacco Use   Smoking status: Former    Types: Cigarettes    Quit date: 12/27/1990    Years since quitting: 31.4   Smokeless tobacco: Never  Substance and Sexual Activity   Alcohol use: Yes    Comment: one drink per day   Drug use: [name]   Sexual activity: Not on file  Other Topics Concern   Not on file  Social History Narrative   Not on file   Social Determinants of Health   Financial Resource Strain: Not on file  Food Insecurity: Not on file  Transportation Needs: Not on file  Physical Activity: Not on file  Stress: Not on file  Social Connections: Not on file   [name] Known Allergies Family History  Problem Relation Age of Onset   Osteoporosis Mother    Pancreatic cancer Father        dx. 42s   Osteoporosis Maternal Grandmother    Breast cancer Maternal Grandmother        dx. 81s   ADD / ADHD Daughter    Coronary  artery disease Other    Hypertension Other    Prostate cancer Other    Breast cancer Other        dx. 9s   Breast cancer Niece 83   Colon cancer Neg Hx      Current Outpatient Medications (Cardiovascular):    rosuvastatin (CRESTOR) 20 MG tablet, Take 2 tablets (40 mg total) by mouth daily.  Current Outpatient Medications (Respiratory):    albuterol (VENTOLIN HFA) 108 (90 Base) MCG/ACT inhaler, Inhale 2 puffs into the lungs every 6 (six) hours as needed for wheezing or shortness of breath.   fluticasone (FLONASE) 50 MCG/ACT nasal spray, Place 2 sprays into both nostrils daily.  Current Outpatient Medications (Analgesics):    aspirin EC 81 MG tablet,  Take 1 tablet (81 mg total) by mouth daily. Swallow whole.   meloxicam (MOBIC) 15 MG tablet, Take 1 tablet (15 mg total) by mouth daily.   naproxen (NAPROSYN) 500 MG tablet, Take 1 tablet (500 mg total) by mouth 2 (two) times daily as needed.   Current Outpatient Medications (Other):    Estradiol (VAGIFEM) 10 MCG TABS vaginal tablet, Place 1 tablet (10 mcg total) vaginally 2 (two) times a week.   gabapentin (NEURONTIN) 100 MG capsule, Take 2 capsules (200 mg total) by mouth at bedtime.   tiZANidine (ZANAFLEX) 2 MG tablet, Take 1 tablet (2 mg total) by mouth at bedtime.   zolpidem (AMBIEN) 10 MG tablet, Take 1 tablet (10 mg total) by mouth at bedtime as needed. for sleep   Reviewed prior external information including notes and imaging from  primary care provider As well as notes that were available from care everywhere and other healthcare systems.  Past medical history, social, surgical and family history all reviewed in electronic medical record.  [name] pertanent information unless stated regarding to the chief complaint.   Review of Systems:  [name] headache, visual changes, nausea, vomiting, diarrhea, constipation, dizziness, abdominal pain, skin rash, fevers, chills, night sweats, weight loss, swollen lymph nodes, body aches, joint swelling, chest pain, shortness of breath, mood changes. POSITIVE muscle aches   Objective  Blood pressure 98/64, pulse 60, height '5\' 3"'$  (1.6 m), SpO2 99 %.   General: [name] apparent distress alert and oriented x3 mood and affect normal, dressed appropriately.  HEENT: Pupils equal, extraocular movements intact  Respiratory: Patient's speak in full sentences and does not appear short of breath  Cardiovascular: [name] lower extremity edema, non tender, [name] erythema  Gait normal with good balance and coordination.  MSK: Left shoulder exam shows patient does have some weakness noted of the rotator cuff.  4-5 strength noted.  Patient does have significant voluntary guarding  noted. Positive O'Brien's  Limited muscular skeletal ultrasound was performed and interpreted by Hulan Saas, M  Limited ultrasound shows the patient has significant increase in hypoechoic changes of the bicep tendon sheath.  Possible partial tearing noted anteriorly near the anterior labrum with questionable tearing of that as well.  Supraspinatus does have some weakness noted as well. Impression interval worsening of the shoulder significantly    Impression and Recommendations:     The above documentation has been reviewed and is accurate and complete Lyndal Pulley, DO

## 2022-06-03 ENCOUNTER — Encounter: Payer: Self-pay | Admitting: Family Medicine

## 2022-06-03 ENCOUNTER — Ambulatory Visit (INDEPENDENT_AMBULATORY_CARE_PROVIDER_SITE_OTHER): Payer: 59 | Admitting: Family Medicine

## 2022-06-03 ENCOUNTER — Ambulatory Visit: Payer: Self-pay

## 2022-06-03 VITALS — BP 98/64 | HR 60 | Ht 63.0 in

## 2022-06-03 DIAGNOSIS — M25512 Pain in left shoulder: Secondary | ICD-10-CM

## 2022-06-03 DIAGNOSIS — M7552 Bursitis of left shoulder: Secondary | ICD-10-CM | POA: Diagnosis not present

## 2022-06-03 MED ORDER — TIZANIDINE HCL 2 MG PO TABS: 2.0000 mg | ORAL_TABLET | Freq: Every day | ORAL | 0 refills | Status: DC

## 2022-06-03 MED ORDER — NAPROXEN 500 MG PO TABS: 500.0000 mg | ORAL_TABLET | Freq: Two times a day (BID) | ORAL | 1 refills | Status: DC | PRN

## 2022-06-03 NOTE — Patient Instructions (Addendum)
MRA L shoulder [id number] Naproxen '500mg'$  2x a day  Zanaflex '2mg'$  at night Stop IBU I will write to you when I get results

## 2022-06-03 NOTE — Assessment & Plan Note (Addendum)
Concerned now that there is no significant hypoechoic changes the patient does have significant tearing noted of the supraspinatus, possible partial bicep tendon anteriorly and concern for a labral pathology with positive O'Brien's and what is seen on ultrasound.  Will need MRI to further confirm that patient could potentially still do well with conservative therapy or if surgical intervention is necessary.  Patient has failed now nearly 12 weeks of therapy, home exercises, anti-inflammatories.  Change anti-inflammatory to topical at this moment secondary to patient's chronic disease.  And given a muscle relaxer to help with some of the nighttime pain as well.  Addendum: Athletic trainer [place] patient to tell her to not take the prescription that we called in for the anti-inflammatories with patient's chronic kidney disease at the moment seem to be worsening.  Discussed topical anti-inflammatories and discussed Tylenol for now.

## 2022-06-23 ENCOUNTER — Ambulatory Visit
Admission: RE | Admit: 2022-06-23 | Discharge: 2022-06-23 | Disposition: A | Discharge status: Home / Self Care | Payer: 59 | Source: Ambulatory Visit | Attending: Family Medicine | Admitting: Family Medicine

## 2022-06-23 DIAGNOSIS — M25512 Pain in left shoulder: Secondary | ICD-10-CM

## 2022-06-23 MED ORDER — IOPAMIDOL (ISOVUE-M 200) INJECTION 41%
15.0000 mL | Freq: Once | INTRAMUSCULAR | Status: AC
  Administered 2022-06-23: 15 mL via INTRA_ARTICULAR

## 2022-07-04 ENCOUNTER — Other Ambulatory Visit: Payer: Self-pay | Admitting: Family Medicine

## 2022-07-13 NOTE — Progress Notes (Unsigned)
Annette [name] Phone: [phone] Subjective:   Annette [name], am serving as a scribe for Dr. Hulan Saas.   I'm seeing this patient by the request  of:  Biagio Borg, MD  CC: Left shoulder pain follow-up  [id number]  06/03/2022 Concerned now that there is [name] significant hypoechoic changes the patient does have significant tearing noted of the supraspinatus, possible partial bicep tendon anteriorly and concern for a labral pathology with positive O'Brien's and what is seen on ultrasound.  Will need MRI to further confirm that patient could potentially still do well with conservative therapy or if surgical intervention is necessary.  Patient has failed now nearly 12 weeks of therapy, home exercises, anti-inflammatories.  Change anti-inflammatory to topical at this moment secondary to patient's chronic disease.  And given a muscle relaxer to help with some of the nighttime pain as well.    Update 07/14/2022 Annette [name] is a 67 y.o. female coming in with complaint of L shoulder pain. Patient here for PRP today. Patient states that her arm is not as painful but when she reaches out to the side or tries to put hand behind her back her pain increases. Also experiencing tight trap.        Past Medical History:  Diagnosis Date   ALLERGIC RHINITIS 10/01/2007   ANXIETY 11/10/2010   ATTENTION DEFICIT DISORDER 11/10/2010   BARTHOLIN'S CYST 08/20/2007   Carpal tunnel syndrome 08/20/2007   COLONIC POLYPS, HX OF 08/20/2007   DEPRESSION 08/20/2007   EMPHYSEMA 06/16/2010   HOT FLASHES 09/26/2009   HYPERLIPIDEMIA 08/20/2007   HYPERTENSION 08/20/2007   Insomnia, unspecified 10/01/2007   ISCHEMIC COLITIS, HX OF 10/01/2007   MENOPAUSAL DISORDER 11/07/2009   OBESITY, MILD 08/20/2007   OSTEOPENIA 06/16/2010   POLYARTHRALGIA 11/06/2008   Raynaud's syndrome 08/20/2007   Past Surgical History:  Procedure Laterality Date    TONSILLECTOMY     Social History   Socioeconomic History   Marital status: Married    Spouse name: Not on file   Number of children: 1   Years of education: Not on file   Highest education level: Not on file  Occupational History    Employer: [employer]  Tobacco Use   Smoking status: Former    Types: Cigarettes    Quit date: 12/27/1990    Years since quitting: 31.5   Smokeless tobacco: Never  Substance and Sexual Activity   Alcohol use: Yes    Comment: one drink per day   Drug use: [name]   Sexual activity: Not on file  Other Topics Concern   Not on file  Social History Narrative   Not on file   Social Determinants of Health   Financial Resource Strain: Not on file  Food Insecurity: Not on file  Transportation Needs: Not on file  Physical Activity: Not on file  Stress: Not on file  Social Connections: Not on file   [name] Known Allergies Family History  Problem Relation Age of Onset   Osteoporosis Mother    Pancreatic cancer Father        dx. 66s   Osteoporosis Maternal Grandmother    Breast cancer Maternal Grandmother        dx. 58s   ADD / ADHD Daughter    Coronary artery disease Other    Hypertension Other    Prostate cancer Other    Breast cancer Other  dx. 71s   Breast cancer Niece 39   Colon cancer Neg Hx      Current Outpatient Medications (Cardiovascular):    rosuvastatin (CRESTOR) 20 MG tablet, Take 2 tablets (40 mg total) by mouth daily.  Current Outpatient Medications (Respiratory):    albuterol (VENTOLIN HFA) 108 (90 Base) MCG/ACT inhaler, Inhale 2 puffs into the lungs every 6 (six) hours as needed for wheezing or shortness of breath.   fluticasone (FLONASE) 50 MCG/ACT nasal spray, Place 2 sprays into both nostrils daily.  Current Outpatient Medications (Analgesics):    aspirin EC 81 MG tablet, Take 1 tablet (81 mg total) by mouth daily. Swallow whole.   Current Outpatient Medications (Other):    Estradiol (VAGIFEM) 10 MCG TABS  vaginal tablet, Place 1 tablet (10 mcg total) vaginally 2 (two) times a week.   gabapentin (NEURONTIN) 100 MG capsule, Take 2 capsules (200 mg total) by mouth at bedtime.   tiZANidine (ZANAFLEX) 2 MG tablet, TAKE 1 TABLET BY MOUTH AT BEDTIME.   zolpidem (AMBIEN) 10 MG tablet, Take 1 tablet (10 mg total) by mouth at bedtime as needed. for sleep   Objective  Blood pressure 102/72, pulse 70, height '5\' 3"'$  (1.6 m), weight 169 lb (76.7 kg), SpO2 98 %.   General: [name] apparent distress alert and oriented x3 mood and affect normal, dressed appropriately.  HEENT: Pupils equal, extraocular movements intact  Respiratory: Patient's speak in full sentences and does not appear short of breath  Cardiovascular: [name] lower extremity edema, non tender, [name] erythema  Left shoulder exam does have some impingement noted.  Procedure: Real-time Ultrasound Guided Injection of left subacromial space into the supraspinatus tendon  Device: GE Logiq E  Ultrasound guided injection is preferred based studies that show increased duration, increased effect, greater accuracy, decreased procedural pain, increased response rate with ultrasound guided versus blind injection.  Verbal informed consent obtained.  Time-out conducted.  Noted [name] overlying erythema, induration, or other signs of local infection.  Skin prepped in a sterile fashion.  Local anesthesia: Topical Ethyl chloride.  With sterile technique and under real time ultrasound guidance:  Joint visualized.  21g 2 inch needle inserted posterior approach. Pictures taken for needle placement. Patient did have injection of 2 cc of 0.5% Marcaine, and then injected 5 cc of PRP leukocyte rich Completed without difficulty  Pain immediately resolved suggesting accurate placement of the medication.  Advised to call if fevers/chills, erythema, induration, drainage, or persistent bleeding.   Impression: Technically successful ultrasound guided injection.    Impression and  Recommendations:    The above documentation has been reviewed and is accurate and complete Lyndal Pulley, DO

## 2022-07-14 ENCOUNTER — Ambulatory Visit: Payer: Self-pay

## 2022-07-14 ENCOUNTER — Ambulatory Visit (INDEPENDENT_AMBULATORY_CARE_PROVIDER_SITE_OTHER): Payer: Self-pay | Admitting: Family Medicine

## 2022-07-14 VITALS — BP 102/72 | HR 70 | Ht 63.0 in | Wt 169.0 lb

## 2022-07-14 DIAGNOSIS — G8929 Other chronic pain: Secondary | ICD-10-CM

## 2022-07-14 DIAGNOSIS — M25512 Pain in left shoulder: Secondary | ICD-10-CM

## 2022-07-14 DIAGNOSIS — M75102 Unspecified rotator cuff tear or rupture of left shoulder, not specified as traumatic: Secondary | ICD-10-CM

## 2022-07-14 NOTE — Patient Instructions (Signed)
No ice or IBU for 3 days Heat and Tylenol are ok See me again in 6 weeks 

## 2022-07-14 NOTE — Assessment & Plan Note (Signed)
Patient tolerated the procedure very well.  Discussed icing regimen and home exercises otherwise.  We discussed avoiding the icing for the first 3 days.  Post PRP protocol given for patient.  Discussed which activities to do which ones to avoid otherwise.  Follow-up again in 5 to 6 weeks for further evaluation and likely to restart osteopathic manipulation.

## 2022-07-19 ENCOUNTER — Other Ambulatory Visit: Payer: Self-pay | Admitting: Family Medicine

## 2022-08-24 NOTE — Progress Notes (Unsigned)
Annette [name] [address] [place] Phone: [phone] Subjective:   IVilma [name], am serving as a scribe for Dr. Hulan Saas.  I'm seeing this patient by the request  of:  Biagio Borg, MD  CC: shoulder and back pain follow up   [id number]  07/14/2022 Patient tolerated the procedure very well.  Discussed icing regimen and home exercises otherwise.  We discussed avoiding the icing for the first 3 days.  Post PRP protocol given for patient.  Discussed which activities to do which ones to avoid otherwise.  Follow-up again in 5 to 6 weeks for further evaluation and likely to restart osteopathic manipulation.  Updated 08/25/2022 LATARSHA [name] is a 66 y.o. female coming in with complaint of shoulder pain. PRP f/u. Maybe manipulation. ROM is better and pain intensity has gone down. Still has some pain depending on activity. Just a shoulder f/u.       Past Medical History:  Diagnosis Date   ALLERGIC RHINITIS 10/01/2007   ANXIETY 11/10/2010   ATTENTION DEFICIT DISORDER 11/10/2010   BARTHOLIN'S CYST 08/20/2007   Carpal tunnel syndrome 08/20/2007   COLONIC POLYPS, HX OF 08/20/2007   DEPRESSION 08/20/2007   EMPHYSEMA 06/16/2010   HOT FLASHES 09/26/2009   HYPERLIPIDEMIA 08/20/2007   HYPERTENSION 08/20/2007   Insomnia, unspecified 10/01/2007   ISCHEMIC COLITIS, HX OF 10/01/2007   MENOPAUSAL DISORDER 11/07/2009   OBESITY, MILD 08/20/2007   OSTEOPENIA 06/16/2010   POLYARTHRALGIA 11/06/2008   Raynaud's syndrome 08/20/2007   Past Surgical History:  Procedure Laterality Date   TONSILLECTOMY     Social History   Socioeconomic History   Marital status: Married    Spouse name: Not on file   Number of children: 1   Years of education: Not on file   Highest education level: Not on file  Occupational History    Employer: [employer]  Tobacco Use   Smoking status: Former    Types: Cigarettes    Quit date: 12/27/1990    Years since  quitting: 31.6   Smokeless tobacco: Never  Substance and Sexual Activity   Alcohol use: Yes    Comment: one drink per day   Drug use: No   Sexual activity: Not on file  Other Topics Concern   Not on file  Social History Narrative   Not on file   Social Determinants of Health   Financial Resource Strain: Not on file  Food Insecurity: Not on file  Transportation Needs: Not on file  Physical Activity: Not on file  Stress: Not on file  Social Connections: Not on file   No Known Allergies Family History  Problem Relation Age of Onset   Osteoporosis Mother    Pancreatic cancer Father        dx. 36s   Osteoporosis Maternal Grandmother    Breast cancer Maternal Grandmother        dx. 36s   ADD / ADHD Daughter    Coronary artery disease Other    Hypertension Other    Prostate cancer Other    Breast cancer Other        dx. 63s   Breast cancer Niece 65   Colon cancer Neg Hx      Current Outpatient Medications (Cardiovascular):    rosuvastatin (CRESTOR) 20 MG tablet, Take 2 tablets (40 mg total) by mouth daily.  Current Outpatient Medications (Respiratory):    albuterol (VENTOLIN HFA) 108 (90 Base) MCG/ACT inhaler, Inhale 2 puffs  into the lungs every 6 (six) hours as needed for wheezing or shortness of breath.   fluticasone (FLONASE) 50 MCG/ACT nasal spray, Place 2 sprays into both nostrils daily.  Current Outpatient Medications (Analgesics):    aspirin EC 81 MG tablet, Take 1 tablet (81 mg total) by mouth daily. Swallow whole.   Current Outpatient Medications (Other):    Estradiol (VAGIFEM) 10 MCG TABS vaginal tablet, Place 1 tablet (10 mcg total) vaginally 2 (two) times a week.   gabapentin (NEURONTIN) 100 MG capsule, Take 2 capsules (200 mg total) by mouth at bedtime.   tiZANidine (ZANAFLEX) 2 MG tablet, TAKE 1 TABLET BY MOUTH EVERYDAY AT BEDTIME   zolpidem (AMBIEN) 10 MG tablet, Take 1 tablet (10 mg total) by mouth at bedtime as needed. for sleep   Reviewed prior  external information including notes and imaging from  primary care provider As well as notes that were available from care everywhere and other healthcare systems.  Past medical history, social, surgical and family history all reviewed in electronic medical record.  No pertanent information unless stated regarding to the chief complaint.   Review of Systems:  No headache, visual changes, nausea, vomiting, diarrhea, constipation, dizziness, abdominal pain, skin rash, fevers, chills, night sweats, weight loss, swollen lymph nodes, body aches, joint swelling, chest pain, shortness of breath, mood changes. POSITIVE muscle aches  Objective  Blood pressure 108/66, pulse 69, height '5\' 3"'$  (1.6 m), weight 168 lb (76.2 kg), SpO2 96 %.   General: No apparent distress alert and oriented x3 mood and affect normal, dressed appropriately.  HEENT: Pupils equal, extraocular movements intact  Respiratory: Patient's speak in full sentences and does not appear short of breath  Cardiovascular: No lower extremity edema, non tender, no erythema  Left shoulder exam shows the patient does have tenderness to palpation still noted.  Positive crossover noted.  5-5 strength though noted.  Patient though does have pain with empty can.  Procedure: Real-time Ultrasound Guided Injection of left acromioclavicular joint Device: GE Logiq Q7 Ultrasound guided injection is preferred based studies that show increased duration, increased effect, greater accuracy, decreased procedural pain, increased response rate, and decreased cost with ultrasound guided versus blind injection.  Verbal informed consent obtained.  Time-out conducted.  Noted no overlying erythema, induration, or other signs of local infection.  Skin prepped in a sterile fashion.  Local anesthesia: Topical Ethyl chloride.  With sterile technique and under real time ultrasound guidance: With a 25-gauge half inch needle injecting 0.5 cc of 0.5% Marcaine and 0.5 cc  of Kenalog 40 mg/mL Completed without difficulty  Pain immediately resolved suggesting accurate placement of the medication.  Advised to call if fevers/chills, erythema, induration, drainage, or persistent bleeding.  Impression: Technically successful ultrasound guided injection.   Osteopathic findings C4 flexed rotated and side bent left T9 extended rotated and side bent left inhaled rib L4 flexed rotated and side bent right Sacrum right on right    Impression and Recommendations:     The above documentation has been reviewed and is accurate and complete Lyndal Pulley, DO

## 2022-08-25 ENCOUNTER — Ambulatory Visit (INDEPENDENT_AMBULATORY_CARE_PROVIDER_SITE_OTHER): Payer: 59 | Admitting: Family Medicine

## 2022-08-25 ENCOUNTER — Ambulatory Visit: Payer: Self-pay

## 2022-08-25 VITALS — BP 108/66 | HR 69 | Ht 63.0 in | Wt 168.0 lb

## 2022-08-25 DIAGNOSIS — M533 Sacrococcygeal disorders, not elsewhere classified: Secondary | ICD-10-CM

## 2022-08-25 DIAGNOSIS — G8929 Other chronic pain: Secondary | ICD-10-CM | POA: Diagnosis not present

## 2022-08-25 DIAGNOSIS — M9904 Segmental and somatic dysfunction of sacral region: Secondary | ICD-10-CM | POA: Diagnosis not present

## 2022-08-25 DIAGNOSIS — M9903 Segmental and somatic dysfunction of lumbar region: Secondary | ICD-10-CM | POA: Diagnosis not present

## 2022-08-25 DIAGNOSIS — M999 Biomechanical lesion, unspecified: Secondary | ICD-10-CM | POA: Diagnosis not present

## 2022-08-25 DIAGNOSIS — M9908 Segmental and somatic dysfunction of rib cage: Secondary | ICD-10-CM | POA: Diagnosis not present

## 2022-08-25 DIAGNOSIS — M25512 Pain in left shoulder: Secondary | ICD-10-CM

## 2022-08-25 DIAGNOSIS — M75102 Unspecified rotator cuff tear or rupture of left shoulder, not specified as traumatic: Secondary | ICD-10-CM | POA: Diagnosis not present

## 2022-08-25 DIAGNOSIS — M9901 Segmental and somatic dysfunction of cervical region: Secondary | ICD-10-CM

## 2022-08-25 DIAGNOSIS — M9902 Segmental and somatic dysfunction of thoracic region: Secondary | ICD-10-CM

## 2022-08-25 DIAGNOSIS — M25519 Pain in unspecified shoulder: Secondary | ICD-10-CM | POA: Insufficient documentation

## 2022-08-25 NOTE — Assessment & Plan Note (Signed)

## 2022-08-25 NOTE — Assessment & Plan Note (Signed)
On imaging patient has had minimal improvement.  Seems to be making some improvement overall.  Still thinks that she can possibly avoid surgery and I am hopeful of this.  Encouraged activity as well as weight lifting minorly.  Follow-up again in 6 to 8 weeks

## 2022-08-25 NOTE — Assessment & Plan Note (Signed)
Patient given injection today for therapeutic and diagnostic purposes.  Responded extremely well to the injection.  Patient had improvement in range of motion and decrease in pain almost immediately.  Patient's PRP of the small rotator cuff tear that was previously noted on MRI does show improvement.  Optimistic that patient will continue to make improvement.  Also started with osteopathic manipulation follow-up with me again in 6 to 8 weeks.  Has muscle relaxer and gabapentin for breakthrough

## 2022-08-25 NOTE — Assessment & Plan Note (Signed)
Patient has not been as active as usual.  Attempted osteopathic manipulation.  Had some good resolution of some of the discomfort and pain as well.  Able to make some improvement in neck range of motion as well.  Follow-up again in 6 to 8 weeks

## 2022-08-25 NOTE — Patient Instructions (Addendum)
Injection in shoulder today AC joint Rotator cuff looks like it's improving well

## 2022-10-04 ENCOUNTER — Encounter: Payer: Self-pay | Admitting: *Deleted

## 2022-10-04 ENCOUNTER — Telehealth: Payer: Self-pay | Admitting: *Deleted

## 2022-10-04 NOTE — Telephone Encounter (Signed)
Letter has been sent to patient informing them that their sleep study has expired. Patient will need to call and schedule an office visit to re-evaluate the need for a sleep study.    

## 2022-10-07 NOTE — Progress Notes (Unsigned)
  Annette [name] Phone: [phone] Subjective:   Annette [name], am serving as a scribe for Dr. Hulan Saas.  I'm seeing this patient by the request  of:  Biagio Borg, MD  CC: back and neck pain   OLI:[id number]  Annette [name] is a 66 y.o. female coming in with complaint of back and neck pain. OMT on 08/25/2022. Also seen for L shoulder pain. Was given injection last visit. Patient states that her shoulder pain has improved. Feels 75%. Continues to have stress in the trap muscles. Has not had to use mm relaxer.   Medications patient has been prescribed: Zanaflex  Taking:         Reviewed prior external information including notes and imaging from previsou exam, outside providers and external EMR if available.   As well as notes that were available from care everywhere and other healthcare systems.  Past medical history, social, surgical and family history all reviewed in electronic medical record.  [name] pertanent information unless stated regarding to the chief complaint.     Review of Systems:  [name] headache, visual changes, nausea, vomiting, diarrhea, constipation, dizziness, abdominal pain, skin rash, fevers, chills, night sweats, weight loss, swollen lymph nodes, body aches, joint swelling, chest pain, shortness of breath, mood changes. POSITIVE muscle aches  Objective  Blood pressure 102/76, pulse (!) 57, height '5\' 3"'$  (1.6 m), weight 169 lb (76.7 kg), SpO2 98 %.   General: [name] apparent distress alert and oriented x3 mood and affect normal, dressed appropriately.  HEENT: Pupils equal, extraocular movements intact  Respiratory: Patient's speak in full sentences and does not appear short of breath   Back low back exam does have [name] significant tenderness noted today.  Patient's left shoulder does have improvement in range of motion.  Limited muscular skeletal ultrasound was performed and interpreted by  Hulan Saas, M  Limited ultrasound of patient's acromioclavicular joint still shows some very mild hypoechoic changes in the underlying moderate arthritic changes.      Assessment and Plan:  AC joint pain Much of after the injection.  Discussed keeping hands within peripheral vision.  Secondary to the underlying send will be a chronic problem  LUMBAR RADICULOPATHY, LEFT Patient has had difficulty with back pain also chronic but stable at the moment.  Did not need any osteopathic manipulation.  We will continue with current therapy at this time.   \     The above documentation has been reviewed and is accurate and complete Lyndal Pulley, DO          Note: This dictation was prepared with Dragon dictation along with smaller phrase technology. Any transcriptional errors that result from this process are unintentional.

## 2022-10-13 ENCOUNTER — Ambulatory Visit: Payer: Self-pay

## 2022-10-13 ENCOUNTER — Ambulatory Visit (INDEPENDENT_AMBULATORY_CARE_PROVIDER_SITE_OTHER): Payer: 59 | Admitting: Family Medicine

## 2022-10-13 ENCOUNTER — Encounter: Payer: Self-pay | Admitting: Family Medicine

## 2022-10-13 VITALS — BP 102/76 | HR 57 | Ht 63.0 in | Wt 169.0 lb

## 2022-10-13 DIAGNOSIS — M25512 Pain in left shoulder: Secondary | ICD-10-CM

## 2022-10-13 DIAGNOSIS — G8929 Other chronic pain: Secondary | ICD-10-CM

## 2022-10-13 NOTE — Patient Instructions (Signed)
Glad you're doing well Keep hands in peripheral vision 3 months follow up if needed

## 2022-10-13 NOTE — Assessment & Plan Note (Signed)
Patient has had difficulty with back pain also chronic but stable at the moment.  Did not need any osteopathic manipulation.  We will continue with current therapy at this time.

## 2022-10-13 NOTE — Assessment & Plan Note (Signed)
Much of after the injection.  Discussed keeping hands within peripheral vision.  Secondary to the underlying send will be a chronic problem

## 2023-01-05 ENCOUNTER — Ambulatory Visit: Payer: 59 | Admitting: Family Medicine

## 2023-01-31 ENCOUNTER — Telehealth: Payer: Self-pay | Admitting: Internal Medicine

## 2023-01-31 DIAGNOSIS — E559 Vitamin D deficiency, unspecified: Secondary | ICD-10-CM

## 2023-01-31 DIAGNOSIS — R739 Hyperglycemia, unspecified: Secondary | ICD-10-CM

## 2023-01-31 DIAGNOSIS — E538 Deficiency of other specified B group vitamins: Secondary | ICD-10-CM

## 2023-01-31 DIAGNOSIS — E7800 Pure hypercholesterolemia, unspecified: Secondary | ICD-10-CM

## 2023-01-31 NOTE — Telephone Encounter (Signed)
Ok labs are ordered 

## 2023-01-31 NOTE — Telephone Encounter (Signed)
Notified pt MD placed labs. Will come tomorrow to have done.Marland KitchenJohny Chess

## 2023-01-31 NOTE — Telephone Encounter (Signed)
Patient has her physical scheduled for this Wednesday - she would like her lab orders put in today so she can get her labs drawn.  Please call patient and let her know.  Patients number:  [phone]

## 2023-02-01 ENCOUNTER — Other Ambulatory Visit (INDEPENDENT_AMBULATORY_CARE_PROVIDER_SITE_OTHER): Payer: 59

## 2023-02-01 DIAGNOSIS — R739 Hyperglycemia, unspecified: Secondary | ICD-10-CM

## 2023-02-01 DIAGNOSIS — E7800 Pure hypercholesterolemia, unspecified: Secondary | ICD-10-CM | POA: Diagnosis not present

## 2023-02-01 DIAGNOSIS — E538 Deficiency of other specified B group vitamins: Secondary | ICD-10-CM

## 2023-02-01 DIAGNOSIS — E559 Vitamin D deficiency, unspecified: Secondary | ICD-10-CM

## 2023-02-01 LAB — CBC WITH DIFFERENTIAL/PLATELET
Basophils Absolute: 0 10*3/uL (ref 0.0–0.1)
Basophils Relative: 0.7 % (ref 0.0–3.0)
Eosinophils Absolute: 0.1 10*3/uL (ref 0.0–0.7)
Eosinophils Relative: 2.2 % (ref 0.0–5.0)
HCT: 39.3 % (ref 36.0–46.0)
Hemoglobin: 13 g/dL (ref 12.0–15.0)
Lymphocytes Relative: 44.9 % (ref 12.0–46.0)
Lymphs Abs: 2.8 10*3/uL (ref 0.7–4.0)
MCHC: 33 g/dL (ref 30.0–36.0)
MCV: 86.1 fl (ref 78.0–100.0)
Monocytes Absolute: 0.4 10*3/uL (ref 0.1–1.0)
Monocytes Relative: 6.5 % (ref 3.0–12.0)
Neutro Abs: 2.9 10*3/uL (ref 1.4–7.7)
Neutrophils Relative %: 45.7 % (ref 43.0–77.0)
Platelets: 210 10*3/uL (ref 150.0–400.0)
RBC: 4.56 Mil/uL (ref 3.87–5.11)
RDW: 14.1 % (ref 11.5–15.5)
WBC: 6.3 10*3/uL (ref 4.0–10.5)

## 2023-02-01 LAB — BASIC METABOLIC PANEL
BUN: 16 mg/dL (ref 6–23)
CO2: 22 mEq/L (ref 19–32)
Calcium: 9.3 mg/dL (ref 8.4–10.5)
Chloride: 107 mEq/L (ref 96–112)
Creatinine, Ser: 1.05 mg/dL (ref 0.40–1.20)
GFR: 56.2 mL/min — ABNORMAL LOW (ref >60.00)
Glucose, Bld: 103 mg/dL — ABNORMAL HIGH (ref 70–99)
Potassium: 4.3 mEq/L (ref 3.5–5.1)
Sodium: 140 mEq/L (ref 135–145)

## 2023-02-01 LAB — URINALYSIS, ROUTINE W REFLEX MICROSCOPIC
Bilirubin Urine: NEGATIVE
Hgb urine dipstick: NEGATIVE
Ketones, ur: NEGATIVE
Nitrite: NEGATIVE
Specific Gravity, Urine: 1.025 (ref 1.000–1.030)
Total Protein, Urine: NEGATIVE
Urine Glucose: NEGATIVE
Urobilinogen, UA: 0.2 (ref 0.0–1.0)
pH: 5.5 (ref 5.0–8.0)

## 2023-02-01 LAB — LIPID PANEL
Cholesterol: 126 mg/dL (ref 0–200)
HDL: 50.3 mg/dL (ref >39.00)
LDL Cholesterol: 59 mg/dL (ref 0–99)
NonHDL: 75.89
Total CHOL/HDL Ratio: 3
Triglycerides: 86 mg/dL (ref 0.0–149.0)
VLDL: 17.2 mg/dL (ref 0.0–40.0)

## 2023-02-01 LAB — TSH: TSH: 2.36 u[IU]/mL (ref 0.35–5.50)

## 2023-02-01 LAB — HEPATIC FUNCTION PANEL
ALT: 19 U/L (ref 0–35)
AST: 21 U/L (ref 0–37)
Albumin: 4.3 g/dL (ref 3.5–5.2)
Alkaline Phosphatase: 62 U/L (ref 39–117)
Bilirubin, Direct: 0.1 mg/dL (ref 0.0–0.3)
Total Bilirubin: 0.4 mg/dL (ref 0.2–1.2)
Total Protein: 7 g/dL (ref 6.0–8.3)

## 2023-02-01 LAB — VITAMIN D 25 HYDROXY (VIT D DEFICIENCY, FRACTURES): VITD: 28.14 ng/mL — ABNORMAL LOW (ref 30.00–100.00)

## 2023-02-01 LAB — VITAMIN B12: Vitamin B-12: 671 pg/mL (ref 211–911)

## 2023-02-01 LAB — HEMOGLOBIN A1C: Hgb A1c MFr Bld: 6 % (ref 4.6–6.5)

## 2023-02-03 ENCOUNTER — Encounter: Payer: Self-pay | Admitting: Internal Medicine

## 2023-02-03 ENCOUNTER — Other Ambulatory Visit (INDEPENDENT_AMBULATORY_CARE_PROVIDER_SITE_OTHER): Payer: 59

## 2023-02-03 ENCOUNTER — Ambulatory Visit (INDEPENDENT_AMBULATORY_CARE_PROVIDER_SITE_OTHER): Payer: 59 | Admitting: Internal Medicine

## 2023-02-03 VITALS — BP 122/78 | HR 53 | Temp 99.1°F | Ht 63.0 in | Wt 169.0 lb

## 2023-02-03 DIAGNOSIS — R1030 Lower abdominal pain, unspecified: Secondary | ICD-10-CM

## 2023-02-03 DIAGNOSIS — E538 Deficiency of other specified B group vitamins: Secondary | ICD-10-CM

## 2023-02-03 DIAGNOSIS — Z0001 Encounter for general adult medical examination with abnormal findings: Secondary | ICD-10-CM

## 2023-02-03 DIAGNOSIS — N1831 Chronic kidney disease, stage 3a: Secondary | ICD-10-CM

## 2023-02-03 DIAGNOSIS — N898 Other specified noninflammatory disorders of vagina: Secondary | ICD-10-CM | POA: Insufficient documentation

## 2023-02-03 DIAGNOSIS — E559 Vitamin D deficiency, unspecified: Secondary | ICD-10-CM | POA: Diagnosis not present

## 2023-02-03 DIAGNOSIS — Z86010 Personal history of colonic polyps: Secondary | ICD-10-CM | POA: Insufficient documentation

## 2023-02-03 DIAGNOSIS — H6121 Impacted cerumen, right ear: Secondary | ICD-10-CM | POA: Diagnosis not present

## 2023-02-03 DIAGNOSIS — E7800 Pure hypercholesterolemia, unspecified: Secondary | ICD-10-CM

## 2023-02-03 DIAGNOSIS — R739 Hyperglycemia, unspecified: Secondary | ICD-10-CM

## 2023-02-03 DIAGNOSIS — D259 Leiomyoma of uterus, unspecified: Secondary | ICD-10-CM | POA: Insufficient documentation

## 2023-02-03 DIAGNOSIS — R252 Cramp and spasm: Secondary | ICD-10-CM | POA: Insufficient documentation

## 2023-02-03 DIAGNOSIS — I10 Essential (primary) hypertension: Secondary | ICD-10-CM

## 2023-02-03 MED ORDER — ROSUVASTATIN CALCIUM 20 MG PO TABS: 20.0000 mg | ORAL_TABLET | Freq: Every day | ORAL | 3 refills | Status: DC

## 2023-02-03 MED ORDER — ALBUTEROL SULFATE HFA 108 (90 BASE) MCG/ACT IN AERS: 2.0000 | INHALATION_SPRAY | Freq: Four times a day (QID) | RESPIRATORY_TRACT | 3 refills | Status: DC | PRN

## 2023-02-03 MED ORDER — ZOLPIDEM TARTRATE 10 MG PO TABS: 10.0000 mg | ORAL_TABLET | Freq: Every evening | ORAL | 1 refills | Status: DC | PRN

## 2023-02-03 NOTE — Progress Notes (Signed)
Patient ID: Annette [name], female   DOB: Feb 15, 1956, 67 y.o.   MRN: [id number]         Chief Complaint:: wellness exam and lower abd pain, low vit d, ckd 3a, htn, hld, hyperglycemia       HPI:  Annette [name] is a 67 y.o. female here for wellness exam; declines tdap and ful shot, o/w up to date                        Also c/o 1 wk mild but persistent lower abd pain, Denies urinary symptoms such as dysuria, frequency, urgency, flank pain, hematuria or n/v, fever, chills.  Denies worsening reflux, abd pain, dysphagia, n/v, bowel change or blood.  Pt denies chest pain, increased sob or doe, wheezing, orthopnea, PND, increased LE swelling, palpitations, dizziness or syncope.   Pt denies polydipsia, polyuria, or new focal neuro s/s.    Pt denies fever, wt loss, night sweats, loss of appetite, or other constitutional symptoms  Also has wax impaction right ear   Wt Readings from Last 3 Encounters:  02/03/23 169 lb (76.7 kg)  10/13/22 169 lb (76.7 kg)  08/25/22 168 lb (76.2 kg)   BP Readings from Last 3 Encounters:  02/03/23 122/78  10/13/22 102/76  08/25/22 108/66   Immunization History  Administered Date(s) Administered   COVID-19, mRNA, vaccine(Comirnaty)12 years and older 10/16/2022   Influenza Inj Mdck Quad With Preservative 09/27/2019   Influenza Split 11/27/58   Influenza Whole 10/08/2008   Influenza, Quadrivalent, Recombinant, Inj, Pf 10/05/2019   Influenza, Seasonal, Injecte, Preservative Fre 09/26/2013   Influenza,inj,Quad PF,6+ Mos 09/26/2021   Influenza-Unspecified 09/27/2015, 10/26/2015, 09/28/2016, 09/26/2018, 08/27/2020, 09/29/2020   PFIZER Comirnaty(Gray Top)Covid-19 Tri-Sucrose Vaccine 03/08/2020, 03/30/2020, 10/30/2020   Pfizer Covid-19 Vaccine Bivalent Booster 80yr & up 09/09/2021   Td 11/10/2010   Zoster Recombinat (Shingrix) 02/10/2018, 04/14/2018   Health Maintenance Due  Topic Date Due   DTaP/Tdap/Td (2 - Tdap) 11/10/2020      Past Medical History:   Diagnosis Date   ALLERGIC RHINITIS 10/01/2007   ANXIETY 11/10/2010   ATTENTION DEFICIT DISORDER 11/10/2010   BARTHOLIN'S CYST 08/20/2007   Carpal tunnel syndrome 08/20/2007   COLONIC POLYPS, HX OF 08/20/2007   DEPRESSION 08/20/2007   EMPHYSEMA 06/16/2010   HOT FLASHES 09/26/2009   HYPERLIPIDEMIA 08/20/2007   HYPERTENSION 08/20/2007   Insomnia, unspecified 10/01/2007   ISCHEMIC COLITIS, HX OF 10/01/2007   MENOPAUSAL DISORDER 11/07/2009   OBESITY, MILD 08/20/2007   OSTEOPENIA 06/16/2010   POLYARTHRALGIA 11/06/2008   Raynaud's syndrome 08/20/2007   Past Surgical History:  Procedure Laterality Date   TONSILLECTOMY      reports that she quit smoking about 32 years ago. Her smoking use included cigarettes. She has never used smokeless tobacco. She reports current alcohol use. She reports that she does not use drugs. family history includes ADD / ADHD in her daughter; Breast cancer in her maternal grandmother and another family member; Breast cancer (age of onset: 423 in her niece; Coronary artery disease in an other family member; Hypertension in an other family member; Osteoporosis in her maternal grandmother and mother; Pancreatic cancer in her father; Prostate cancer in an other family member. No Known Allergies Current Outpatient Medications on File Prior to Visit  Medication Sig Dispense Refill   aspirin EC 81 MG tablet Take 1 tablet (81 mg total) by mouth daily. Swallow whole. 90 tablet 3   Estradiol (VAGIFEM) 10 MCG TABS vaginal tablet Place  1 tablet (10 mcg total) vaginally 2 (two) times a week. 24 tablet 3   fluticasone (FLONASE) 50 MCG/ACT nasal spray Place 2 sprays into both nostrils daily. 16 g 6   No current facility-administered medications on file prior to visit.        ROS:  All others reviewed and negative.  Objective        PE:  BP 122/78 (BP Location: Left Arm, Patient Position: Sitting, Cuff Size: Normal)   Pulse (!) 53   Temp 99.1 F (37.3 C) (Oral)   Ht 5' 3"$  (1.6 m)    Wt 169 lb (76.7 kg)   SpO2 99%   BMI 29.94 kg/m                 Constitutional: Pt appears in NAD               HENT: Head: NCAT.                Right Ear: External ear normal.                 Left Ear: External ear normal.  Right canal irrigated of wax impaction               Eyes: . Pupils are equal, round, and reactive to light. Conjunctivae and EOM are normal               Nose: without d/c or deformity               Neck: Neck supple. Gross normal ROM               Cardiovascular: Normal rate and regular rhythm.                 Pulmonary/Chest: Effort normal and breath sounds without rales or wheezing.                Abd:  Soft, NT, ND, + BS, no organomegaly               Neurological: Pt is alert. At baseline orientation, motor grossly intact               Skin: Skin is warm. No rashes, no other new lesions, LE edema - none               Psychiatric: Pt behavior is normal without agitation   Micro: none  Cardiac tracings I have personally interpreted today:  none  Pertinent Radiological findings (summarize): none   Lab Results  Component Value Date   WBC 6.3 02/01/2023   HGB 13.0 02/01/2023   HCT 39.3 02/01/2023   PLT 210.0 02/01/2023   GLUCOSE 103 (H) 02/01/2023   CHOL 126 02/01/2023   TRIG 86.0 02/01/2023   HDL 50.30 02/01/2023   LDLDIRECT 137.0 12/11/2013   LDLCALC 59 02/01/2023   ALT 19 02/01/2023   AST 21 02/01/2023   NA 140 02/01/2023   K 4.3 02/01/2023   CL 107 02/01/2023   CREATININE 1.05 02/01/2023   BUN 16 02/01/2023   CO2 22 02/01/2023   TSH 2.36 02/01/2023   HGBA1C 6.0 02/01/2023   Assessment/Plan:  Annette [name] is a 67 y.o. Black or African American [2] female with  has a past medical history of ALLERGIC RHINITIS (10/01/2007), ANXIETY (11/10/2010), ATTENTION DEFICIT DISORDER (11/10/2010), BARTHOLIN'S CYST (08/20/2007), Carpal tunnel syndrome (08/20/2007), COLONIC POLYPS, HX OF (08/20/2007), DEPRESSION (08/20/2007), EMPHYSEMA (06/16/2010), HOT FLASHES  (09/26/2009), HYPERLIPIDEMIA (08/20/2007), HYPERTENSION (  08/20/2007), Insomnia, unspecified (10/01/2007), ISCHEMIC COLITIS, HX OF (10/01/2007), MENOPAUSAL DISORDER (11/07/2009), OBESITY, MILD (08/20/2007), OSTEOPENIA (06/16/2010), POLYARTHRALGIA (11/06/2008), and Raynaud's syndrome (08/20/2007).  Encounter for well adult exam with abnormal findings Age and sex appropriate education and counseling updated with regular exercise and diet Referrals for preventative services - none needed Immunizations addressed - declines tdap and flu shot Smoking counseling  - none needed Evidence for depression or other mood disorder - none significant Most recent labs reviewed. I have personally reviewed and have noted: 1) the patient's medical and social history 2) The patient's current medications and supplements 3) The patient's height, weight, and BMI have been recorded in the chart   Vitamin D deficiency Last vitamin D Lab Results  Component Value Date   VD25OH 28.14 (L) 02/01/2023   Low, to start oral replacement   Stage 3a chronic kidney disease ([facility]) Lab Results  Component Value Date   CREATININE 1.05 02/01/2023   Stable overall, cont to avoid nephrotoxins   Hyperlipidemia Lab Results  Component Value Date   LDLCALC 59 02/01/2023   Stable, pt to continue current statin crestor 20 mg qd   Hyperglycemia Lab Results  Component Value Date   HGBA1C 6.0 02/01/2023   Stable, pt to continue current medical treatment  - diet, wt control  Essential hypertension BP Readings from Last 3 Encounters:  02/03/23 122/78  10/13/22 102/76  08/25/22 108/66   Stable, pt to continue medical treatment  - diet, wt control   Lower abdominal pain Exam benign, afeb, but can't r/o uti - for urine culture and tx pending results  Cerumen impaction Ceruminosis is noted.  Wax is removed by syringing and manual debridement. Instructions for home care to prevent wax buildup are given.  Followup: Return in  about 1 year (around 02/04/2024).  Cathlean Cower, MD 02/05/2023 6:49 PM [facility]

## 2023-02-03 NOTE — Patient Instructions (Signed)
Your right ear was irrigated today  Please continue all other medications as before, and refills have been done if requested such as the crestor at 20 mg per day  Please take OTC Vitamin D3 at 2000 units per day, indefinitely  Please have the pharmacy call with any other refills you may need.  Please continue your efforts at being more active, low cholesterol diet, and weight control.  You are otherwise up to date with prevention measures today.  Please keep your appointments with your specialists as you may have planned  Please go to the LAB at the blood drawing area for the tests to be done - just the urine culture today  You will be contacted by phone if any changes need to be made immediately.  Otherwise, you will receive a letter about your results with an explanation, but please check with MyChart first.  Please remember to sign up for MyChart if you have not done so, as this will be important to you in the future with finding out test results, communicating by private email, and scheduling acute appointments online when needed.  Please make an Appointment to return for your 1 year visit, or sooner if needed, with Lab testing by Appointment as well, to be done about 3-5 days before at the [facility] (so this is for TWO appointments - please see the scheduling desk as you leave)

## 2023-02-04 ENCOUNTER — Telehealth: Payer: Self-pay

## 2023-02-04 LAB — URINALYSIS, ROUTINE W REFLEX MICROSCOPIC
Bilirubin Urine: NEGATIVE
Hgb urine dipstick: NEGATIVE
Ketones, ur: NEGATIVE
Nitrite: NEGATIVE
Specific Gravity, Urine: 1.01 (ref 1.000–1.030)
Total Protein, Urine: NEGATIVE
Urine Glucose: NEGATIVE
Urobilinogen, UA: 0.2 (ref 0.0–1.0)
pH: 6.5 (ref 5.0–8.0)

## 2023-02-04 NOTE — Telephone Encounter (Signed)
PA submitted   Key: [id number]

## 2023-02-05 ENCOUNTER — Encounter: Payer: Self-pay | Admitting: Internal Medicine

## 2023-02-05 ENCOUNTER — Other Ambulatory Visit: Payer: Self-pay | Admitting: Internal Medicine

## 2023-02-05 DIAGNOSIS — H6120 Impacted cerumen, unspecified ear: Secondary | ICD-10-CM | POA: Insufficient documentation

## 2023-02-05 LAB — URINE CULTURE
MICRO NUMBER:: 14538808
SPECIMEN QUALITY:: ADEQUATE

## 2023-02-05 MED ORDER — CIPROFLOXACIN HCL 500 MG PO TABS: 500.0000 mg | ORAL_TABLET | Freq: Two times a day (BID) | ORAL | 0 refills | Status: AC

## 2023-02-05 NOTE — Assessment & Plan Note (Signed)
Lab Results  Component Value Date   LDLCALC 59 02/01/2023   Stable, pt to continue current statin crestor 20 mg qd

## 2023-02-05 NOTE — Assessment & Plan Note (Signed)
Exam benign, afeb, but can't r/o uti - for urine culture and tx pending results

## 2023-02-05 NOTE — Assessment & Plan Note (Signed)
Age and sex appropriate education and counseling updated with regular exercise and diet Referrals for preventative services - none needed Immunizations addressed - declines tdap and flu shot Smoking counseling  - none needed Evidence for depression or other mood disorder - none significant Most recent labs reviewed. I have personally reviewed and have noted: 1) the patient's medical and social history 2) The patient's current medications and supplements 3) The patient's height, weight, and BMI have been recorded in the chart

## 2023-02-05 NOTE — Assessment & Plan Note (Signed)
Last vitamin D Lab Results  Component Value Date   VD25OH 28.14 (L) 02/01/2023   Low, to start oral replacement

## 2023-02-05 NOTE — Assessment & Plan Note (Signed)
Lab Results  Component Value Date   CREATININE 1.05 02/01/2023   Stable overall, cont to avoid nephrotoxins

## 2023-02-05 NOTE — Assessment & Plan Note (Signed)
BP Readings from Last 3 Encounters:  02/03/23 122/78  10/13/22 102/76  08/25/22 108/66   Stable, pt to continue medical treatment  - diet, wt control

## 2023-02-05 NOTE — Assessment & Plan Note (Signed)
Ceruminosis is noted.  Wax is removed by syringing and manual debridement. Instructions for home care to prevent wax buildup are given.  

## 2023-02-05 NOTE — Assessment & Plan Note (Signed)
Lab Results  Component Value Date   HGBA1C 6.0 02/01/2023   Stable, pt to continue current medical treatment  - diet, wt control

## 2023-02-08 NOTE — Telephone Encounter (Signed)
Pharmacy Patient Advocate Encounter  Received notification from [facility] that the request for prior authorization for Albuterol Sulfate has been denied due to .    Please be advised we currently do not have a Pharmacist to review denials, therefore you will need to process appeals accordingly as needed. Thanks for your support at this time.

## 2023-08-05 ENCOUNTER — Other Ambulatory Visit: Payer: Self-pay | Admitting: Internal Medicine

## 2023-08-05 MED ORDER — ZOLPIDEM TARTRATE 10 MG PO TABS: 10.0000 mg | ORAL_TABLET | Freq: Every evening | ORAL | 1 refills | Status: DC | PRN

## 2024-01-31 ENCOUNTER — Encounter: Payer: Self-pay | Admitting: Internal Medicine

## 2024-02-02 ENCOUNTER — Other Ambulatory Visit (INDEPENDENT_AMBULATORY_CARE_PROVIDER_SITE_OTHER): Payer: Medicare HMO

## 2024-02-02 DIAGNOSIS — R739 Hyperglycemia, unspecified: Secondary | ICD-10-CM | POA: Diagnosis not present

## 2024-02-02 DIAGNOSIS — E559 Vitamin D deficiency, unspecified: Secondary | ICD-10-CM | POA: Diagnosis not present

## 2024-02-02 DIAGNOSIS — E538 Deficiency of other specified B group vitamins: Secondary | ICD-10-CM | POA: Diagnosis not present

## 2024-02-02 DIAGNOSIS — E7800 Pure hypercholesterolemia, unspecified: Secondary | ICD-10-CM

## 2024-02-02 LAB — URINALYSIS, ROUTINE W REFLEX MICROSCOPIC
Bilirubin Urine: NEGATIVE
Hgb urine dipstick: NEGATIVE
Ketones, ur: NEGATIVE
Nitrite: NEGATIVE
RBC / HPF: NONE SEEN (ref 0–?)
Specific Gravity, Urine: 1.01 (ref 1.000–1.030)
Total Protein, Urine: NEGATIVE
Urine Glucose: NEGATIVE
Urobilinogen, UA: 0.2 (ref 0.0–1.0)
pH: 6 (ref 5.0–8.0)

## 2024-02-02 LAB — LIPID PANEL
Cholesterol: 142 mg/dL (ref 0–200)
HDL: 47 mg/dL (ref >39.00)
LDL Cholesterol: 75 mg/dL (ref 0–99)
NonHDL: 94.76
Total CHOL/HDL Ratio: 3
Triglycerides: 100 mg/dL (ref 0.0–149.0)
VLDL: 20 mg/dL (ref 0.0–40.0)

## 2024-02-02 LAB — BASIC METABOLIC PANEL
BUN: 21 mg/dL (ref 6–23)
CO2: 26 meq/L (ref 19–32)
Calcium: 9.4 mg/dL (ref 8.4–10.5)
Chloride: 104 meq/L (ref 96–112)
Creatinine, Ser: 1.2 mg/dL (ref 0.40–1.20)
GFR: 47.54 mL/min — ABNORMAL LOW (ref >60.00)
Glucose, Bld: 91 mg/dL (ref 70–99)
Potassium: 4.4 meq/L (ref 3.5–5.1)
Sodium: 137 meq/L (ref 135–145)

## 2024-02-02 LAB — CBC WITH DIFFERENTIAL/PLATELET
Basophils Absolute: 0 10*3/uL (ref 0.0–0.1)
Basophils Relative: 0.6 % (ref 0.0–3.0)
Eosinophils Absolute: 0.1 10*3/uL (ref 0.0–0.7)
Eosinophils Relative: 1.8 % (ref 0.0–5.0)
HCT: 41.7 % (ref 36.0–46.0)
Hemoglobin: 13.4 g/dL (ref 12.0–15.0)
Lymphocytes Relative: 48.7 % — ABNORMAL HIGH (ref 12.0–46.0)
Lymphs Abs: 3.2 10*3/uL (ref 0.7–4.0)
MCHC: 32.1 g/dL (ref 30.0–36.0)
MCV: 87.2 fL (ref 78.0–100.0)
Monocytes Absolute: 0.4 10*3/uL (ref 0.1–1.0)
Monocytes Relative: 6.1 % (ref 3.0–12.0)
Neutro Abs: 2.8 10*3/uL (ref 1.4–7.7)
Neutrophils Relative %: 42.8 % — ABNORMAL LOW (ref 43.0–77.0)
Platelets: 213 10*3/uL (ref 150.0–400.0)
RBC: 4.78 Mil/uL (ref 3.87–5.11)
RDW: 14.1 % (ref 11.5–15.5)
WBC: 6.5 10*3/uL (ref 4.0–10.5)

## 2024-02-02 LAB — VITAMIN D 25 HYDROXY (VIT D DEFICIENCY, FRACTURES): VITD: 27.07 ng/mL — ABNORMAL LOW (ref 30.00–100.00)

## 2024-02-02 LAB — HEPATIC FUNCTION PANEL
ALT: 19 U/L (ref 0–35)
AST: 21 U/L (ref 0–37)
Albumin: 4.1 g/dL (ref 3.5–5.2)
Alkaline Phosphatase: 61 U/L (ref 39–117)
Bilirubin, Direct: 0.1 mg/dL (ref 0.0–0.3)
Total Bilirubin: 0.4 mg/dL (ref 0.2–1.2)
Total Protein: 6.7 g/dL (ref 6.0–8.3)

## 2024-02-02 LAB — VITAMIN B12: Vitamin B-12: 688 pg/mL (ref 211–911)

## 2024-02-02 LAB — HEMOGLOBIN A1C: Hgb A1c MFr Bld: 6.1 % (ref 4.6–6.5)

## 2024-02-02 LAB — TSH: TSH: 2.62 u[IU]/mL (ref 0.35–5.50)

## 2024-02-07 ENCOUNTER — Ambulatory Visit (INDEPENDENT_AMBULATORY_CARE_PROVIDER_SITE_OTHER): Payer: 59 | Admitting: Internal Medicine

## 2024-02-07 ENCOUNTER — Encounter: Payer: Self-pay | Admitting: Internal Medicine

## 2024-02-07 VITALS — BP 124/72 | HR 59 | Temp 98.2°F | Ht 63.0 in | Wt 170.0 lb

## 2024-02-07 DIAGNOSIS — Z0001 Encounter for general adult medical examination with abnormal findings: Secondary | ICD-10-CM

## 2024-02-07 DIAGNOSIS — Z1211 Encounter for screening for malignant neoplasm of colon: Secondary | ICD-10-CM | POA: Diagnosis not present

## 2024-02-07 DIAGNOSIS — I7300 Raynaud's syndrome without gangrene: Secondary | ICD-10-CM

## 2024-02-07 DIAGNOSIS — R350 Frequency of micturition: Secondary | ICD-10-CM

## 2024-02-07 DIAGNOSIS — I10 Essential (primary) hypertension: Secondary | ICD-10-CM | POA: Diagnosis not present

## 2024-02-07 DIAGNOSIS — Z23 Encounter for immunization: Secondary | ICD-10-CM

## 2024-02-07 DIAGNOSIS — R9431 Abnormal electrocardiogram [ECG] [EKG]: Secondary | ICD-10-CM | POA: Diagnosis not present

## 2024-02-07 DIAGNOSIS — R739 Hyperglycemia, unspecified: Secondary | ICD-10-CM

## 2024-02-07 DIAGNOSIS — N1831 Chronic kidney disease, stage 3a: Secondary | ICD-10-CM | POA: Diagnosis not present

## 2024-02-07 DIAGNOSIS — E559 Vitamin D deficiency, unspecified: Secondary | ICD-10-CM

## 2024-02-07 DIAGNOSIS — E7800 Pure hypercholesterolemia, unspecified: Secondary | ICD-10-CM

## 2024-02-07 MED ORDER — AMLODIPINE BESYLATE 2.5 MG PO TABS: 2.5000 mg | ORAL_TABLET | Freq: Every day | ORAL | 3 refills | Status: DC

## 2024-02-07 MED ORDER — AMOXICILLIN 500 MG PO CAPS: 500.0000 mg | ORAL_CAPSULE | Freq: Three times a day (TID) | ORAL | 0 refills | Status: AC

## 2024-02-07 MED ORDER — ZOLPIDEM TARTRATE 10 MG PO TABS: 10.0000 mg | ORAL_TABLET | Freq: Every evening | ORAL | 1 refills | Status: AC | PRN

## 2024-02-07 MED ORDER — ALBUTEROL SULFATE HFA 108 (90 BASE) MCG/ACT IN AERS: 2.0000 | INHALATION_SPRAY | Freq: Four times a day (QID) | RESPIRATORY_TRACT | 3 refills | Status: DC | PRN

## 2024-02-07 MED ORDER — ROSUVASTATIN CALCIUM 20 MG PO TABS: 20.0000 mg | ORAL_TABLET | Freq: Every day | ORAL | 3 refills | Status: AC

## 2024-02-07 NOTE — Progress Notes (Unsigned)
Patient ID: Annette [name], female   DOB: [birth year], 68 y.o.   MRN: [id number]         Chief Complaint:: wellness exam and raynauds, urinary frequency, ckd3a, hld, low vit d       HPI:  Annette [name] is a 68 y.o. female here for wellness exam; due for colonoscopy, prevnar 20, for tdap at pharmacy, plans to call for GYN exam soon, ow up to date                        Also has worsening raynauds with cold weather, but wary of tachycardia with procardia.  Happens frequently enough in the colder weather to take a med.  Also has 3 days onset urinary freqnecy, o/w Denies urinary symptoms such as dysuria, urgency, flank pain, hematuria or n/v, fever, chills.  Has mildly worsening ckd3a, asks for renal referral.  Taking statin every other day but willing to take daily.     Wt Readings from Last 3 Encounters:  02/07/24 170 lb (77.1 kg)  02/03/23 169 lb (76.7 kg)  10/13/22 169 lb (76.7 kg)   BP Readings from Last 3 Encounters:  02/07/24 124/72  02/03/23 122/78  10/13/22 102/76   Immunization History  Administered Date(s) Administered   Fluad Quad(high Dose 65+) 11/11/2023   Influenza Inj Mdck Quad With Preservative 09/27/2019   Influenza Split 27-May-1956   Influenza Whole 10/08/2008   Influenza, Quadrivalent, Recombinant, Inj, Pf 10/05/2019   Influenza, Seasonal, Injecte, Preservative Fre 09/26/2013   Influenza,inj,Quad PF,6+ Mos 09/26/2021   Influenza-Unspecified 09/27/2015, 10/26/2015, 09/28/2016, 09/26/2018, 08/27/2020, 09/29/2020   PFIZER Comirnaty(Gray Top)Covid-19 Tri-Sucrose Vaccine 03/08/2020, 03/30/2020, 10/30/2020   PNEUMOCOCCAL CONJUGATE-20 02/07/2024   Pfizer Covid-19 Vaccine Bivalent Booster 58yrs & up 09/09/2021   Pfizer(Comirnaty)Fall Seasonal Vaccine 12 years and older 10/16/2022, 11/18/2023   Td 11/10/2010   Zoster Recombinant(Shingrix) 02/10/2018, 04/14/2018   Health Maintenance Due  Topic Date Due   Medicare Annual Wellness (AWV)  Never done   Cervical Cancer  Screening (HPV/Pap Cotest)  07/28/2015   DTaP/Tdap/Td (2 - Tdap) 11/10/2020      Past Medical History:  Diagnosis Date   ALLERGIC RHINITIS 10/01/2007   ANXIETY 11/10/2010   ATTENTION DEFICIT DISORDER 11/10/2010   BARTHOLIN'S CYST 08/20/2007   Carpal tunnel syndrome 08/20/2007   COLONIC POLYPS, HX OF 08/20/2007   DEPRESSION 08/20/2007   EMPHYSEMA 06/16/2010   HOT FLASHES 09/26/2009   HYPERLIPIDEMIA 08/20/2007   HYPERTENSION 08/20/2007   Insomnia, unspecified 10/01/2007   ISCHEMIC COLITIS, HX OF 10/01/2007   MENOPAUSAL DISORDER 11/07/2009   OBESITY, MILD 08/20/2007   OSTEOPENIA 06/16/2010   POLYARTHRALGIA 11/06/2008   Raynaud's syndrome 08/20/2007   Past Surgical History:  Procedure Laterality Date   TONSILLECTOMY      reports that she quit smoking about 33 years ago. Her smoking use included cigarettes. She has never used smokeless tobacco. She reports current alcohol use. She reports that she does not use drugs. family history includes ADD / ADHD in her daughter; Breast cancer in her maternal grandmother and another family member; Breast cancer (age of onset: 46) in her niece; Coronary artery disease in an other family member; Hypertension in an other family member; Osteoporosis in her maternal grandmother and mother; Pancreatic cancer in her father; Prostate cancer in an other family member. No Known Allergies Current Outpatient Medications on File Prior to Visit  Medication Sig Dispense Refill   aspirin EC 81 MG tablet Take 1 tablet (81 mg total)  by mouth daily. Swallow whole. 90 tablet 3   Estradiol (VAGIFEM) 10 MCG TABS vaginal tablet Place 1 tablet (10 mcg total) vaginally 2 (two) times a week. 24 tablet 3   fluticasone (FLONASE) 50 MCG/ACT nasal spray Place 2 sprays into both nostrils daily. 16 g 6   No current facility-administered medications on file prior to visit.        ROS:  All others reviewed and negative.  Objective        PE:  BP 124/72 (BP Location: Right Arm, Patient  Position: Sitting, Cuff Size: Normal)   Pulse (!) 59   Temp 98.2 F (36.8 C) (Oral)   Ht 5\' 3"  (1.6 m)   Wt 170 lb (77.1 kg)   SpO2 98%   BMI 30.11 kg/m                 Constitutional: Pt appears in NAD               HENT: Head: NCAT.                Right Ear: External ear normal.                 Left Ear: External ear normal.                Eyes: . Pupils are equal, round, and reactive to light. Conjunctivae and EOM are normal               Nose: without d/c or deformity               Neck: Neck supple. Gross normal ROM               Cardiovascular: Normal rate and regular rhythm.                 Pulmonary/Chest: Effort normal and breath sounds without rales or wheezing.                Abd:  Soft, NT, ND, + BS, no organomegaly               Neurological: Pt is alert. At baseline orientation, motor grossly intact               Skin: Skin is warm. No rashes, no other new lesions, LE edema - none               Psychiatric: Pt behavior is normal without agitation   Micro: none  Cardiac tracings I have personally interpreted today:  none  Pertinent Radiological findings (summarize): none   Lab Results  Component Value Date   WBC 6.5 02/02/2024   HGB 13.4 02/02/2024   HCT 41.7 02/02/2024   PLT 213.0 02/02/2024   GLUCOSE 91 02/02/2024   CHOL 142 02/02/2024   TRIG 100.0 02/02/2024   HDL 47.00 02/02/2024   LDLDIRECT 137.0 12/11/2013   LDLCALC 75 02/02/2024   ALT 19 02/02/2024   AST 21 02/02/2024   NA 137 02/02/2024   K 4.4 02/02/2024   CL 104 02/02/2024   CREATININE 1.20 02/02/2024   BUN 21 02/02/2024   CO2 26 02/02/2024   TSH 2.62 02/02/2024   HGBA1C 6.1 02/02/2024   Assessment/Plan:  Annette [name] is a 68 y.o. Black or African American [2] female with  has a past medical history of ALLERGIC RHINITIS (10/01/2007), ANXIETY (11/10/2010), ATTENTION DEFICIT DISORDER (11/10/2010), BARTHOLIN'S CYST (08/20/2007), Carpal tunnel syndrome (08/20/2007), COLONIC POLYPS, HX OF  (  08/20/2007), DEPRESSION (08/20/2007), EMPHYSEMA (06/16/2010), HOT FLASHES (09/26/2009), HYPERLIPIDEMIA (08/20/2007), HYPERTENSION (08/20/2007), Insomnia, unspecified (10/01/2007), ISCHEMIC COLITIS, HX OF (10/01/2007), MENOPAUSAL DISORDER (11/07/2009), OBESITY, MILD (08/20/2007), OSTEOPENIA (06/16/2010), POLYARTHRALGIA (11/06/2008), and Raynaud's syndrome (08/20/2007).  Encounter for well adult exam with abnormal findings Age and sex appropriate education and counseling updated with regular exercise and diet Referrals for preventative services - plans to call for GYN soon, for colonoscopy Immunizations addressed - for prevnar 20, for tdap at pharmacy Smoking counseling  - none needed Evidence for depression or other mood disorder - none significant Most recent labs reviewed. I have personally reviewed and have noted: 1) the patient's medical and social history 2) The patient's current medications and supplements 3) The patient's height, weight, and BMI have been recorded in the chart   Essential hypertension BP Readings from Last 3 Encounters:  02/07/24 124/72  02/03/23 122/78  10/13/22 102/76   Stable, pt to continue medical treatment  - diet, wt control   Hyperglycemia Lab Results  Component Value Date   HGBA1C 6.1 02/02/2024   Stable, pt to continue current medical treatment  - diet , wt control   Hyperlipidemia Lab Results  Component Value Date   LDLCALC 75 02/02/2024   Uncontrolled, goal ldl < 70, for increased crestor 20 mg to every day, also for Card CT score   Stage 3a chronic kidney disease ([facility]) Lab Results  Component Value Date   CREATININE 1.20 02/02/2024   With mild worsening overall, cont to avoid nephrotoxins refer nephrology, also for renal u/s   Vitamin D deficiency Last vitamin D Lab Results  Component Value Date   VD25OH 27.07 (L) 02/02/2024   Low, to start oral replacement   Raynaud's syndrome More symptomatic in the colder winter weather, does not  want procardia so can try amlodipine 2.5 mg qd  Urinary frequency Mild to mod, for urine cx, for antibx course amoxil 500 tid,,  to f/u any worsening symptoms or concerns  Followup: Return in about 1 year (around 02/06/2025).  Oliver Barre, MD 02/09/2024 8:40 PM [facility]

## 2024-02-07 NOTE — Patient Instructions (Addendum)
Please take all new medication as prescribed - the amlodipine for raynauds, the antibiotic  Please take OTC Vitamin D3 at 2000 units per day, indefinitely  Ok to increase the crestor 20 mg every day  You had the Prevnar 20 pneumonia shot today  Please continue all other medications as before, and refills have been done if requested.  Please have the pharmacy call with any other refills you may need.  Please continue your efforts at being more active, low cholesterol diet, and weight control.  You are otherwise up to date with prevention measures today.  Please keep your appointments with your specialists as you may have planned  You will be contacted regarding the referral for: Renal ultrasound, Nephrology, Cardiac CT score, colonoscopy (for dec 2025)  Plesae return to the lab in 3 months for Lipid check  Please make an Appointment to return for your 1 year visit, or sooner if needed, with Lab testing by Appointment as well, to be done about 3-5 days before at the FIRST FLOOR Lab (so this is for TWO appointments - please see the scheduling desk as you leave)

## 2024-02-09 ENCOUNTER — Encounter: Payer: Self-pay | Admitting: Internal Medicine

## 2024-02-09 NOTE — Assessment & Plan Note (Signed)
Last vitamin D Lab Results  Component Value Date   VD25OH 27.07 (L) 02/02/2024   Low, to start oral replacement

## 2024-02-09 NOTE — Assessment & Plan Note (Signed)
More symptomatic in the colder winter weather, does not want procardia so can try amlodipine 2.5 mg qd

## 2024-02-09 NOTE — Assessment & Plan Note (Signed)
Mild to mod, for urine cx, for antibx course amoxil 500 tid,,  to f/u any worsening symptoms or concerns

## 2024-02-09 NOTE — Assessment & Plan Note (Signed)
BP Readings from Last 3 Encounters:  02/07/24 124/72  02/03/23 122/78  10/13/22 102/76   Stable, pt to continue medical treatment  - diet, wt control

## 2024-02-09 NOTE — Assessment & Plan Note (Signed)
Age and sex appropriate education and counseling updated with regular exercise and diet Referrals for preventative services - plans to call for GYN soon, for colonoscopy Immunizations addressed - for prevnar 20, for tdap at pharmacy Smoking counseling  - none needed Evidence for depression or other mood disorder - none significant Most recent labs reviewed. I have personally reviewed and have noted: 1) the patient's medical and social history 2) The patient's current medications and supplements 3) The patient's height, weight, and BMI have been recorded in the chart

## 2024-02-09 NOTE — Assessment & Plan Note (Signed)
Lab Results  Component Value Date   HGBA1C 6.1 02/02/2024   Stable, pt to continue current medical treatment  - diet , wt control

## 2024-02-09 NOTE — Assessment & Plan Note (Signed)
Lab Results  Component Value Date   CREATININE 1.20 02/02/2024   With mild worsening overall, cont to avoid nephrotoxins refer nephrology, also for renal u/s

## 2024-02-09 NOTE — Assessment & Plan Note (Signed)
Lab Results  Component Value Date   LDLCALC 75 02/02/2024   Uncontrolled, goal ldl < 70, for increased crestor 20 mg to every day, also for Card CT score

## 2024-02-23 ENCOUNTER — Ambulatory Visit
Admission: EM | Admit: 2024-02-23 | Discharge: 2024-02-23 | Disposition: A | Discharge status: Home / Self Care | Payer: Medicare HMO | Attending: Family Medicine | Admitting: Family Medicine

## 2024-02-23 ENCOUNTER — Ambulatory Visit (INDEPENDENT_AMBULATORY_CARE_PROVIDER_SITE_OTHER): Payer: Medicare HMO

## 2024-02-23 DIAGNOSIS — B9789 Other viral agents as the cause of diseases classified elsewhere: Secondary | ICD-10-CM

## 2024-02-23 DIAGNOSIS — J988 Other specified respiratory disorders: Secondary | ICD-10-CM | POA: Diagnosis present

## 2024-02-23 DIAGNOSIS — Z20822 Contact with and (suspected) exposure to covid-19: Secondary | ICD-10-CM | POA: Diagnosis present

## 2024-02-23 LAB — POC COVID19/FLU A&B COMBO
Covid Antigen, POC: NEGATIVE
Influenza A Antigen, POC: NEGATIVE
Influenza B Antigen, POC: NEGATIVE

## 2024-02-23 MED ORDER — FLUTICASONE PROPIONATE 50 MCG/ACT NA SUSP: 2.0000 | Freq: Two times a day (BID) | NASAL | 0 refills | Status: AC | PRN

## 2024-02-23 MED ORDER — PROMETHAZINE-DM 6.25-15 MG/5ML PO SYRP: 5.0000 mL | ORAL_SOLUTION | Freq: Four times a day (QID) | ORAL | 0 refills | Status: DC | PRN

## 2024-02-23 NOTE — ED Provider Notes (Signed)
 [name]    CSN: [id number] Arrival date & time: 02/23/24  1209      History   Chief Complaint Chief Complaint  Patient presents with   Sore Throat   Generalized Body Aches    HPI Annette [name] is a 68 y.o. female.  Patient here today with sore throat, generalized bodyaches, fever, and cough.  Patient reports that she recently traveled and following a few days after her return she developed acute onset of the symptoms.  Reports she is also having some nasal and sinus congestion.  She has been taking NyQuil and Tylenol with minimal relief of fever and other symptoms.  She denies any shortness of breath or wheezing.  She has no history of asthma.   Past Medical History:  Diagnosis Date   ALLERGIC RHINITIS 10/01/2007   ANXIETY 11/10/2010   ATTENTION DEFICIT DISORDER 11/10/2010   BARTHOLIN'S CYST 08/20/2007   Carpal tunnel syndrome 08/20/2007   COLONIC POLYPS, HX OF 08/20/2007   DEPRESSION 08/20/2007   EMPHYSEMA 06/16/2010   HOT FLASHES 09/26/2009   HYPERLIPIDEMIA 08/20/2007   HYPERTENSION 08/20/2007   Insomnia, unspecified 10/01/2007   ISCHEMIC COLITIS, HX OF 10/01/2007   MENOPAUSAL DISORDER 11/07/2009   OBESITY, MILD 08/20/2007   OSTEOPENIA 06/16/2010   POLYARTHRALGIA 11/06/2008   Raynaud's syndrome 08/20/2007    Patient Active Problem List   Diagnosis Date Noted   Cerumen impaction 02/05/2023   Crampy pain 02/03/2023   History of adenomatous polyp of colon 02/03/2023   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   Vaginal discharge 02/03/2023   AC joint pain 08/25/2022   Tear of left supraspinatus tendon 07/14/2022   Genetic testing 04/23/2022   Acute bursitis of left shoulder 04/12/2022   Arthritis of left acromioclavicular joint 04/12/2022   Family history of pancreatic cancer 04/02/2022   Tibial collateral bursitis (Pellegrini-Stieda), left leg 03/10/2022   Vitamin D deficiency 02/02/2022   Palpitations 02/02/2022   Stage 3a chronic kidney disease (HCC) 02/02/2022    Family history of breast cancer 02/02/2022   Baker's cyst of knee, left 02/02/2022   Aortic atherosclerosis (HCC) 03/04/2021   Lower abdominal pain 02/01/2021   Pelvic pain 02/01/2021   Knee pain, bilateral 07/25/2019   Skin lesion 07/25/2019   Exposure to COVID-19 virus 07/25/2019   Urinary frequency 01/24/2019   Cough 11/08/2018   Nonallopathic lesion of rib cage 09/27/2018   Nonallopathic lesion of thoracic region 09/27/2018   Urinary retention 01/19/2017   Hamstring tendinitis of left thigh 01/19/2017   Nonallopathic lesion of cervical region 12/19/2015   Nonallopathic lesion of lumbosacral region 12/19/2015   Nonallopathic lesion of sacral region 12/19/2015   SI (sacroiliac) joint dysfunction 06/24/2015   Plantar fasciitis of right foot 01/21/2015   Hyperglycemia 01/08/2015   Pain in both feet 01/08/2015   Epigastric pain 06/11/2014   Rectal bleeding 06/11/2014   Iron deficiency anemia 11/20/2012   Acute pharyngitis 02/18/2012   Bacterial vaginal infection 11/12/2011   Encounter for well adult exam with abnormal findings 11/06/2011   ANXIETY 11/10/2010   ATTENTION DEFICIT DISORDER 11/10/2010   EMPHYSEMA 06/16/2010   BUNION, LEFT FOOT 06/16/2010   OSTEOPENIA 06/16/2010   MENOPAUSAL DISORDER 11/07/2009   UTI (urinary tract infection) 09/26/2009   HOT FLASHES 09/26/2009   POLYARTHRALGIA 11/06/2008   LUMBAR RADICULOPATHY, LEFT 11/06/2008   ALLERGIC RHINITIS 10/01/2007   Insomnia, unspecified 10/01/2007   ISCHEMIC COLITIS, HX OF 10/01/2007   Hyperlipidemia 08/20/2007   OBESITY, MILD 08/20/2007   Depression 08/20/2007   Carpal  tunnel syndrome 08/20/2007   Essential hypertension 08/20/2007   Raynaud's syndrome 08/20/2007   BARTHOLIN'S CYST 08/20/2007   History of colonic polyps 08/20/2007    Past Surgical History:  Procedure Laterality Date   TONSILLECTOMY      OB History   No obstetric history on file.      Home Medications    Prior to Admission  medications   Medication Sig Start Date End Date Taking? Authorizing Provider  albuterol (VENTOLIN HFA) 108 (90 Base) MCG/ACT inhaler Inhale 2 puffs into the lungs every 6 (six) hours as needed for wheezing or shortness of breath. 02/07/24  Yes Corwin Levins, MD  amLODipine (NORVASC) 2.5 MG tablet Take 1 tablet (2.5 mg total) by mouth daily. 02/07/24  Yes Corwin Levins, MD  aspirin EC 81 MG tablet Take 1 tablet (81 mg total) by mouth daily. Swallow whole. 02/05/22  Yes Orbie Pyo, MD  Estradiol (VAGIFEM) 10 MCG TABS vaginal tablet Place 1 tablet (10 mcg total) vaginally 2 (two) times a week. 02/04/22  Yes Corwin Levins, MD  fluticasone [facility]) 50 MCG/ACT nasal spray Place 2 sprays into both nostrils 2 (two) times daily as needed (Nasal congestion and drainage). 02/23/24  Yes Bing Neighbors, NP  promethazine-dextromethorphan (PROMETHAZINE-DM) 6.25-15 MG/5ML syrup Take 5 mLs by mouth 4 (four) times daily as needed for cough. 02/23/24  Yes Bing Neighbors, NP  rosuvastatin (CRESTOR) 20 MG tablet Take 1 tablet (20 mg total) by mouth daily. 02/07/24  Yes Corwin Levins, MD  zolpidem (AMBIEN) 10 MG tablet Take 1 tablet (10 mg total) by mouth at bedtime as needed. 02/07/24  Yes Corwin Levins, MD    Family History Family History  Problem Relation Age of Onset   Osteoporosis Mother    Pancreatic cancer Father        dx. 36s   Osteoporosis Maternal Grandmother    Breast cancer Maternal Grandmother        dx. 67s   ADD / ADHD Daughter    Coronary artery disease Other    Hypertension Other    Prostate cancer Other    Breast cancer Other        dx. 30s   Breast cancer Niece 59   Colon cancer Neg Hx     Social History Social History   Tobacco Use   Smoking status: Former    Current packs/day: 0.00    Types: Cigarettes    Quit date: 12/27/1990    Years since quitting: 33.1   Smokeless tobacco: Never  Substance Use Topics   Alcohol use: Yes    Comment: occ   Drug use: No      Allergies   Patient has no known allergies.   Review of Systems Review of Systems Pertinent negatives listed in HPI   Physical Exam Triage Vital Signs ED Triage Vitals  Encounter Vitals Group     BP 02/23/24 1251 111/70     Systolic BP Percentile --      Diastolic BP Percentile --      Pulse Rate 02/23/24 1251 76     Resp 02/23/24 1251 18     Temp 02/23/24 1251 (!) 100.9 F (38.3 C)     Temp Source 02/23/24 1251 Oral     SpO2 02/23/24 1251 95 %     Weight --      Height --      Head Circumference --      Peak Flow --  Pain Score 02/23/24 1245 6     Pain Loc --      Pain Education --      Exclude from Growth Chart --    No data found.  Updated Vital Signs BP 111/70 (BP Location: Right Arm)   Pulse 76   Temp (!) 100.9 F (38.3 C) (Oral)   Resp 18   SpO2 95%   Visual Acuity Right Eye Distance:   Left Eye Distance:   Bilateral Distance:    Right Eye Near:   Left Eye Near:    Bilateral Near:     Physical Exam Vitals reviewed.  Constitutional:      Appearance: She is well-developed. She is ill-appearing.  HENT:     Head: Normocephalic and atraumatic.     Nose: Rhinorrhea present. No congestion.     Mouth/Throat:     Lips: Pink.     Pharynx: Uvula midline. Posterior oropharyngeal erythema and postnasal drip present. No oropharyngeal exudate.  Eyes:     Conjunctiva/sclera: Conjunctivae normal.     Pupils: Pupils are equal, round, and reactive to light.  Cardiovascular:     Rate and Rhythm: Normal rate and regular rhythm.  Pulmonary:     Effort: Pulmonary effort is normal.     Breath sounds: Normal breath sounds.  Musculoskeletal:     Cervical back: Normal range of motion and neck supple.  Skin:    General: Skin is warm.  Neurological:     General: No focal deficit present.     Mental Status: She is alert and oriented to person, place, and time.      UC Treatments / Results  Labs (all labs ordered are listed, but only abnormal results  are displayed) Labs Reviewed  POC COVID19/FLU A&B COMBO - Normal  SARS CORONAVIRUS 2 (TAT 6-24 HRS)    EKG   Radiology DG Chest 2 View Result Date: 02/23/2024 CLINICAL DATA:  Fever cough and chills EXAM: CHEST - 2 VIEW COMPARISON:  Chest radiograph 11/08/2018 FINDINGS: The heart size and mediastinal contours are within normal limits. Both lungs are clear. The visualized skeletal structures are unremarkable. IMPRESSION: No active cardiopulmonary disease. Electronically Signed   By: Annia Belt M.D.   On: 02/23/2024 16:26    Procedures Procedures (including critical care time)  Medications Ordered in UC Medications - No data to display  Initial Impression / Assessment and Plan / UC Course  I have reviewed the triage vital signs and the nursing notes.  Pertinent labs & imaging results that were available during my care of the patient were reviewed by me and considered in my medical decision making (see chart for details).    Rapid strep and COVID both negative here in clinic.  ER COVID test ordered will result within 24 hours.  Chest x-ray unremarkable.  Still awaiting radiology final read.  Encouraged patient to continue to manage fever with Tylenol and or ibuprofen.  Encouraged to hydrate well with fluids.  Prescribed symptom management to include Promethazine DM for cough and congestion and Flonase for nasal symptoms.  Strict return precautions given if symptoms worsen or do not improve. Final Clinical Impressions(s) / UC Diagnoses   Final diagnoses:  Encounter for laboratory testing for COVID-19 virus  Viral respiratory illness     Discharge Instructions      Flu and COVID test here in clinic were negative.  I am concerned for possible COVID, ordered a PCR COVID test which is more sensitive than the test we  have here in clinic.  These results should be available sometimes tomorrow evening.  As with any virus symptom management is indicated.  Can you to alternate Tylenol and  ibuprofen for fever management.     ED Prescriptions     Medication Sig Dispense Auth. Provider   promethazine-dextromethorphan (PROMETHAZINE-DM) 6.25-15 MG/5ML syrup Take 5 mLs by mouth 4 (four) times daily as needed for cough. 180 mL Bing Neighbors, NP   fluticasone (FLONASE) 50 MCG/ACT nasal spray Place 2 sprays into both nostrils 2 (two) times daily as needed (Nasal congestion and drainage). 9.9 mL Bing Neighbors, NP      PDMP not reviewed this encounter.   Bing Neighbors, NP 02/24/24 1250

## 2024-02-23 NOTE — Discharge Instructions (Addendum)
 Flu and COVID test here in clinic were negative.  I am concerned for possible COVID, ordered a PCR COVID test which is more sensitive than the test we have here in clinic.  These results should be available sometimes tomorrow evening.  As with any virus symptom management is indicated.  Can you to alternate Tylenol and ibuprofen for fever management.

## 2024-02-23 NOTE — ED Triage Notes (Signed)
 Pt reports HA, body aches, chills, congestion, sinus pressure x 2 days.  Chest aches with deep breaths.  Tylenol and Nyquil not helping either. Last dose of Tylenol 0900.

## 2024-02-24 ENCOUNTER — Encounter: Payer: Self-pay | Admitting: Family Medicine

## 2024-02-24 LAB — SARS CORONAVIRUS 2 (TAT 6-24 HRS): SARS Coronavirus 2: NEGATIVE

## 2024-02-28 ENCOUNTER — Other Ambulatory Visit: Payer: Medicare HMO

## 2024-03-02 ENCOUNTER — Other Ambulatory Visit: Payer: Medicare HMO

## 2024-03-11 ENCOUNTER — Other Ambulatory Visit: Payer: Self-pay

## 2024-03-11 ENCOUNTER — Ambulatory Visit (INDEPENDENT_AMBULATORY_CARE_PROVIDER_SITE_OTHER)

## 2024-03-11 ENCOUNTER — Encounter: Payer: Self-pay | Admitting: Emergency Medicine

## 2024-03-11 ENCOUNTER — Ambulatory Visit
Admission: EM | Admit: 2024-03-11 | Discharge: 2024-03-11 | Disposition: A | Discharge status: Home / Self Care | Attending: Internal Medicine | Admitting: Internal Medicine

## 2024-03-11 DIAGNOSIS — R052 Subacute cough: Secondary | ICD-10-CM | POA: Diagnosis not present

## 2024-03-11 DIAGNOSIS — R058 Other specified cough: Secondary | ICD-10-CM | POA: Diagnosis not present

## 2024-03-11 DIAGNOSIS — J208 Acute bronchitis due to other specified organisms: Secondary | ICD-10-CM

## 2024-03-11 MED ORDER — BENZONATATE 100 MG PO CAPS: 100.0000 mg | ORAL_CAPSULE | Freq: Three times a day (TID) | ORAL | 0 refills | Status: DC

## 2024-03-11 MED ORDER — PREDNISONE 20 MG PO TABS: 40.0000 mg | ORAL_TABLET | Freq: Every day | ORAL | 0 refills | Status: AC

## 2024-03-11 NOTE — Discharge Instructions (Addendum)
 Chest x-ray done today and final evaluation by the radiologist shows no evidence of acute changes to the chest.  His symptoms likely secondary to mild bronchitis (which is inflammation in the airways) versus a postviral cough.  We will treat this with the following: Prednisone 40 mg (2 tablets) once daily for 5 days. Take this in the morning.  This is a steroid to help with inflammation and pain. Benzonatate (tessalon) 100 mg every 8 hours as needed for cough.   Can try using a humidifier at home  Rest and stay hydrated.   Return to urgent care or PCP if symptoms worsen or fail to resolve.

## 2024-03-11 NOTE — ED Triage Notes (Signed)
 Pt here for cough x 3 weeks with some body aches that has not improved

## 2024-03-11 NOTE — ED Provider Notes (Signed)
 [name]    CSN: [id number] Arrival date & time: 03/11/24  1301      History   Chief Complaint Chief Complaint  Patient presents with   Cough    HPI Annette [name] is a 68 y.o. female.   68 year old female who presents urgent care with complaints of cough, fatigue, generalized weakness, body aches and chest congestion.  This has been going on for about 3 weeks now.  She was seen on 2/27 and at that time had a negative chest x-ray and negative flu and COVID testing.  She reports that she has really not felt any better since then.  She is not running fevers.  She is using the medication that was given to her on the 27th.  She has very minimal appetite so her p.o. intake has not been great.  She denies frank shortness of breath but does feel like it is difficult to cough up stuff that is in her chest.   Cough Associated symptoms: no chest pain, no chills, no ear pain, no fever, no rash, no shortness of breath and no sore throat     Past Medical History:  Diagnosis Date   ALLERGIC RHINITIS 10/01/2007   ANXIETY 11/10/2010   ATTENTION DEFICIT DISORDER 11/10/2010   BARTHOLIN'S CYST 08/20/2007   Carpal tunnel syndrome 08/20/2007   COLONIC POLYPS, HX OF 08/20/2007   DEPRESSION 08/20/2007   EMPHYSEMA 06/16/2010   HOT FLASHES 09/26/2009   HYPERLIPIDEMIA 08/20/2007   HYPERTENSION 08/20/2007   Insomnia, unspecified 10/01/2007   ISCHEMIC COLITIS, HX OF 10/01/2007   MENOPAUSAL DISORDER 11/07/2009   OBESITY, MILD 08/20/2007   OSTEOPENIA 06/16/2010   POLYARTHRALGIA 11/06/2008   Raynaud's syndrome 08/20/2007    Patient Active Problem List   Diagnosis Date Noted   Cerumen impaction 02/05/2023   Crampy pain 02/03/2023   History of adenomatous polyp of colon 02/03/2023   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   Vaginal discharge 02/03/2023   AC joint pain 08/25/2022   Tear of left supraspinatus tendon 07/14/2022   Genetic testing 04/23/2022   Acute bursitis of left shoulder 04/12/2022    Arthritis of left acromioclavicular joint 04/12/2022   Family history of pancreatic cancer 04/02/2022   Tibial collateral bursitis (Pellegrini-Stieda), left leg 03/10/2022   Vitamin D deficiency 02/02/2022   Palpitations 02/02/2022   Stage 3a chronic kidney disease (HCC) 02/02/2022   Family history of breast cancer 02/02/2022   Baker's cyst of knee, left 02/02/2022   Aortic atherosclerosis (HCC) 03/04/2021   Lower abdominal pain 02/01/2021   Pelvic pain 02/01/2021   Knee pain, bilateral 07/25/2019   Skin lesion 07/25/2019   Exposure to COVID-19 virus 07/25/2019   Urinary frequency 01/24/2019   Cough 11/08/2018   Nonallopathic lesion of rib cage 09/27/2018   Nonallopathic lesion of thoracic region 09/27/2018   Urinary retention 01/19/2017   Hamstring tendinitis of left thigh 01/19/2017   Nonallopathic lesion of cervical region 12/19/2015   Nonallopathic lesion of lumbosacral region 12/19/2015   Nonallopathic lesion of sacral region 12/19/2015   SI (sacroiliac) joint dysfunction 06/24/2015   Plantar fasciitis of right foot 01/21/2015   Hyperglycemia 01/08/2015   Pain in both feet 01/08/2015   Epigastric pain 06/11/2014   Rectal bleeding 06/11/2014   Iron deficiency anemia 11/20/2012   Acute pharyngitis 02/18/2012   Bacterial vaginal infection 11/12/2011   Encounter for well adult exam with abnormal findings 11/06/2011   ANXIETY 11/10/2010   ATTENTION DEFICIT DISORDER 11/10/2010   EMPHYSEMA 06/16/2010   BUNION, LEFT  FOOT 06/16/2010   OSTEOPENIA 06/16/2010   MENOPAUSAL DISORDER 11/07/2009   UTI (urinary tract infection) 09/26/2009   HOT FLASHES 09/26/2009   POLYARTHRALGIA 11/06/2008   LUMBAR RADICULOPATHY, LEFT 11/06/2008   ALLERGIC RHINITIS 10/01/2007   Insomnia, unspecified 10/01/2007   ISCHEMIC COLITIS, HX OF 10/01/2007   Hyperlipidemia 08/20/2007   OBESITY, MILD 08/20/2007   Depression 08/20/2007   Carpal tunnel syndrome 08/20/2007   Essential hypertension  08/20/2007   Raynaud's syndrome 08/20/2007   BARTHOLIN'S CYST 08/20/2007   History of colonic polyps 08/20/2007    Past Surgical History:  Procedure Laterality Date   TONSILLECTOMY      OB History   No obstetric history on file.      Home Medications    Prior to Admission medications   Medication Sig Start Date End Date Taking? Authorizing Provider  albuterol (VENTOLIN HFA) 108 (90 Base) MCG/ACT inhaler Inhale 2 puffs into the lungs every 6 (six) hours as needed for wheezing or shortness of breath. 02/07/24   Corwin Levins, MD  amLODipine (NORVASC) 2.5 MG tablet Take 1 tablet (2.5 mg total) by mouth daily. 02/07/24   Corwin Levins, MD  aspirin EC 81 MG tablet Take 1 tablet (81 mg total) by mouth daily. Swallow whole. 02/05/22   Orbie Pyo, MD  Estradiol (VAGIFEM) 10 MCG TABS vaginal tablet Place 1 tablet (10 mcg total) vaginally 2 (two) times a week. 02/04/22   Corwin Levins, MD  fluticasone (FLONASE) 50 MCG/ACT nasal spray Place 2 sprays into both nostrils 2 (two) times daily as needed (Nasal congestion and drainage). 02/23/24   Bing Neighbors, NP  promethazine-dextromethorphan (PROMETHAZINE-DM) 6.25-15 MG/5ML syrup Take 5 mLs by mouth 4 (four) times daily as needed for cough. 02/23/24   Bing Neighbors, NP  rosuvastatin (CRESTOR) 20 MG tablet Take 1 tablet (20 mg total) by mouth daily. 02/07/24   Corwin Levins, MD  zolpidem (AMBIEN) 10 MG tablet Take 1 tablet (10 mg total) by mouth at bedtime as needed. 02/07/24   Corwin Levins, MD    Family History Family History  Problem Relation Age of Onset   Osteoporosis Mother    Pancreatic cancer Father        dx. 81s   Osteoporosis Maternal Grandmother    Breast cancer Maternal Grandmother        dx. 70s   ADD / ADHD Daughter    Coronary artery disease Other    Hypertension Other    Prostate cancer Other    Breast cancer Other        dx. 32s   Breast cancer Niece 64   Colon cancer Neg Hx     Social History Social  History   Tobacco Use   Smoking status: Former    Current packs/day: 0.00    Types: Cigarettes    Quit date: 12/27/1990    Years since quitting: 33.2   Smokeless tobacco: Never  Substance Use Topics   Alcohol use: Yes    Comment: occ   Drug use: No     Allergies   Patient has no known allergies.   Review of Systems Review of Systems  Constitutional:  Positive for activity change, appetite change and fatigue. Negative for chills and fever.  HENT:  Positive for congestion. Negative for ear pain and sore throat.   Eyes:  Negative for pain and visual disturbance.  Respiratory:  Positive for cough and chest tightness. Negative for shortness of breath.   Cardiovascular:  Negative for chest pain and palpitations.  Gastrointestinal:  Negative for abdominal pain and vomiting.  Genitourinary:  Negative for dysuria and hematuria.  Musculoskeletal:  Negative for arthralgias and back pain.  Skin:  Negative for color change and rash.  Neurological:  Negative for seizures and syncope.  All other systems reviewed and are negative.    Physical Exam Triage Vital Signs ED Triage Vitals [03/11/24 1344]  Encounter Vitals Group     BP 117/65     Systolic BP Percentile      Diastolic BP Percentile      Pulse Rate 62     Resp 18     Temp 98 F (36.7 C)     Temp Source Oral     SpO2 97 %     Weight      Height      Head Circumference      Peak Flow      Pain Score 3     Pain Loc      Pain Education      Exclude from Growth Chart    No data found.  Updated Vital Signs BP 117/65 (BP Location: Left Arm)   Pulse 62   Temp 98 F (36.7 C) (Oral)   Resp 18   SpO2 97%   Visual Acuity Right Eye Distance:   Left Eye Distance:   Bilateral Distance:    Right Eye Near:   Left Eye Near:    Bilateral Near:     Physical Exam Vitals and nursing note reviewed.  Constitutional:      General: She is not in acute distress.    Appearance: She is well-developed.  HENT:     Head:  Normocephalic and atraumatic.  Eyes:     Conjunctiva/sclera: Conjunctivae normal.  Cardiovascular:     Rate and Rhythm: Normal rate and regular rhythm.     Heart sounds: No murmur heard. Pulmonary:     Effort: Pulmonary effort is normal. No respiratory distress.     Breath sounds: Examination of the right-middle field reveals rhonchi. Examination of the left-middle field reveals rhonchi. Rhonchi present.  Abdominal:     Palpations: Abdomen is soft.     Tenderness: There is no abdominal tenderness.  Musculoskeletal:        General: No swelling.     Cervical back: Neck supple.  Skin:    General: Skin is warm and dry.     Capillary Refill: Capillary refill takes less than 2 seconds.  Neurological:     Mental Status: She is alert.  Psychiatric:        Mood and Affect: Mood normal.      UC Treatments / Results  Labs (all labs ordered are listed, but only abnormal results are displayed) Labs Reviewed - No data to display  EKG   Radiology No results found.  Procedures Procedures (including critical care time)  Medications Ordered in UC Medications - No data to display  Initial Impression / Assessment and Plan / UC Course  I have reviewed the triage vital signs and the nursing notes.  Pertinent labs & imaging results that were available during my care of the patient were reviewed by me and considered in my medical decision making (see chart for details).     Subacute cough - Plan: DG Chest 2 View, DG Chest 2 View  Acute bronchitis due to other specified organisms  Post-viral cough syndrome   Chest x-ray done today and final evaluation by the  radiologist shows no evidence of acute changes to the chest.  His symptoms likely secondary to mild bronchitis (which is inflammation in the airways) versus a postviral cough.  We will treat this with the following: Prednisone 40 mg (2 tablets) once daily for 5 days. Take this in the morning.  This is a steroid to help with  inflammation and pain. Benzonatate (tessalon) 100 mg every 8 hours as needed for cough.  Can try using a humidifier at home  Rest and stay hydrated.   Return to urgent care or PCP if symptoms worsen or fail to resolve.    Final Clinical Impressions(s) / UC Diagnoses   Final diagnoses:  None   Discharge Instructions   None    ED Prescriptions   None    PDMP not reviewed this encounter.   Landis Martins, [place] 03/11/24 1439

## 2024-03-12 ENCOUNTER — Ambulatory Visit: Payer: Self-pay

## 2024-03-16 ENCOUNTER — Ambulatory Visit
Admission: RE | Admit: 2024-03-16 | Discharge: 2024-03-16 | Disposition: A | Discharge status: Home / Self Care | Payer: Medicare HMO | Source: Ambulatory Visit | Attending: Internal Medicine | Admitting: Internal Medicine

## 2024-03-16 ENCOUNTER — Encounter: Payer: Self-pay | Admitting: Internal Medicine

## 2024-03-16 ENCOUNTER — Ambulatory Visit (HOSPITAL_COMMUNITY)
Admission: RE | Admit: 2024-03-16 | Discharge: 2024-03-16 | Disposition: A | Discharge status: Home / Self Care | Payer: Self-pay | Source: Ambulatory Visit | Attending: Internal Medicine | Admitting: Internal Medicine

## 2024-03-16 DIAGNOSIS — N1831 Chronic kidney disease, stage 3a: Secondary | ICD-10-CM

## 2024-03-16 DIAGNOSIS — I10 Essential (primary) hypertension: Secondary | ICD-10-CM | POA: Insufficient documentation

## 2024-03-16 DIAGNOSIS — E7800 Pure hypercholesterolemia, unspecified: Secondary | ICD-10-CM | POA: Insufficient documentation

## 2024-03-16 DIAGNOSIS — R739 Hyperglycemia, unspecified: Secondary | ICD-10-CM | POA: Insufficient documentation

## 2024-03-16 DIAGNOSIS — R9431 Abnormal electrocardiogram [ECG] [EKG]: Secondary | ICD-10-CM | POA: Insufficient documentation

## 2024-03-19 ENCOUNTER — Encounter: Payer: Self-pay | Admitting: Internal Medicine

## 2024-03-20 ENCOUNTER — Encounter: Payer: Self-pay | Admitting: Internal Medicine

## 2024-03-20 DIAGNOSIS — N1831 Chronic kidney disease, stage 3a: Secondary | ICD-10-CM

## 2024-04-03 ENCOUNTER — Encounter: Payer: Self-pay | Admitting: Genetic Counselor

## 2024-07-17 ENCOUNTER — Ambulatory Visit: Payer: Self-pay

## 2024-07-17 NOTE — Telephone Encounter (Signed)
 APPOINTMENT MADE FOR 07/18/2024 AT 3:20 PM WITH DR LYNWOOD RUSH    FYI Only or Action Required?: FYI only for provider.  Patient was last seen in primary care on 02/07/2024 by RUSH LYNWOOD ORN, MD.  Called Nurse Triage reporting Muscle Pain.  Symptoms began several months ago.  Interventions attempted: Other: patient states that she takes her statin medication every other day for the past few weeks and she also takes over the  counter medication for pain as needed.  Symptoms are: gradually worsening.  Triage Disposition: See PCP When Office is Open (Within 3 Days)  Patient/caregiver understands and will follow disposition?: Yes                        Copied from CRM [id number]. Topic: Clinical - Red Word Triage >> Jul 17, 2024 11:06 AM Robinson H wrote: [facility] that prompted transfer to Nurse Triage: Persistent hip pain and body aches Reason for Disposition  [1] MILD or MODERATE muscle aches or pain AND [2] taking a statin medicine (a lipid or cholesterol lowering drug)  Answer Assessment - Initial Assessment Questions Patient states that she has been taking statin medications for years and the 20mg  dose for probably about 2 years now She states that she takes it every other day for the past few weeks and she hasn't taken it for a few days. She states that she is going to take it today She isnt sure if her muscle aches are related to her statin medication, exercise, or arthritis but she wanted to get it checked out The pain is persistent but varies---sometimes both legs, sometimes hips, sometimes lower back General aches for several months now with no known injuries She also states that she has had a slight cough and congestion for several months that she thinks could possibly be from long covid    1. ONSET: When did the muscle aches or body pains start?      Several months ago 2. LOCATION: What part of your body is hurting? (e.g., entire body, arms, legs)       General but lower back and both hips 3. SEVERITY: How bad is the pain? (Scale 1-10; or mild, moderate, severe)     persistent 4. CAUSE: What do you think is causing the pains?     Possibly arthritis or maybe statin medications 5. FEVER: Do you have a fever? If Yes, ask: What is your temperature, how was it measured, and  when did it start?      No 6. OTHER SYMPTOMS: Do you have any other symptoms? (e.g., chest pain, cold or flu symptoms, rash, weakness, weight loss)     Tired, cough persistent--patient thinking long covid  Protocols used: Muscle Aches and Body Pain-A-AH

## 2024-07-18 ENCOUNTER — Ambulatory Visit: Payer: Self-pay | Admitting: Internal Medicine

## 2024-07-18 ENCOUNTER — Ambulatory Visit (INDEPENDENT_AMBULATORY_CARE_PROVIDER_SITE_OTHER): Admitting: Internal Medicine

## 2024-07-18 VITALS — BP 108/62 | HR 68 | Temp 97.8°F | Ht 63.0 in | Wt 173.2 lb

## 2024-07-18 DIAGNOSIS — M5442 Lumbago with sciatica, left side: Secondary | ICD-10-CM

## 2024-07-18 DIAGNOSIS — M5441 Lumbago with sciatica, right side: Secondary | ICD-10-CM | POA: Diagnosis not present

## 2024-07-18 DIAGNOSIS — M79671 Pain in right foot: Secondary | ICD-10-CM | POA: Diagnosis not present

## 2024-07-18 DIAGNOSIS — Z1211 Encounter for screening for malignant neoplasm of colon: Secondary | ICD-10-CM | POA: Diagnosis not present

## 2024-07-18 DIAGNOSIS — I10 Essential (primary) hypertension: Secondary | ICD-10-CM | POA: Diagnosis not present

## 2024-07-18 DIAGNOSIS — G8929 Other chronic pain: Secondary | ICD-10-CM | POA: Insufficient documentation

## 2024-07-18 DIAGNOSIS — M791 Myalgia, unspecified site: Secondary | ICD-10-CM | POA: Diagnosis not present

## 2024-07-18 LAB — CBC WITH DIFFERENTIAL/PLATELET
Basophils Absolute: 0 K/uL (ref 0.0–0.1)
Basophils Relative: 0.5 % (ref 0.0–3.0)
Eosinophils Absolute: 0.1 K/uL (ref 0.0–0.7)
Eosinophils Relative: 1.4 % (ref 0.0–5.0)
HCT: 39.6 % (ref 36.0–46.0)
Hemoglobin: 12.8 g/dL (ref 12.0–15.0)
Lymphocytes Relative: 43.2 % (ref 12.0–46.0)
Lymphs Abs: 2.9 K/uL (ref 0.7–4.0)
MCHC: 32.3 g/dL (ref 30.0–36.0)
MCV: 85.2 fl (ref 78.0–100.0)
Monocytes Absolute: 0.5 K/uL (ref 0.1–1.0)
Monocytes Relative: 7 % (ref 3.0–12.0)
Neutro Abs: 3.2 K/uL (ref 1.4–7.7)
Neutrophils Relative %: 47.9 % (ref 43.0–77.0)
Platelets: 199 K/uL (ref 150.0–400.0)
RBC: 4.65 Mil/uL (ref 3.87–5.11)
RDW: 14 % (ref 11.5–15.5)
WBC: 6.7 K/uL (ref 4.0–10.5)

## 2024-07-18 LAB — SEDIMENTATION RATE: Sed Rate: 8 mm/h (ref 0–30)

## 2024-07-18 LAB — HEPATIC FUNCTION PANEL
ALT: 17 U/L (ref 0–35)
AST: 24 U/L (ref 0–37)
Albumin: 4.3 g/dL (ref 3.5–5.2)
Alkaline Phosphatase: 60 U/L (ref 39–117)
Bilirubin, Direct: 0.1 mg/dL (ref 0.0–0.3)
Total Bilirubin: 0.4 mg/dL (ref 0.2–1.2)
Total Protein: 7 g/dL (ref 6.0–8.3)

## 2024-07-18 LAB — CK: Total CK: 161 U/L (ref 17–177)

## 2024-07-18 LAB — BASIC METABOLIC PANEL WITH GFR
BUN: 21 mg/dL (ref 6–23)
CO2: 26 meq/L (ref 19–32)
Calcium: 9.5 mg/dL (ref 8.4–10.5)
Chloride: 107 meq/L (ref 96–112)
Creatinine, Ser: 1.05 mg/dL (ref 0.40–1.20)
GFR: 55.63 mL/min — ABNORMAL LOW (ref >60.00)
Glucose, Bld: 83 mg/dL (ref 70–99)
Potassium: 3.9 meq/L (ref 3.5–5.1)
Sodium: 139 meq/L (ref 135–145)

## 2024-07-18 MED ORDER — ALBUTEROL SULFATE HFA 108 (90 BASE) MCG/ACT IN AERS: 2.0000 | INHALATION_SPRAY | Freq: Four times a day (QID) | RESPIRATORY_TRACT | 3 refills | Status: AC | PRN

## 2024-07-18 MED ORDER — TRAMADOL HCL 50 MG PO TABS: 50.0000 mg | ORAL_TABLET | Freq: Four times a day (QID) | ORAL | 0 refills | Status: DC | PRN

## 2024-07-18 MED ORDER — PREDNISONE 10 MG PO TABS: ORAL_TABLET | ORAL | 0 refills | Status: DC

## 2024-07-18 MED ORDER — GABAPENTIN 300 MG PO CAPS: 300.0000 mg | ORAL_CAPSULE | Freq: Three times a day (TID) | ORAL | 5 refills | Status: AC | PRN

## 2024-07-18 NOTE — Assessment & Plan Note (Signed)
 BP Readings from Last 3 Encounters:  07/18/24 108/62  03/11/24 117/65  02/23/24 111/70   Stable, pt to continue medical treatment norvasc  2.5 mg qd

## 2024-07-18 NOTE — Assessment & Plan Note (Signed)
 Also with more diffuse myalgias suggestive of FMS but will check sed rate, CPK

## 2024-07-18 NOTE — Assessment & Plan Note (Signed)
 Mild to mod, not likely assoc with other issues today,  for podiatry referral, to f/u any worsening symptoms or concerns

## 2024-07-18 NOTE — Patient Instructions (Addendum)
 Ok to HOLD the statin for 2 weeks to see if all the pain is resolved, but if not, please restart the statin  Please take all new medication as prescribed - the tramadol  as needed for pain, prednisone  course, and gabapentin  as discussed (and call later if you need the tramadol  refilled)  Please continue all other medications as before, and refills have been done if requested - inhaler  Please have the pharmacy call with any other refills you may need.  Please keep your appointments with your specialists as you may have planned  You will be contacted regarding the referral for: podiatry, and orthopedic, and MRI Lumbar spine  Please go to the LAB at the blood drawing area for the tests to be done for the muscle test among others  You will be contacted by phone if any changes need to be made immediately.  Otherwise, you will receive a letter about your results with an explanation, but please check with MyChart first.  Please make an Appointment to return in 6 months, or sooner if needed

## 2024-07-18 NOTE — Progress Notes (Signed)
 The test results show that your current treatment is OK, as the tests are stable.  Please continue the same plan.  There is no other need for change of treatment or further evaluation based on these results, at this time.  thanks

## 2024-07-18 NOTE — Progress Notes (Signed)
 Patient ID: Annette [name], female   DOB: January 10, 1956, 68 y.o.   MRN: [id number]        Chief Complaint: follow up low back pain and overall myalgias, hld       HPI:  Annette [name] is a 68 y.o. female here with c/o 2 months persistent low back pain with bilateral sciatica right > left, and with bilateral weakness she feels, no bowel or bladder symptoms. Also has rather diffuse myalgias all over for 3 mo, worse in the AM, wondering about the statin.  Pt denies chest pain, increased sob or doe, wheezing, orthopnea, PND, increased LE swelling, palpitations, dizziness or syncope.   Pt denies polydipsia, polyuria, or new focal neuro s/s.   Does alsso have pain to the plantar distal foot at a certain point with a small firmness between the 2nd and third toes.  Also due for colonoscopy later this yr.         Wt Readings from Last 3 Encounters:  07/18/24 173 lb 4 oz (78.6 kg)  02/07/24 170 lb (77.1 kg)  02/03/23 169 lb (76.7 kg)   BP Readings from Last 3 Encounters:  07/18/24 108/62  03/11/24 117/65  02/23/24 111/70         Past Medical History:  Diagnosis Date   ALLERGIC RHINITIS 10/01/2007   ANXIETY 11/10/2010   ATTENTION DEFICIT DISORDER 11/10/2010   BARTHOLIN'S CYST 08/20/2007   Carpal tunnel syndrome 08/20/2007   COLONIC POLYPS, HX OF 08/20/2007   DEPRESSION 08/20/2007   EMPHYSEMA 06/16/2010   HOT FLASHES 09/26/2009   HYPERLIPIDEMIA 08/20/2007   HYPERTENSION 08/20/2007   Insomnia, unspecified 10/01/2007   ISCHEMIC COLITIS, HX OF 10/01/2007   MENOPAUSAL DISORDER 11/07/2009   OBESITY, MILD 08/20/2007   OSTEOPENIA 06/16/2010   POLYARTHRALGIA 11/06/2008   Raynaud's syndrome 08/20/2007   Past Surgical History:  Procedure Laterality Date   TONSILLECTOMY      reports that she quit smoking about 33 years ago. Her smoking use included cigarettes. She has never used smokeless tobacco. She reports current alcohol use. She reports that she does not use drugs. family history includes ADD / ADHD in her  daughter; Breast cancer in her maternal grandmother and another family member; Breast cancer (age of onset: 30) in her niece; Coronary artery disease in an other family member; Hypertension in an other family member; Osteoporosis in her maternal grandmother and mother; Pancreatic cancer in her father; Prostate cancer in an other family member. No Known Allergies Current Outpatient Medications on File Prior to Visit  Medication Sig Dispense Refill   amLODipine  (NORVASC ) 2.5 MG tablet Take 1 tablet (2.5 mg total) by mouth daily. 90 tablet 3   aspirin  EC 81 MG tablet Take 1 tablet (81 mg total) by mouth daily. Swallow whole. 90 tablet 3   benzonatate  (TESSALON ) 100 MG capsule Take 1 capsule (100 mg total) by mouth every 8 (eight) hours. 21 capsule 0   Estradiol  (VAGIFEM ) 10 MCG TABS vaginal tablet Place 1 tablet (10 mcg total) vaginally 2 (two) times a week. 24 tablet 3   fluticasone  (FLONASE ) 50 MCG/ACT nasal spray Place 2 sprays into both nostrils 2 (two) times daily as needed (Nasal congestion and drainage). 9.9 mL 0   promethazine -dextromethorphan (PROMETHAZINE -DM) 6.25-15 MG/5ML syrup Take 5 mLs by mouth 4 (four) times daily as needed for cough. 180 mL 0   rosuvastatin  (CRESTOR ) 20 MG tablet Take 1 tablet (20 mg total) by mouth daily. 90 tablet 3   zolpidem  (AMBIEN ) 10 MG  tablet Take 1 tablet (10 mg total) by mouth at bedtime as needed. 90 tablet 1   No current facility-administered medications on file prior to visit.        ROS:  All others reviewed and negative.  Objective        PE:  BP 108/62   Pulse 68   Temp 97.8 F (36.6 C) (Temporal)   Ht 5' 3 (1.6 m)   Wt 173 lb 4 oz (78.6 kg)   SpO2 96%   BMI 30.69 kg/m                 Constitutional: Pt appears in NAD               HENT: Head: NCAT.                Right Ear: External ear normal.                 Left Ear: External ear normal.                Eyes: . Pupils are equal, round, and reactive to light. Conjunctivae and EOM are  normal               Nose: without d/c or deformity               Neck: Neck supple. Gross normal ROM               Cardiovascular: Normal rate and regular rhythm.                 Pulmonary/Chest: Effort normal and breath sounds without rales or wheezing.                Abd:  Soft, NT, ND, + BS, no organomegaly               Neurological: Pt is alert. At baseline orientation, motor grossly intact               Skin: Skin is warm. No rashes, no other new lesions, LE edema - none               Psychiatric: Pt behavior is normal without agitation   Micro: none  Cardiac tracings I have personally interpreted today:  none  Pertinent Radiological findings (summarize): none   Lab Results  Component Value Date   WBC 6.7 07/18/2024   HGB 12.8 07/18/2024   HCT 39.6 07/18/2024   PLT 199.0 07/18/2024   GLUCOSE 83 07/18/2024   CHOL 142 02/02/2024   TRIG 100.0 02/02/2024   HDL 47.00 02/02/2024   LDLDIRECT 137.0 12/11/2013   LDLCALC 75 02/02/2024   ALT 17 07/18/2024   AST 24 07/18/2024   NA 139 07/18/2024   K 3.9 07/18/2024   CL 107 07/18/2024   CREATININE 1.05 07/18/2024   BUN 21 07/18/2024   CO2 26 07/18/2024   TSH 2.62 02/02/2024   HGBA1C 6.1 02/02/2024   Assessment/Plan:  Annette [name] is a 68 y.o. Black or African American [2] female with  has a past medical history of ALLERGIC RHINITIS (10/01/2007), ANXIETY (11/10/2010), ATTENTION DEFICIT DISORDER (11/10/2010), BARTHOLIN'S CYST (08/20/2007), Carpal tunnel syndrome (08/20/2007), COLONIC POLYPS, HX OF (08/20/2007), DEPRESSION (08/20/2007), EMPHYSEMA (06/16/2010), HOT FLASHES (09/26/2009), HYPERLIPIDEMIA (08/20/2007), HYPERTENSION (08/20/2007), Insomnia, unspecified (10/01/2007), ISCHEMIC COLITIS, HX OF (10/01/2007), MENOPAUSAL DISORDER (11/07/2009), OBESITY, MILD (08/20/2007), OSTEOPENIA (06/16/2010), POLYARTHRALGIA (11/06/2008), and Raynaud's syndrome (08/20/2007).  Chronic midline low back pain with bilateral sciatica With  constant worsening  over past 2 months, for MRI LS spine but also tx with tramadol  prn, prednisone  taper, and gabapentin  300 tid prn  Myalgia Also with more diffuse myalgias suggestive of FMS but will check sed rate, CPK  Right foot pain Mild to mod, not likely assoc with other issues today,  for podiatry referral, to f/u any worsening symptoms or concerns  Essential hypertension BP Readings from Last 3 Encounters:  07/18/24 108/62  03/11/24 117/65  02/23/24 111/70   Stable, pt to continue medical treatment norvasc  2.5 mg qd  Followup: No follow-ups on file.  Lynwood Rush, MD 07/18/2024 7:32 PM [facility]

## 2024-07-18 NOTE — Assessment & Plan Note (Signed)
 With constant worsening over past 2 months, for MRI LS spine but also tx with tramadol  prn, prednisone  taper, and gabapentin  300 tid prn

## 2024-07-31 ENCOUNTER — Ambulatory Visit: Payer: Self-pay | Admitting: Podiatry

## 2024-07-31 ENCOUNTER — Ambulatory Visit (INDEPENDENT_AMBULATORY_CARE_PROVIDER_SITE_OTHER)

## 2024-07-31 VITALS — Ht 63.0 in | Wt 173.2 lb

## 2024-07-31 DIAGNOSIS — M7751 Other enthesopathy of right foot: Secondary | ICD-10-CM | POA: Diagnosis not present

## 2024-07-31 DIAGNOSIS — M7741 Metatarsalgia, right foot: Secondary | ICD-10-CM

## 2024-07-31 NOTE — Patient Instructions (Addendum)
  VISIT SUMMARY: Today, you were seen for swelling and sensitivity under the second toe of your left foot, which has been bothering you for the past six months. We discussed your symptoms, reviewed your current treatments, and explored new options to help alleviate your discomfort.  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 We recommend using orthotic insoles with a metatarsal pad to redistribute pressure away from the affected joint. You can continue using over-the-counter anti-inflammatory medications like ibuprofen or Motrin as needed for pain relief. Applying ice and resting your foot can also help manage symptoms. If these measures do not improve your condition, we may consider a steroid injection into the joint. We also discussed the proper placement of the metatarsal pad behind the painful area to alleviate pressure. If symptoms persist, custom orthotics may be an option, and your [employer] may cover these.  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 If conservative measures are insufficient, we may consider custom orthotics to address the hypermobility of the first ray.  INSTRUCTIONS: Please follow the recommendations provided for managing your symptoms. If your condition does not improve with the suggested treatments, consider scheduling a follow-up appointment to discuss further options, including the possibility of custom orthotics or a steroid injection.                      Contains text generated by [employer].                                  Contains text generated by [employer]bridge.

## 2024-08-03 NOTE — Progress Notes (Signed)
 Subjective:  Patient ID: Annette [name], female    DOB: 08/02/1956,  MRN: [id number]  Chief Complaint  Patient presents with   Foot Pain    RM 2 Patient states swelling in the ball of the right foot. Pin has been present for the last 6 months. Pain is described as a dull achy pain. Patient has tried cold therapy and otc pain medication for relief.    Discussed the use of AI scribe software for clinical note transcription with the patient, who gave verbal consent to proceed.  History of Present Illness Annette [name] is a 69 year old female who presents with swelling and sensitivity under the second toe of her left foot.  She has been experiencing swelling and sensitivity under the second toe of her left foot for the past six months. The area is very sensitive and sometimes painful, particularly when walking or putting pressure on it. The pain is described as an ache rather than sharp pain, and it is worse when walking on her toes. No history of injury to the area is reported.  She has tried using Aleve  and Advil for pain relief and applying cold compresses, but these measures have not provided significant relief. She wears high-quality shoes, including [employer] and [employer], and has used [employer] orthotics, but these have not been particularly helpful in alleviating her symptoms.  She walks a lot and regularly uses a treadmill and goes to the gym. She has not used custom orthotics or insoles in the past. Her feet are generally sensitive and ache, but the swelling and sensitivity under the second toe are more pronounced.  The pain is not associated with any particular type of shoe.      Objective:    Physical Exam VASCULAR: DP and PT pulse palpable. Foot is warm and well-perfused. Capillary fill time is brisk. DERMATOLOGIC: Normal skin turgor, texture, and temperature. No open lesions, rashes, or ulcerations. NEUROLOGIC: Normal sensation to light touch and pressure. No  paresthesias. ORTHOPEDIC: Pain at second MTP joint plantarly. 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 Smooth pain-free range of motion of all other examined joints. No ecchymosis or bruising. No gross deformity.   No images are attached to the encounter.    Results RADIOLOGY Foot X-ray: Normal metatarsal parabola, mild degenerative change in the metatarsophalangeal (MTP) joint, hallux valgus deformity with bunion and hypermobility of the first ray (07/31/2024)   Assessment:   1. Metatarsalgia, right foot      Plan:  Patient was evaluated and treated and all questions answered.  Assessment and Plan Assessment & Plan Right second metatarsophalangeal (MTP) joint synovitis (metatarsalgia) Chronic inflammation of the right second MTP joint for approximately six months, with swelling and sensitivity under the second toe, exacerbated by walking and pressure. Radiographs show normal metatarsal parabola and mild degenerative changes. Likely due to mechanical issues from increased weight bearing through the second toe, possibly exacerbated by hallux valgus and first ray hypermobility. No evidence of stress fracture or ligament tear. Over-the-counter anti-inflammatories and cold compresses have been ineffective. - Recommend orthotic insoles with a metatarsal pad to redistribute pressure away from the second MTP joint. - Advise use of over-the-counter anti-inflammatory medications such as ibuprofen or Motrin as needed for pain. - Suggest application of ice and rest to manage symptoms. - Consider steroid injection into the joint if symptoms do not improve with conservative measures, due to potential side effects. - Educate on proper placement of metatarsal pad behind the  painful area to alleviate pressure. - Discuss potential for custom orthotics if symptoms persist, noting [employer] may cover these.  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 - Consider custom orthotics to address first ray hypermobility if conservative measures are insufficient.      Return if symptoms worsen or fail to improve.

## 2024-08-09 ENCOUNTER — Ambulatory Visit: Admitting: Physical Medicine and Rehabilitation

## 2024-08-15 ENCOUNTER — Other Ambulatory Visit

## 2024-08-21 ENCOUNTER — Encounter: Payer: Self-pay | Admitting: Internal Medicine

## 2024-08-29 LAB — HM DEXA SCAN

## 2024-09-04 ENCOUNTER — Encounter: Payer: Self-pay | Admitting: Internal Medicine

## 2024-09-04 ENCOUNTER — Ambulatory Visit (INDEPENDENT_AMBULATORY_CARE_PROVIDER_SITE_OTHER): Admitting: Internal Medicine

## 2024-09-04 VITALS — BP 112/78 | HR 65 | Temp 98.3°F | Ht 63.0 in | Wt 172.6 lb

## 2024-09-04 DIAGNOSIS — Z803 Family history of malignant neoplasm of breast: Secondary | ICD-10-CM | POA: Diagnosis not present

## 2024-09-04 DIAGNOSIS — E538 Deficiency of other specified B group vitamins: Secondary | ICD-10-CM

## 2024-09-04 DIAGNOSIS — E559 Vitamin D deficiency, unspecified: Secondary | ICD-10-CM

## 2024-09-04 DIAGNOSIS — M810 Age-related osteoporosis without current pathological fracture: Secondary | ICD-10-CM | POA: Diagnosis not present

## 2024-09-04 DIAGNOSIS — R739 Hyperglycemia, unspecified: Secondary | ICD-10-CM

## 2024-09-04 DIAGNOSIS — N1831 Chronic kidney disease, stage 3a: Secondary | ICD-10-CM | POA: Diagnosis not present

## 2024-09-04 DIAGNOSIS — R002 Palpitations: Secondary | ICD-10-CM | POA: Diagnosis not present

## 2024-09-04 DIAGNOSIS — E7800 Pure hypercholesterolemia, unspecified: Secondary | ICD-10-CM

## 2024-09-04 DIAGNOSIS — Z1211 Encounter for screening for malignant neoplasm of colon: Secondary | ICD-10-CM | POA: Diagnosis not present

## 2024-09-04 MED ORDER — ALENDRONATE SODIUM 70 MG PO TABS: 70.0000 mg | ORAL_TABLET | ORAL | 3 refills | Status: AC

## 2024-09-04 NOTE — Progress Notes (Unsigned)
 Patient ID: Annette [name], female   DOB: 1956-08-22, 68 y.o.   MRN: [id number]        Chief Complaint: follow up osteoporosis, irreg heart beat, hld, family hx breast ca, ckd3a,        HPI:  Annette [name] is a 68 y.o. female here after recent visit to study facility for OA prevention in women in [place].  Also with recent DXA with worst t score of  -2.5.   Pt denies chest pain, increased sob or doe, wheezing, orthopnea, PND, increased LE swelling, dizziness or syncope, but has had frequent palpitations and requesting Card referral.  Also recent Card CT score of Zero.  Also has multiple 1 and 2nd degree relatives with breast ca, one at 66yo.  Due for flu shot today, also colonoscopy. Has appt with GYN next wk, and may consider prolia start       Wt Readings from Last 3 Encounters:  09/04/24 172 lb 9.6 oz (78.3 kg)  07/31/24 173 lb 4 oz (78.6 kg)  07/18/24 173 lb 4 oz (78.6 kg)   BP Readings from Last 3 Encounters:  09/04/24 112/78  07/18/24 108/62  03/11/24 117/65         Past Medical History:  Diagnosis Date   ALLERGIC RHINITIS 10/01/2007   ANXIETY 11/10/2010   ATTENTION DEFICIT DISORDER 11/10/2010   BARTHOLIN'S CYST 08/20/2007   Carpal tunnel syndrome 08/20/2007   COLONIC POLYPS, HX OF 08/20/2007   DEPRESSION 08/20/2007   EMPHYSEMA 06/16/2010   HOT FLASHES 09/26/2009   HYPERLIPIDEMIA 08/20/2007   HYPERTENSION 08/20/2007   Insomnia, unspecified 10/01/2007   ISCHEMIC COLITIS, HX OF 10/01/2007   MENOPAUSAL DISORDER 11/07/2009   OBESITY, MILD 08/20/2007   OSTEOPENIA 06/16/2010   POLYARTHRALGIA 11/06/2008   Raynaud's syndrome 08/20/2007   Past Surgical History:  Procedure Laterality Date   TONSILLECTOMY      reports that she quit smoking about 33 years ago. Her smoking use included cigarettes. She has never used smokeless tobacco. She reports current alcohol use. She reports that she does not use drugs. family history includes ADD / ADHD in her daughter; Breast cancer in her  maternal grandmother and another family member; Breast cancer (age of onset: 25) in her niece; Coronary artery disease in an other family member; Hypertension in an other family member; Osteoporosis in her maternal grandmother and mother; Pancreatic cancer in her father; Prostate cancer in an other family member. No Known Allergies Current Outpatient Medications on File Prior to Visit  Medication Sig Dispense Refill   albuterol  (VENTOLIN  HFA) 108 (90 Base) MCG/ACT inhaler Inhale 2 puffs into the lungs every 6 (six) hours as needed for wheezing or shortness of breath. 3 each 3   aspirin  EC 81 MG tablet Take 1 tablet (81 mg total) by mouth daily. Swallow whole. 90 tablet 3   Estradiol  (VAGIFEM ) 10 MCG TABS vaginal tablet Place 1 tablet (10 mcg total) vaginally 2 (two) times a week. 24 tablet 3   fluticasone  (FLONASE ) 50 MCG/ACT nasal spray Place 2 sprays into both nostrils 2 (two) times daily as needed (Nasal congestion and drainage). 9.9 mL 0   gabapentin  (NEURONTIN ) 300 MG capsule Take 1 capsule (300 mg total) by mouth 3 (three) times daily as needed. 90 capsule 5   rosuvastatin  (CRESTOR ) 20 MG tablet Take 1 tablet (20 mg total) by mouth daily. 90 tablet 3   traMADol  (ULTRAM ) 50 MG tablet Take 1 tablet (50 mg total) by mouth every 6 (six) hours  as needed. 30 tablet 0   zolpidem  (AMBIEN ) 10 MG tablet Take 1 tablet (10 mg total) by mouth at bedtime as needed. 90 tablet 1   amLODipine  (NORVASC ) 2.5 MG tablet Take 1 tablet (2.5 mg total) by mouth daily. (Patient not taking: Reported on 09/04/2024) 90 tablet 3   No current facility-administered medications on file prior to visit.        ROS:  All others reviewed and negative.  Objective        PE:  BP 112/78   Pulse 65   Temp 98.3 F (36.8 C)   Ht 5' 3 (1.6 m)   Wt 172 lb 9.6 oz (78.3 kg)   SpO2 96%   BMI 30.57 kg/m                 Constitutional: Pt appears in NAD               HENT: Head: NCAT.                Right Ear: External ear  normal.                 Left Ear: External ear normal.                Eyes: . Pupils are equal, round, and reactive to light. Conjunctivae and EOM are normal               Nose: without d/c or deformity               Neck: Neck supple. Gross normal ROM               Cardiovascular: Normal rate and regular rhythm.                 Pulmonary/Chest: Effort normal and breath sounds without rales or wheezing.                Abd:  Soft, NT, ND, + BS, no organomegaly               Neurological: Pt is alert. At baseline orientation, motor grossly intact               Skin: Skin is warm. No rashes, no other new lesions, LE edema - none               Psychiatric: Pt behavior is normal without agitation   Micro: none  Cardiac tracings I have personally interpreted today:  none  Pertinent Radiological findings (summarize): none   Lab Results  Component Value Date   WBC 6.7 07/18/2024   HGB 12.8 07/18/2024   HCT 39.6 07/18/2024   PLT 199.0 07/18/2024   GLUCOSE 83 07/18/2024   CHOL 142 02/02/2024   TRIG 100.0 02/02/2024   HDL 47.00 02/02/2024   LDLDIRECT 137.0 12/11/2013   LDLCALC 75 02/02/2024   ALT 17 07/18/2024   AST 24 07/18/2024   NA 139 07/18/2024   K 3.9 07/18/2024   CL 107 07/18/2024   CREATININE 1.05 07/18/2024   BUN 21 07/18/2024   CO2 26 07/18/2024   TSH 2.62 02/02/2024   HGBA1C 6.1 02/02/2024   Assessment/Plan:  Annette [name] is a 68 y.o. Black or African American [2] female with  has a past medical history of ALLERGIC RHINITIS (10/01/2007), ANXIETY (11/10/2010), ATTENTION DEFICIT DISORDER (11/10/2010), BARTHOLIN'S CYST (08/20/2007), Carpal tunnel syndrome (08/20/2007), COLONIC POLYPS, HX OF (08/20/2007), DEPRESSION (08/20/2007), EMPHYSEMA (06/16/2010), HOT FLASHES (09/26/2009), HYPERLIPIDEMIA (  08/20/2007), HYPERTENSION (08/20/2007), Insomnia, unspecified (10/01/2007), ISCHEMIC COLITIS, HX OF (10/01/2007), MENOPAUSAL DISORDER (11/07/2009), OBESITY, MILD (08/20/2007), OSTEOPENIA  (06/16/2010), POLYARTHRALGIA (11/06/2008), and Raynaud's syndrome (08/20/2007).  Osteoporosis Recent new onset, for fosamax  70 mg weekly  Vitamin D  deficiency Last vitamin D  Lab Results  Component Value Date   VD25OH 27.07 (L) 02/02/2024   Low, to start oral replacement   Stage 3a chronic kidney disease (HCC) Lab Results  Component Value Date   CREATININE 1.05 07/18/2024   Stable overall, cont to avoid nephrotoxins   Family history of breast cancer Pt for genetic counseling referral  Palpitations Etiology unclear- declines card monitor and only wants cardiology referral  Screen for colon cancer Also for screening colonoscopy  Followup: Return in about 6 months (around 03/04/2025).  Lynwood Rush, MD 09/06/2024 8:46 PM [facility]

## 2024-09-04 NOTE — Patient Instructions (Signed)
 You had the flu shot today  Please take all new medication as prescribed - the fosamax  if your GYN does not do the prolia  Please continue all other medications as before, and refills have been done if requested.  Please have the pharmacy call with any other refills you may need.  Please continue your efforts at being more active, low cholesterol diet, and weight control.  You are otherwise up to date with prevention measures today.  Please keep your appointments with your specialists as you may have planned  You will be contacted regarding the referral for: colonoscopy, and cardiology, as well as Genetic Counseling  We can hold on lab testing today  Please make an Appointment to return in 6 months, or sooner if needed, also with Lab Appointment for testing done 3-5 days before at the FIRST FLOOR Lab (so this is for TWO appointments - please see the scheduling desk as you leave)

## 2024-09-06 ENCOUNTER — Encounter: Payer: Self-pay | Admitting: Internal Medicine

## 2024-09-06 DIAGNOSIS — M810 Age-related osteoporosis without current pathological fracture: Secondary | ICD-10-CM | POA: Insufficient documentation

## 2024-09-06 DIAGNOSIS — Z1211 Encounter for screening for malignant neoplasm of colon: Secondary | ICD-10-CM | POA: Insufficient documentation

## 2024-09-06 NOTE — Assessment & Plan Note (Signed)
 Recent new onset, for fosamax  70 mg weekly

## 2024-09-06 NOTE — Assessment & Plan Note (Signed)
 Lab Results  Component Value Date   CREATININE 1.05 07/18/2024   Stable overall, cont to avoid nephrotoxins

## 2024-09-06 NOTE — Assessment & Plan Note (Addendum)
 Also for screening colonoscopy

## 2024-09-06 NOTE — Assessment & Plan Note (Signed)
 Last vitamin D Lab Results  Component Value Date   VD25OH 27.07 (L) 02/02/2024   Low, to start oral replacement

## 2024-09-06 NOTE — Assessment & Plan Note (Signed)
 Etiology unclear- declines card monitor and only wants cardiology referral

## 2024-09-06 NOTE — Assessment & Plan Note (Signed)
 Pt for genetic counseling referral

## 2024-10-02 ENCOUNTER — Encounter: Payer: Self-pay | Admitting: Internal Medicine

## 2024-10-15 ENCOUNTER — Encounter: Payer: Self-pay | Admitting: Internal Medicine

## 2024-10-15 DIAGNOSIS — L989 Disorder of the skin and subcutaneous tissue, unspecified: Secondary | ICD-10-CM

## 2024-10-24 ENCOUNTER — Telehealth: Payer: Self-pay

## 2024-10-24 NOTE — Telephone Encounter (Signed)
 Copied from CRM [id number]. Topic: Clinical - Lab/Test Results >> Oct 24, 2024  9:13 AM Annette [name] HERO wrote: Reason for CRM: Pt scheduled CPE for Feb 2026 and wanted me to send reminder msg to put lab orders in so she can complete before her appt. She would like to request confirmation via Mychart once the orders are put in.

## 2024-10-26 NOTE — Telephone Encounter (Signed)
 Labs are already ordered

## 2024-10-29 ENCOUNTER — Encounter: Payer: Self-pay | Admitting: Radiology

## 2024-11-01 ENCOUNTER — Ambulatory Visit

## 2024-11-06 ENCOUNTER — Encounter: Payer: Self-pay | Admitting: Internal Medicine

## 2024-11-13 ENCOUNTER — Ambulatory Visit

## 2024-11-13 VITALS — BP 122/78 | HR 66 | Ht 63.0 in | Wt 167.6 lb

## 2024-11-13 DIAGNOSIS — K635 Polyp of colon: Secondary | ICD-10-CM | POA: Diagnosis not present

## 2024-11-13 DIAGNOSIS — Z0000 Encounter for general adult medical examination without abnormal findings: Secondary | ICD-10-CM

## 2024-11-13 NOTE — Patient Instructions (Addendum)
 Ms. Yarbough,  Thank you for taking the time for your Medicare Wellness Visit. I appreciate your continued commitment to your health goals. Please review the care plan we discussed, and feel free to reach out if I can assist you further.  Please note that Annual Wellness Visits do not include a physical exam. Some assessments may be limited, especially if the visit was conducted virtually. If needed, we may recommend an in-person follow-up with your provider.  Ongoing Care Seeing your primary care provider every 3 to 6 months helps us  monitor your health and provide consistent, personalized care.   Referrals If a referral was made during today's visit and you haven't received any updates within two weeks, please contact the referred provider directly to check on the status.  Recommended Screenings:  Health Maintenance  Topic Date Due   DTaP/Tdap/Td vaccine (2 - Tdap) 11/10/2020   COVID-19 Vaccine (6 - 2025-26 season) 08/27/2024   Colon Cancer Screening  12/02/2024   Breast Cancer Screening  11/08/2025   Medicare Annual Wellness Visit  11/13/2025   Pneumococcal Vaccine for age over 44  Completed   Flu Shot  Completed   DEXA scan (bone density measurement)  Completed   Hepatitis C Screening  Completed   Zoster (Shingles) Vaccine  Completed   Meningitis B Vaccine  Aged Out       11/13/2024   12:42 PM  Advanced Directives  Does Patient Have a Medical Advance Directive? Yes  Type of Estate Agent of [place];Living will  Copy of Healthcare Power of Attorney in Chart? No - copy requested    Vision: Annual vision screenings are recommended for early detection of glaucoma, cataracts, and diabetic retinopathy. These exams can also reveal signs of chronic conditions such as diabetes and high blood pressure.  Dental: Annual dental screenings help detect early signs of oral cancer, gum disease, and other conditions linked to overall health, including heart disease and  diabetes.  Managing Pain Without Opioids Opioids are strong medicines used to treat moderate to severe pain. For some people, especially those who have long-term (chronic) pain, opioids may not be the best choice for pain management due to: Side effects like nausea, constipation, and sleepiness. The risk of addiction (opioid use disorder). The longer you take opioids, the greater your risk of addiction. Pain that lasts for more than 3 months is called chronic pain. Managing chronic pain usually requires more than one approach and is often provided by a team of health care providers working together (multidisciplinary approach). Pain management may be done at a pain management center or pain clinic. How to manage pain without the use of opioids Use non-opioid medicines Non-opioid medicines for pain may include: Over-the-counter or prescription non-steroidal anti-inflammatory drugs (NSAIDs). These may be the first medicines used for pain. They work well for muscle and bone pain, and they reduce swelling. Acetaminophen . This over-the-counter medicine may work well for milder pain but not swelling. Antidepressants. These may be used to treat chronic pain. A certain type of antidepressant (tricyclics) is often used. These medicines are given in lower doses for pain than when used for depression. Anticonvulsants. These are usually used to treat seizures but may also reduce nerve (neuropathic) pain. Muscle relaxants. These relieve pain caused by sudden muscle tightening (spasms). You may also use a pain medicine that is applied to the skin as a patch, cream, or gel (topical analgesic), such as a numbing medicine. These may cause fewer side effects than medicines taken by  mouth. Do certain therapies as directed Some therapies can help with pain management. They include: Physical therapy. You will do exercises to gain strength and flexibility. A physical therapist may teach you exercises to move and stretch  parts of your body that are weak, stiff, or painful. You can learn these exercises at physical therapy visits and practice them at home. Physical therapy may also involve: Massage. Heat wraps or applying heat or cold to affected areas. Electrical signals that interrupt pain signals (transcutaneous electrical nerve stimulation, TENS). Weak lasers that reduce pain and swelling (low-level laser therapy). Signals from your body that help you learn to regulate pain (biofeedback). Occupational therapy. This helps you to learn ways to function at home and work with less pain. Recreational therapy. This involves trying new activities or hobbies, such as a physical activity or drawing. Mental health therapy, including: Cognitive behavioral therapy (CBT). This helps you learn coping skills for dealing with pain. Acceptance and commitment therapy (ACT) to change the way you think and react to pain. Relaxation therapies, including muscle relaxation exercises and mindfulness-based stress reduction. Pain management counseling. This may be individual, family, or group counseling.  Receive medical treatments Medical treatments for pain management include: Nerve block injections. These may include a pain blocker and anti-inflammatory medicines. You may have injections: Near the spine to relieve chronic back or neck pain. Into joints to relieve back or joint pain. Into nerve areas that supply a painful area to relieve body pain. Into muscles (trigger point injections) to relieve some painful muscle conditions. A medical device placed near your spine to help block pain signals and relieve nerve pain or chronic back pain (spinal cord stimulation device). Acupuncture. Follow these instructions at home Medicines Take over-the-counter and prescription medicines only as told by your health care provider. If you are taking pain medicine, ask your health care providers about possible side effects to watch out  for. Do not drive or use heavy machinery while taking prescription opioid pain medicine. Lifestyle  Do not use drugs or alcohol to reduce pain. If you drink alcohol, limit how much you have to: 0-1 drink a day for women who are not pregnant. 0-2 drinks a day for men. Know how much alcohol is in a drink. In the [place] one drink equals one 12 oz bottle of beer (355 mL), one 5 oz glass of wine (148 mL), or one 1 oz glass of hard liquor (44 mL). Do not use any products that contain nicotine or tobacco. These products include cigarettes, chewing tobacco, and vaping devices, such as e-cigarettes. If you need help quitting, ask your health care provider. Eat a healthy diet and maintain a healthy weight. Poor diet and excess weight may make pain worse. Eat foods that are high in fiber. These include fresh fruits and vegetables, whole grains, and beans. Limit foods that are high in fat and processed sugars, such as fried and sweet foods. Exercise regularly. Exercise lowers stress and may help relieve pain. Ask your health care provider what activities and exercises are safe for you. If your health care provider approves, join an exercise class that combines movement and stress reduction. Examples include yoga and tai chi. Get enough sleep. Lack of sleep may make pain worse. Lower stress as much as possible. Practice stress reduction techniques as told by your therapist. General instructions Work with all your pain management providers to find the treatments that work best for you. You are an important member of your pain management team.  There are many things you can do to reduce pain on your own. Consider joining an online or in-person support group for people who have chronic pain. Keep all follow-up visits. This is important. Where to find more information You can find more information about managing pain without opioids from: American Academy of Pain Medicine: painmed.org Institute for Chronic  Pain: instituteforchronicpain.org American Chronic Pain Association: theacpa.org Contact a health care provider if: You have side effects from pain medicine. Your pain gets worse or does not get better with treatments or home therapy. You are struggling with anxiety or depression. Summary Many types of pain can be managed without opioids. Chronic pain may respond better to pain management without opioids. Pain is best managed when you and a team of health care providers work together. Pain management without opioids may include non-opioid medicines, medical treatments, physical therapy, mental health therapy, and lifestyle changes. Tell your health care providers if your pain gets worse or is not being managed well enough. This information is not intended to replace advice given to you by your health care provider. Make sure you discuss any questions you have with your health care provider. Document Revised: 03/25/2021 Document Reviewed: 03/25/2021 Elsevier Patient Education  2024 [employer].

## 2024-11-13 NOTE — Progress Notes (Signed)
 No chief complaint on file.    Subjective:   Annette [name] is a 68 y.o. female who presents for a Medicare Annual Wellness Visit.  Allergies (verified) Patient has no known allergies.   History: Past Medical History:  Diagnosis Date   ALLERGIC RHINITIS 10/01/2007   ANXIETY 11/10/2010   ATTENTION DEFICIT DISORDER 11/10/2010   BARTHOLIN'S CYST 08/20/2007   Carpal tunnel syndrome 08/20/2007   COLONIC POLYPS, HX OF 08/20/2007   DEPRESSION 08/20/2007   EMPHYSEMA 06/16/2010   HOT FLASHES 09/26/2009   HYPERLIPIDEMIA 08/20/2007   HYPERTENSION 08/20/2007   Insomnia, unspecified 10/01/2007   ISCHEMIC COLITIS, HX OF 10/01/2007   MENOPAUSAL DISORDER 11/07/2009   OBESITY, MILD 08/20/2007   OSTEOPENIA 06/16/2010   POLYARTHRALGIA 11/06/2008   Raynaud's syndrome 08/20/2007   Past Surgical History:  Procedure Laterality Date   TONSILLECTOMY     Family History  Problem Relation Age of Onset   Osteoporosis Mother    Pancreatic cancer Father        dx. 76s   Osteoporosis Maternal Grandmother    Breast cancer Maternal Grandmother        dx. 28s   ADD / ADHD Daughter    Coronary artery disease Other    Hypertension Other    Prostate cancer Other    Breast cancer Other        dx. 22s   Breast cancer Niece 37   Colon cancer Neg Hx    Social History   Occupational History    Employer: [employer]  Tobacco Use   Smoking status: Former    Current packs/day: 0.00    Types: Cigarettes    Quit date: 12/27/1990    Years since quitting: 33.9   Smokeless tobacco: Never  Substance and Sexual Activity   Alcohol use: Yes    Alcohol/week: 1.0 standard drink of alcohol    Types: 1 Glasses of wine per week    Comment: occ   Drug use: No   Sexual activity: Yes   Tobacco Counseling Counseling given: Not Answered  SDOH Screenings   Food Insecurity: No Food Insecurity (11/13/2024)  Housing: Unknown (11/13/2024)  Transportation Needs: No Transportation Needs (11/13/2024)   Utilities: Not At Risk (11/13/2024)  Depression (PHQ2-9): Low Risk  (11/13/2024)  Physical Activity: Sufficiently Active (11/13/2024)  Social Connections: Socially Integrated (11/13/2024)  Stress: No Stress Concern Present (11/13/2024)  Tobacco Use: Medium Risk (11/13/2024)  Health Literacy: Adequate Health Literacy (11/13/2024)   See flowsheets for full screening details  Depression Screen PHQ 2 & 9 Depression Scale- Over the past 2 weeks, how often have you been bothered by any of the following problems? Little interest or pleasure in doing things: 0 Feeling down, depressed, or hopeless (PHQ Adolescent also includes...irritable): 0 PHQ-2 Total Score: 0 Trouble falling or staying asleep, or sleeping too much: 0 Feeling tired or having little energy: 0 Poor appetite or overeating (PHQ Adolescent also includes...weight loss): 0 Feeling bad about yourself - or that you are a failure or have let yourself or your family down: 0 Trouble concentrating on things, such as reading the newspaper or watching television (PHQ Adolescent also includes...like school work): 0 Moving or speaking so slowly that other people could have noticed. Or the opposite - being so fidgety or restless that you have been moving around a lot more than usual: 0 Thoughts that you would be better off dead, or of hurting yourself in some way: 0 PHQ-9 Total Score: 0 If you checked off  any problems, how difficult have these problems made it for you to do your work, take care of things at home, or get along with other people?: Not difficult at all  Depression Treatment Depression Interventions/Treatment : EYV7-0 Score <4 Follow-up Not Indicated; Medication; Currently on Treatment (takes Ambien  prn)     Goals Addressed               This Visit's Progress     Patient Stated (pt-stated)        Patient stated she plans to continue exercising and want to lose weight       Visit info / Clinical Intake: Medicare  Wellness Visit Type:: Initial Annual Wellness Visit Persons participating in visit:: patient Medicare Wellness Visit Mode:: In-person (required for WTM) Information given by:: patient Interpreter Needed?: No Pre-visit prep was completed: yes AWV questionnaire completed by patient prior to visit?: no Living arrangements:: lives with spouse/significant other Patient's Overall Health Status Rating: very good Typical amount of pain: none Does pain affect daily life?: no Are you currently prescribed opioids?: no  Dietary Habits and Nutritional Risks How many meals a day?: 3 (2-3) Eats fruit and vegetables daily?: yes Most meals are obtained by: preparing own meals; eating out In the last 2 weeks, have you had any of the following?: none Diabetic:: no  Functional Status Activities of Daily Living (to include ambulation/medication): Independent Ambulation: Independent with device- listed below Home Assistive Devices/Equipment: Eyeglasses Medication Administration: Independent Home Management: Independent Manage your own finances?: yes Primary transportation is: driving Concerns about vision?: no *vision screening is required for WTM* Concerns about hearing?: no  Fall Screening Falls in the past year?: 0 Number of falls in past year: 0 Was there an injury with Fall?: 0 Fall Risk Category Calculator: 0 Patient Fall Risk Level: Low Fall Risk  Fall Risk Patient at Risk for Falls Due to: No Fall Risks Fall risk Follow up: Falls evaluation completed; Falls prevention discussed  Home and Transportation Safety: All rugs have non-skid backing?: yes All stairs or steps have railings?: yes Grab bars in the bathtub or shower?: yes Have non-skid surface in bathtub or shower?: yes Good home lighting?: yes Regular seat belt use?: yes Hospital stays in the last year:: no  Cognitive Assessment Difficulty concentrating, remembering, or making decisions? : no Will 6CIT or Mini Cog be  Completed: yes What year is it?: 0 points What month is it?: 0 points Give patient an address phrase to remember (5 components): [address] [place], [place] About what time is it?: 0 points Count backwards from 20 to 1: 0 points Say the months of the year in reverse: 0 points Repeat the address phrase from earlier: 2 points (Drive) 6 CIT Score: 2 points  Advance Directives (For Healthcare) Does Patient Have a Medical Advance Directive?: Yes Type of Advance Directive: Healthcare Power of [place]; Living will Copy of Healthcare Power of Attorney in Chart?: No - copy requested Copy of Living Will in Chart?: No - copy requested  Reviewed/Updated  Reviewed/Updated: Reviewed All (Medical, Surgical, Family, Medications, Allergies, Care Teams, Patient Goals)        Objective:    Today's Vitals   11/13/24 1239  BP: 122/78  Pulse: 66  SpO2: 98%  Weight: 167 lb 9.6 oz (76 kg)  Height: 5' 3 (1.6 m)   Body mass index is 29.69 kg/m.  Current Medications (verified) Outpatient Encounter Medications as of 11/13/2024  Medication Sig   albuterol  (VENTOLIN  HFA) 108 (90 Base) MCG/ACT  inhaler Inhale 2 puffs into the lungs every 6 (six) hours as needed for wheezing or shortness of breath.   alendronate  (FOSAMAX ) 70 MG tablet Take 1 tablet (70 mg total) by mouth every 7 (seven) days. Take with a full glass of water on an empty stomach.   amLODipine  (NORVASC ) 2.5 MG tablet Take 1 tablet (2.5 mg total) by mouth daily. (Patient taking differently: Take 2.5 mg by mouth daily.)   aspirin  EC 81 MG tablet Take 1 tablet (81 mg total) by mouth daily. Swallow whole.   Estradiol  (VAGIFEM ) 10 MCG TABS vaginal tablet Place 1 tablet (10 mcg total) vaginally 2 (two) times a week.   fluticasone  (FLONASE ) 50 MCG/ACT nasal spray Place 2 sprays into both nostrils 2 (two) times daily as needed (Nasal congestion and drainage).   gabapentin  (NEURONTIN ) 300 MG capsule Take 1 capsule (300 mg total) by mouth 3 (three)  times daily as needed.   rosuvastatin  (CRESTOR ) 20 MG tablet Take 1 tablet (20 mg total) by mouth daily.   traMADol  (ULTRAM ) 50 MG tablet Take 1 tablet (50 mg total) by mouth every 6 (six) hours as needed.   zolpidem  (AMBIEN ) 10 MG tablet Take 1 tablet (10 mg total) by mouth at bedtime as needed.   No facility-administered encounter medications on file as of 11/13/2024.   Hearing/Vision screen Hearing Screening - Comments:: Denies hearing difficulties   Vision Screening - Comments:: Wears rx glasses - up to date with routine eye exams with  [facility] Immunizations and Health Maintenance Health Maintenance  Topic Date Due   DTaP/Tdap/Td (2 - Tdap) 11/10/2020   COVID-19 Vaccine (6 - 2025-26 season) 08/27/2024   Colonoscopy  12/02/2024   Mammogram  11/08/2025   Medicare Annual Wellness (AWV)  11/13/2025   Pneumococcal Vaccine: 50+ Years  Completed   Influenza Vaccine  Completed   DEXA SCAN  Completed   Hepatitis C Screening  Completed   Zoster Vaccines- Shingrix  Completed   Meningococcal B Vaccine  Aged Out        Assessment/Plan:  This is a routine wellness examination for Annette [name].  Patient Care Team: Norleen Lynwood ORN, MD as PCP - General Wendel Arun K, MD as PCP - Cardiology (Cardiology)  I have personally reviewed and noted the following in the patient's chart:   Medical and social history Use of alcohol, tobacco or illicit drugs  Current medications and supplements including opioid prescriptions. Functional ability and status Nutritional status Physical activity Advanced directives List of other physicians Hospitalizations, surgeries, and ER visits in previous 12 months Vitals Screenings to include cognitive, depression, and falls Referrals and appointments  Orders Placed This Encounter  Procedures   Ambulatory referral to Gastroenterology    Referral Priority:   Routine    Referral Type:   Consultation    Referral Reason:   Specialty Services Required     Referred to Provider:   Albertus Gordy HERO, MD    Number of Visits Requested:   1   In addition, I have reviewed and discussed with patient certain preventive protocols, quality metrics, and best practice recommendations. A written personalized care plan for preventive services as well as general preventive health recommendations were provided to patient.   Verdie HERO Saba, CMA   11/13/2024   Return in 1 year (on 11/13/2025).  After Visit Summary: (In Person-Declined) Patient declined AVS at this time.  Nurse Notes: Referral to Dr Albertus [facility]) for a repeat Colonoscopy (h/o colon polyps). Scheduled 2026 AWV  appt.

## 2024-11-25 ENCOUNTER — Other Ambulatory Visit: Payer: Self-pay

## 2024-11-25 ENCOUNTER — Ambulatory Visit: Admission: RE | Admit: 2024-11-25 | Discharge: 2024-11-25 | Disposition: A | Discharge status: Home / Self Care

## 2024-11-25 ENCOUNTER — Emergency Department (HOSPITAL_COMMUNITY): Admission: EM | Admit: 2024-11-25 | Discharge: 2024-11-25 | Disposition: A | Discharge status: Home / Self Care

## 2024-11-25 VITALS — BP 108/63 | HR 83 | Temp 99.3°F | Resp 18 | Ht 63.0 in | Wt 165.0 lb

## 2024-11-25 DIAGNOSIS — Z7982 Long term (current) use of aspirin: Secondary | ICD-10-CM | POA: Insufficient documentation

## 2024-11-25 DIAGNOSIS — D72829 Elevated white blood cell count, unspecified: Secondary | ICD-10-CM | POA: Insufficient documentation

## 2024-11-25 DIAGNOSIS — E871 Hypo-osmolality and hyponatremia: Secondary | ICD-10-CM | POA: Diagnosis not present

## 2024-11-25 DIAGNOSIS — R509 Fever, unspecified: Secondary | ICD-10-CM | POA: Diagnosis present

## 2024-11-25 DIAGNOSIS — B349 Viral infection, unspecified: Secondary | ICD-10-CM | POA: Diagnosis not present

## 2024-11-25 DIAGNOSIS — J069 Acute upper respiratory infection, unspecified: Secondary | ICD-10-CM

## 2024-11-25 LAB — RESP PANEL BY RT-PCR (RSV, FLU A&B, COVID)  RVPGX2
Influenza A by PCR: NEGATIVE
Influenza B by PCR: NEGATIVE
Resp Syncytial Virus by PCR: NEGATIVE
SARS Coronavirus 2 by RT PCR: NEGATIVE

## 2024-11-25 LAB — BASIC METABOLIC PANEL WITH GFR
Anion gap: 11 (ref 5–15)
BUN: 15 mg/dL (ref 8–23)
CO2: 21 mmol/L — ABNORMAL LOW (ref 22–32)
Calcium: 8.5 mg/dL — ABNORMAL LOW (ref 8.9–10.3)
Chloride: 100 mmol/L (ref 98–111)
Creatinine, Ser: 1.2 mg/dL — ABNORMAL HIGH (ref 0.44–1.00)
GFR, Estimated: 50 mL/min — ABNORMAL LOW (ref >60)
Glucose, Bld: 133 mg/dL — ABNORMAL HIGH (ref 70–99)
Potassium: 3.8 mmol/L (ref 3.5–5.1)
Sodium: 132 mmol/L — ABNORMAL LOW (ref 135–145)

## 2024-11-25 LAB — CBC
HCT: 36.6 % (ref 36.0–46.0)
Hemoglobin: 11.7 g/dL — ABNORMAL LOW (ref 12.0–15.0)
MCH: 27.7 pg (ref 26.0–34.0)
MCHC: 32 g/dL (ref 30.0–36.0)
MCV: 86.5 fL (ref 80.0–100.0)
Platelets: 158 K/uL (ref 150–400)
RBC: 4.23 MIL/uL (ref 3.87–5.11)
RDW: 14 % (ref 11.5–15.5)
WBC: 11.7 K/uL — ABNORMAL HIGH (ref 4.0–10.5)
nRBC: 0 % (ref 0.0–0.2)

## 2024-11-25 LAB — I-STAT CG4 LACTIC ACID, ED: Lactic Acid, Venous: 1 mmol/L (ref 0.5–1.9)

## 2024-11-25 LAB — POC COVID19/FLU A&B COMBO
Covid Antigen, POC: NEGATIVE
Influenza A Antigen, POC: NEGATIVE
Influenza B Antigen, POC: NEGATIVE

## 2024-11-25 LAB — GROUP A STREP BY PCR: Group A Strep by PCR: NOT DETECTED

## 2024-11-25 MED ORDER — SODIUM CHLORIDE 0.9 % IV BOLUS
1000.0000 mL | Freq: Once | INTRAVENOUS | Status: AC
  Administered 2024-11-25: 1000 mL via INTRAVENOUS

## 2024-11-25 MED ORDER — IBUPROFEN 800 MG PO TABS
800.0000 mg | ORAL_TABLET | Freq: Once | ORAL | Status: AC
  Administered 2024-11-25: 800 mg via ORAL
  Filled 2024-11-25: qty 1

## 2024-11-25 MED ORDER — ACETAMINOPHEN 325 MG PO TABS
650.0000 mg | ORAL_TABLET | Freq: Once | ORAL | Status: AC
  Administered 2024-11-25: 650 mg via ORAL
  Filled 2024-11-25: qty 2

## 2024-11-25 NOTE — ED Triage Notes (Signed)
 Pt c/o flu symptoms x's 4 days   Generalized body aches, elevated temp, scratchy throat  and throbbing headache  Pt also c/o being fatigued   Last tylenol  this am

## 2024-11-25 NOTE — ED Triage Notes (Signed)
 PT ambulatory to triage with reporting fever, chills, general malaise that has persisted. PT states that she was seen at urgent care today for the same symptoms.   Last tylenol  @ 12:00.   PT states that she has not taken ibuprofen due to renal issues.

## 2024-11-25 NOTE — ED Provider Notes (Signed)
 [place] EMERGENCY DEPARTMENT AT [facility] Provider Note   CSN: [id number] Arrival date & time: 11/25/24  1729     Patient presents with: URI and Fever   Annette [name] is a 68 y.o. female.  Presents today for fever, chills, fatigue, and sore throat.  Patient was seen at urgent care earlier today for same.  Patient reports she decided to come to the ED because of a high fever.  Patient has not taken Tylenol  since noon and reports she cannot take ibuprofen due to renal issues.    URI Presenting symptoms: fatigue, fever and sore throat   Fever Associated symptoms: sore throat        Prior to Admission medications   Medication Sig Start Date End Date Taking? Authorizing Provider  albuterol  (VENTOLIN  HFA) 108 (90 Base) MCG/ACT inhaler Inhale 2 puffs into the lungs every 6 (six) hours as needed for wheezing or shortness of breath. 07/18/24   Norleen Lynwood ORN, MD  alendronate  (FOSAMAX ) 70 MG tablet Take 1 tablet (70 mg total) by mouth every 7 (seven) days. Take with a full glass of water on an empty stomach. 09/04/24   Norleen Lynwood ORN, MD  amLODipine  (NORVASC ) 2.5 MG tablet Take 1 tablet (2.5 mg total) by mouth daily. Patient taking differently: Take 2.5 mg by mouth daily. 02/07/24   Norleen Lynwood ORN, MD  aspirin  EC 81 MG tablet Take 1 tablet (81 mg total) by mouth daily. Swallow whole. 02/05/22   Thukkani, Arun K, MD  Estradiol  (VAGIFEM ) 10 MCG TABS vaginal tablet Place 1 tablet (10 mcg total) vaginally 2 (two) times a week. 02/04/22   Norleen Lynwood ORN, MD  fluticasone  (FLONASE ) 50 MCG/ACT nasal spray Place 2 sprays into both nostrils 2 (two) times daily as needed (Nasal congestion and drainage). 02/23/24   Arloa Suzen RAMAN, NP  gabapentin  (NEURONTIN ) 300 MG capsule Take 1 capsule (300 mg total) by mouth 3 (three) times daily as needed. 07/18/24   Norleen Lynwood ORN, MD  rosuvastatin  (CRESTOR ) 20 MG tablet Take 1 tablet (20 mg total) by mouth daily. 02/07/24   Norleen Lynwood ORN, MD  traMADol   (ULTRAM ) 50 MG tablet Take 1 tablet (50 mg total) by mouth every 6 (six) hours as needed. 07/18/24   Norleen Lynwood ORN, MD  zolpidem  (AMBIEN ) 10 MG tablet Take 1 tablet (10 mg total) by mouth at bedtime as needed. 02/07/24   Norleen Lynwood ORN, MD    Allergies: Patient has no known allergies.    Review of Systems  Constitutional:  Positive for fatigue and fever.  HENT:  Positive for sore throat.     Updated Vital Signs BP 115/66   Pulse 89   Temp 99.7 F (37.6 C) (Oral)   Resp 18   Ht 5' 3 (1.6 m)   Wt 74.8 kg   SpO2 95%   BMI 29.23 kg/m   Physical Exam Vitals and nursing note reviewed.  Constitutional:      General: She is not in acute distress.    Appearance: She is well-developed.  HENT:     Head: Normocephalic and atraumatic.     Right Ear: External ear normal.     Left Ear: External ear normal.     Nose: Nose normal.     Mouth/Throat:     Mouth: Mucous membranes are moist.     Pharynx: Oropharynx is clear. Uvula midline. Posterior oropharyngeal erythema present. No pharyngeal swelling, oropharyngeal exudate or postnasal drip.  Tonsils: No tonsillar exudate or tonsillar abscesses.     Comments: Mild erythema in the oropharynx.  No sublingual or submandibular swelling. Eyes:     Extraocular Movements: Extraocular movements intact.     Conjunctiva/sclera: Conjunctivae normal.  Cardiovascular:     Rate and Rhythm: Normal rate and regular rhythm.     Heart sounds: No murmur heard. Pulmonary:     Effort: Pulmonary effort is normal. No respiratory distress.     Breath sounds: Normal breath sounds.  Abdominal:     Palpations: Abdomen is soft.     Tenderness: There is no abdominal tenderness.  Musculoskeletal:        General: No swelling.     Cervical back: Neck supple.  Skin:    General: Skin is warm and dry.     Capillary Refill: Capillary refill takes less than 2 seconds.  Neurological:     Mental Status: She is alert.  Psychiatric:        Mood and Affect: Mood  normal.     (all labs ordered are listed, but only abnormal results are displayed) Labs Reviewed  CBC - Abnormal; Notable for the following components:      Result Value   WBC 11.7 (*)    Hemoglobin 11.7 (*)    All other components within normal limits  BASIC METABOLIC PANEL WITH GFR - Abnormal; Notable for the following components:   Sodium 132 (*)    CO2 21 (*)    Glucose, Bld 133 (*)    Creatinine, Ser 1.20 (*)    Calcium  8.5 (*)    GFR, Estimated 50 (*)    All other components within normal limits  RESP PANEL BY RT-PCR (RSV, FLU A&B, COVID)  RVPGX2  GROUP A STREP BY PCR  I-STAT CG4 LACTIC ACID, ED    EKG: None  Radiology: No results found.   Procedures   Medications Ordered in the ED  acetaminophen  (TYLENOL ) tablet 650 mg (650 mg Oral Given 11/25/24 1825)  ibuprofen  (ADVIL ) tablet 800 mg (800 mg Oral Given 11/25/24 1940)  sodium chloride  0.9 % bolus 1,000 mL (1,000 mLs Intravenous New Bag/Given 11/25/24 2004)                                    Medical Decision Making Amount and/or Complexity of Data Reviewed Labs: ordered.  Risk OTC drugs. Prescription drug management.   This patient presents to the ED for concern of URI symptoms differential diagnosis includes COVID, flu, RSV, strep pharyngitis, viral pharyngitis, viral upper respiratory infection  Lab Tests:  I Ordered, and personally interpreted labs.  The pertinent results include: Negative respiratory panel, strep PCR negative, lactic acid 1, mild leukocytosis at 11.7, mild hyponatremia 132, decreased CO2 at 21, mildly elevated creatinine at 1.2 from a baseline of approximately 1.1  Medicines ordered and prescription drug management:  I ordered medication including Tylenol  and ibuprofen , IVF    I have reviewed the patients home medicines and have made adjustments as needed   Problem List / ED Course:  Considered for admission or further workup however patients vital signs, physical exam, labs,  and imaging are reassuring. Patient's symptoms likely due to upper respiratory infection. Patient advised to take Tylenol /Motrin  as needed for fever, Flonase  as needed for nasal congestion, and plain Mucinex as needed for chest congestion. Patient should follow-up with their primary care in the upcoming week if their symptoms persist  for further evaluation and workup.      Final diagnoses:  Viral illness    ED Discharge Orders     None          Francis Ileana SAILOR, PA-C 11/25/24 2041    Neysa Caron PARAS, DO 11/25/24 2349

## 2024-11-25 NOTE — ED Notes (Signed)
 ..  The patient is A&OX4, ambulatory at d/c with independent steady gait, NAD. Pt verbalized understanding of d/c instructions and follow up care.

## 2024-11-25 NOTE — Discharge Instructions (Signed)

## 2024-11-25 NOTE — ED Notes (Signed)
 Ileana Eck, PA at bedside.

## 2024-11-25 NOTE — Discharge Instructions (Addendum)
 Today you were seen for a viral upper respiratory infection.  You may alternate taking Tylenol  and Motrin  every 4 hours as needed for fever and pain, Flonase  as needed for nasal congestion, and plain Mucinex as needed for chest congestion. Thank you for letting us  treat you today. After reviewing your labs and imaging, I feel you are safe to go home. Please follow up with your PCP in the next several days and provide them with your records from this visit. Return to the Emergency Room if pain becomes severe or symptoms worsen.

## 2024-11-25 NOTE — ED Provider Notes (Signed)
 [name]    CSN: [id number] Arrival date & time: 11/25/24  1347      History   Chief Complaint Chief Complaint  Patient presents with   Chills    Have several flu symptoms, headache, body aches, tight chest - Entered by patient   Fever    HPI Annette [name] is a 68 y.o. female.   Pt presents today due to scratchy throat, body aches, chills, and fever (highest temp was 101.8). Pt states that she has been using Mucinex and Tylenol  for symptoms without relief of symptoms. Pt denies known sick contacts.   The history is provided by the patient.  Fever   Past Medical History:  Diagnosis Date   ALLERGIC RHINITIS 10/01/2007   ANXIETY 11/10/2010   ATTENTION DEFICIT DISORDER 11/10/2010   BARTHOLIN'S CYST 08/20/2007   Carpal tunnel syndrome 08/20/2007   COLONIC POLYPS, HX OF 08/20/2007   DEPRESSION 08/20/2007   EMPHYSEMA 06/16/2010   HOT FLASHES 09/26/2009   HYPERLIPIDEMIA 08/20/2007   HYPERTENSION 08/20/2007   Insomnia, unspecified 10/01/2007   ISCHEMIC COLITIS, HX OF 10/01/2007   MENOPAUSAL DISORDER 11/07/2009   OBESITY, MILD 08/20/2007   OSTEOPENIA 06/16/2010   POLYARTHRALGIA 11/06/2008   Raynaud's syndrome 08/20/2007    Patient Active Problem List   Diagnosis Date Noted   Screen for colon cancer 09/06/2024   Osteoporosis 09/06/2024   Chronic midline low back pain with bilateral sciatica 07/18/2024   Right foot pain 07/18/2024   Myalgia 07/18/2024   Cerumen impaction 02/05/2023   Crampy pain 02/03/2023   History of adenomatous polyp of colon 02/03/2023   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   Vaginal discharge 02/03/2023   AC joint pain 08/25/2022   Tear of left supraspinatus tendon 07/14/2022   Genetic testing 04/23/2022   Acute bursitis of left shoulder 04/12/2022   Arthritis of left acromioclavicular joint 04/12/2022   Family history of pancreatic cancer 04/02/2022   Tibial collateral bursitis (Pellegrini-Stieda), left leg 03/10/2022   Vitamin D  deficiency  02/02/2022   Palpitations 02/02/2022   Stage 3a chronic kidney disease (HCC) 02/02/2022   Family history of breast cancer 02/02/2022   Baker's cyst of knee, left 02/02/2022   Aortic atherosclerosis 03/04/2021   Lower abdominal pain 02/01/2021   Pelvic pain 02/01/2021   Knee pain, bilateral 07/25/2019   Skin lesion 07/25/2019   Exposure to COVID-19 virus 07/25/2019   Urinary frequency 01/24/2019   Cough 11/08/2018   Nonallopathic lesion of rib cage 09/27/2018   Nonallopathic lesion of thoracic region 09/27/2018   Urinary retention 01/19/2017   Hamstring tendinitis of left thigh 01/19/2017   Nonallopathic lesion of cervical region 12/19/2015   Nonallopathic lesion of lumbosacral region 12/19/2015   Nonallopathic lesion of sacral region 12/19/2015   SI (sacroiliac) joint dysfunction 06/24/2015   Plantar fasciitis of right foot 01/21/2015   Hyperglycemia 01/08/2015   Pain in both feet 01/08/2015   Epigastric pain 06/11/2014   Rectal bleeding 06/11/2014   Iron deficiency anemia 11/20/2012   Acute pharyngitis 02/18/2012   Bacterial vaginal infection 11/12/2011   Encounter for well adult exam with abnormal findings 11/06/2011   ANXIETY 11/10/2010   ATTENTION DEFICIT DISORDER 11/10/2010   EMPHYSEMA 06/16/2010   BUNION, LEFT FOOT 06/16/2010   OSTEOPENIA 06/16/2010   MENOPAUSAL DISORDER 11/07/2009   UTI (urinary tract infection) 09/26/2009   HOT FLASHES 09/26/2009   POLYARTHRALGIA 11/06/2008   LUMBAR RADICULOPATHY, LEFT 11/06/2008   ALLERGIC RHINITIS 10/01/2007   Insomnia, unspecified 10/01/2007   ISCHEMIC COLITIS, HX OF  10/01/2007   Hyperlipidemia 08/20/2007   OBESITY, MILD 08/20/2007   Depression 08/20/2007   Carpal tunnel syndrome 08/20/2007   Essential hypertension 08/20/2007   Raynaud's syndrome 08/20/2007   BARTHOLIN'S CYST 08/20/2007   History of colonic polyps 08/20/2007    Past Surgical History:  Procedure Laterality Date   TONSILLECTOMY      OB History   No  obstetric history on file.      Home Medications    Prior to Admission medications   Medication Sig Start Date End Date Taking? Authorizing Provider  albuterol  (VENTOLIN  HFA) 108 (90 Base) MCG/ACT inhaler Inhale 2 puffs into the lungs every 6 (six) hours as needed for wheezing or shortness of breath. 07/18/24   Norleen Lynwood ORN, MD  alendronate  (FOSAMAX ) 70 MG tablet Take 1 tablet (70 mg total) by mouth every 7 (seven) days. Take with a full glass of water on an empty stomach. 09/04/24   Norleen Lynwood ORN, MD  amLODipine  (NORVASC ) 2.5 MG tablet Take 1 tablet (2.5 mg total) by mouth daily. Patient taking differently: Take 2.5 mg by mouth daily. 02/07/24   Norleen Lynwood ORN, MD  aspirin  EC 81 MG tablet Take 1 tablet (81 mg total) by mouth daily. Swallow whole. 02/05/22   Thukkani, Arun K, MD  Estradiol  (VAGIFEM ) 10 MCG TABS vaginal tablet Place 1 tablet (10 mcg total) vaginally 2 (two) times a week. 02/04/22   Norleen Lynwood ORN, MD  fluticasone  (FLONASE ) 50 MCG/ACT nasal spray Place 2 sprays into both nostrils 2 (two) times daily as needed (Nasal congestion and drainage). 02/23/24   Arloa Suzen RAMAN, NP  gabapentin  (NEURONTIN ) 300 MG capsule Take 1 capsule (300 mg total) by mouth 3 (three) times daily as needed. 07/18/24   Norleen Lynwood ORN, MD  rosuvastatin  (CRESTOR ) 20 MG tablet Take 1 tablet (20 mg total) by mouth daily. 02/07/24   Norleen Lynwood ORN, MD  traMADol  (ULTRAM ) 50 MG tablet Take 1 tablet (50 mg total) by mouth every 6 (six) hours as needed. 07/18/24   Norleen Lynwood ORN, MD  zolpidem  (AMBIEN ) 10 MG tablet Take 1 tablet (10 mg total) by mouth at bedtime as needed. 02/07/24   Norleen Lynwood ORN, MD    Family History Family History  Problem Relation Age of Onset   Osteoporosis Mother    Pancreatic cancer Father        dx. 10s   Osteoporosis Maternal Grandmother    Breast cancer Maternal Grandmother        dx. 14s   ADD / ADHD Daughter    Coronary artery disease Other    Hypertension Other    Prostate cancer Other     Breast cancer Other        dx. 16s   Breast cancer Niece 77   Colon cancer Neg Hx     Social History Social History   Tobacco Use   Smoking status: Former    Current packs/day: 0.00    Types: Cigarettes    Quit date: 12/27/1990    Years since quitting: 33.9   Smokeless tobacco: Never  Substance Use Topics   Alcohol use: Yes    Alcohol/week: 1.0 standard drink of alcohol    Types: 1 Glasses of wine per week    Comment: occ   Drug use: No     Allergies   Patient has no known allergies.   Review of Systems Review of Systems  Constitutional:  Positive for fever.     Physical Exam Triage  Vital Signs ED Triage Vitals  Encounter Vitals Group     BP 11/25/24 1414 108/63     Girls Systolic BP Percentile --      Girls Diastolic BP Percentile --      Boys Systolic BP Percentile --      Boys Diastolic BP Percentile --      Pulse Rate 11/25/24 1414 83     Resp 11/25/24 1414 18     Temp 11/25/24 1414 99.3 F (37.4 C)     Temp Source 11/25/24 1414 Oral     SpO2 11/25/24 1414 96 %     Weight 11/25/24 1415 165 lb (74.8 kg)     Height 11/25/24 1415 5' 3 (1.6 m)     Head Circumference --      Peak Flow --      Pain Score 11/25/24 1415 8     Pain Loc --      Pain Education --      Exclude from Growth Chart --    No data found.  Updated Vital Signs BP 108/63 (BP Location: Left Arm)   Pulse 83   Temp 99.3 F (37.4 C) (Oral)   Resp 18   Ht 5' 3 (1.6 m)   Wt 165 lb (74.8 kg)   SpO2 96%   BMI 29.23 kg/m   Visual Acuity Right Eye Distance:   Left Eye Distance:   Bilateral Distance:    Right Eye Near:   Left Eye Near:    Bilateral Near:     Physical Exam Vitals and nursing note reviewed.  Constitutional:      General: She is not in acute distress.    Appearance: Normal appearance. She is not ill-appearing, toxic-appearing or diaphoretic.  HENT:     Right Ear: Tympanic membrane, ear canal and external ear normal.     Left Ear: Tympanic membrane, ear  canal and external ear normal.     Nose: Congestion (mildly enlarged turbinates) present. No rhinorrhea.     Mouth/Throat:     Mouth: Mucous membranes are moist.     Pharynx: Oropharynx is clear. No oropharyngeal exudate or posterior oropharyngeal erythema.  Eyes:     General: No scleral icterus. Cardiovascular:     Rate and Rhythm: Normal rate and regular rhythm.     Heart sounds: Normal heart sounds.  Pulmonary:     Effort: Pulmonary effort is normal. No respiratory distress.     Breath sounds: Normal breath sounds. No wheezing or rhonchi.  Musculoskeletal:     Cervical back: No tenderness.  Lymphadenopathy:     Cervical: No cervical adenopathy.  Skin:    General: Skin is warm.  Neurological:     Mental Status: She is alert and oriented to person, place, and time.  Psychiatric:        Mood and Affect: Mood normal.        Behavior: Behavior normal.      UC Treatments / Results  Labs (all labs ordered are listed, but only abnormal results are displayed) Labs Reviewed  POC COVID19/FLU A&B COMBO    EKG   Radiology No results found.  Procedures Procedures (including critical care time)  Medications Ordered in UC Medications - No data to display  Initial Impression / Assessment and Plan / UC Course  I have reviewed the triage vital signs and the nursing notes.  Pertinent labs & imaging results that were available during my care of the patient were reviewed by me  and considered in my medical decision making (see chart for details).    Final Clinical Impressions(s) / UC Diagnoses   Final diagnoses:  Viral URI     Discharge Instructions      You been diagnosed with a viral illness today. -Viruses have to run their course and medicines that are prescribed are meant to help with symptoms. - With viruses usually feel poorly from 3 to 7 days with cough being the last symptoms to resolve.  -Cough can linger from days to weeks.  Antibiotics are not effective for  viruses. -If your cough lasts more than 2 weeks and you are coughing so hard that you are vomiting or feel like you could pass out we need to follow-up with PCP for further testing and evaluation. -Rest, increase water intake, may use pseudoephedrine for nasal congestion, Delsym (dextromethorphan) or honey as needed for cough, and ibuprofen and/or Tylenol  as directed on packaging for pain and fever. -If you have hypertension you should take Coricidin or other OTC meds approved for people with high blood pressure. -You may use a spoonful of honey every 4-6 hours as needed for throat pain and cough. -Warm tea with honey and lemon are helpful for soothe throat as well.  Chloraseptic and Cepacol make a throat lozenge with numbing medication, can be purchased over-the-counter. -May also use Flonase  or sinus rinse for sinus pressure or nasal congestion.  Be sure to use distilled bottled water for sinus rinses. -May use coolmist humidifier to open up nasal passages -May elevate head to assist with postnasal drainage. -If you feel poorly (fever, fatigue, shortness of breath, nausea, etc.) for more than 10 days to be sure to follow-up with PCP or in clinic for further evaluation and additional treatments. If you experience chest pain with shortness of breath or pulse oxygen less than 95% you should report to the ER.     ED Prescriptions   None    PDMP not reviewed this encounter.   Andra Corean BROCKS, PA-C 11/25/24 1459

## 2024-12-03 ENCOUNTER — Encounter: Payer: Self-pay | Admitting: Internal Medicine

## 2024-12-04 NOTE — Telephone Encounter (Signed)
 Please ask pt to make appt to assess for need for antibacterial antibiotic .   Thanks

## 2024-12-05 ENCOUNTER — Ambulatory Visit: Payer: Self-pay | Admitting: Internal Medicine

## 2024-12-05 ENCOUNTER — Encounter: Payer: Self-pay | Admitting: Internal Medicine

## 2024-12-05 ENCOUNTER — Ambulatory Visit: Admitting: Internal Medicine

## 2024-12-05 ENCOUNTER — Emergency Department (HOSPITAL_COMMUNITY)

## 2024-12-05 ENCOUNTER — Encounter (HOSPITAL_COMMUNITY): Payer: Self-pay

## 2024-12-05 ENCOUNTER — Ambulatory Visit (INDEPENDENT_AMBULATORY_CARE_PROVIDER_SITE_OTHER)

## 2024-12-05 ENCOUNTER — Inpatient Hospital Stay (HOSPITAL_COMMUNITY)
Admission: EM | Admit: 2024-12-05 | Discharge: 2024-12-07 | Disposition: A | Discharge status: Home / Self Care | Source: Ambulatory Visit | Attending: Student | Admitting: Student

## 2024-12-05 ENCOUNTER — Other Ambulatory Visit: Payer: Self-pay

## 2024-12-05 ENCOUNTER — Telehealth: Payer: Self-pay

## 2024-12-05 ENCOUNTER — Ambulatory Visit

## 2024-12-05 VITALS — BP 100/72 | HR 80 | Temp 99.4°F | Ht 63.0 in | Wt 163.0 lb

## 2024-12-05 DIAGNOSIS — R509 Fever, unspecified: Secondary | ICD-10-CM | POA: Diagnosis not present

## 2024-12-05 DIAGNOSIS — Z8249 Family history of ischemic heart disease and other diseases of the circulatory system: Secondary | ICD-10-CM | POA: Diagnosis not present

## 2024-12-05 DIAGNOSIS — Z8262 Family history of osteoporosis: Secondary | ICD-10-CM | POA: Diagnosis not present

## 2024-12-05 DIAGNOSIS — F419 Anxiety disorder, unspecified: Secondary | ICD-10-CM | POA: Diagnosis present

## 2024-12-05 DIAGNOSIS — N12 Tubulo-interstitial nephritis, not specified as acute or chronic: Secondary | ICD-10-CM | POA: Diagnosis present

## 2024-12-05 DIAGNOSIS — E7800 Pure hypercholesterolemia, unspecified: Secondary | ICD-10-CM | POA: Diagnosis not present

## 2024-12-05 DIAGNOSIS — N1831 Chronic kidney disease, stage 3a: Secondary | ICD-10-CM

## 2024-12-05 DIAGNOSIS — E785 Hyperlipidemia, unspecified: Secondary | ICD-10-CM | POA: Diagnosis present

## 2024-12-05 DIAGNOSIS — G8929 Other chronic pain: Secondary | ICD-10-CM | POA: Diagnosis present

## 2024-12-05 DIAGNOSIS — Z7983 Long term (current) use of bisphosphonates: Secondary | ICD-10-CM | POA: Diagnosis not present

## 2024-12-05 DIAGNOSIS — R7989 Other specified abnormal findings of blood chemistry: Secondary | ICD-10-CM | POA: Diagnosis present

## 2024-12-05 DIAGNOSIS — Z87891 Personal history of nicotine dependence: Secondary | ICD-10-CM | POA: Diagnosis not present

## 2024-12-05 DIAGNOSIS — Z860100 Personal history of colon polyps, unspecified: Secondary | ICD-10-CM | POA: Diagnosis not present

## 2024-12-05 DIAGNOSIS — I129 Hypertensive chronic kidney disease with stage 1 through stage 4 chronic kidney disease, or unspecified chronic kidney disease: Secondary | ICD-10-CM | POA: Diagnosis present

## 2024-12-05 DIAGNOSIS — Z800 Family history of malignant neoplasm of digestive organs: Secondary | ICD-10-CM | POA: Diagnosis not present

## 2024-12-05 DIAGNOSIS — A4151 Sepsis due to Escherichia coli [E. coli]: Secondary | ICD-10-CM | POA: Diagnosis present

## 2024-12-05 DIAGNOSIS — J439 Emphysema, unspecified: Secondary | ICD-10-CM | POA: Diagnosis present

## 2024-12-05 DIAGNOSIS — R002 Palpitations: Secondary | ICD-10-CM

## 2024-12-05 DIAGNOSIS — I7300 Raynaud's syndrome without gangrene: Secondary | ICD-10-CM | POA: Diagnosis present

## 2024-12-05 DIAGNOSIS — D631 Anemia in chronic kidney disease: Secondary | ICD-10-CM | POA: Diagnosis present

## 2024-12-05 DIAGNOSIS — M810 Age-related osteoporosis without current pathological fracture: Secondary | ICD-10-CM | POA: Diagnosis present

## 2024-12-05 DIAGNOSIS — F32A Depression, unspecified: Secondary | ICD-10-CM | POA: Diagnosis present

## 2024-12-05 DIAGNOSIS — I700 Atherosclerosis of aorta: Secondary | ICD-10-CM

## 2024-12-05 DIAGNOSIS — N390 Urinary tract infection, site not specified: Secondary | ICD-10-CM | POA: Diagnosis not present

## 2024-12-05 DIAGNOSIS — N183 Chronic kidney disease, stage 3 unspecified: Secondary | ICD-10-CM | POA: Diagnosis present

## 2024-12-05 DIAGNOSIS — A4150 Gram-negative sepsis, unspecified: Secondary | ICD-10-CM | POA: Diagnosis not present

## 2024-12-05 DIAGNOSIS — Z7982 Long term (current) use of aspirin: Secondary | ICD-10-CM | POA: Diagnosis not present

## 2024-12-05 DIAGNOSIS — Z79818 Long term (current) use of other agents affecting estrogen receptors and estrogen levels: Secondary | ICD-10-CM | POA: Diagnosis not present

## 2024-12-05 DIAGNOSIS — Z79899 Other long term (current) drug therapy: Secondary | ICD-10-CM | POA: Diagnosis not present

## 2024-12-05 DIAGNOSIS — F411 Generalized anxiety disorder: Secondary | ICD-10-CM | POA: Diagnosis not present

## 2024-12-05 DIAGNOSIS — R6510 Systemic inflammatory response syndrome (SIRS) of non-infectious origin without acute organ dysfunction: Secondary | ICD-10-CM

## 2024-12-05 DIAGNOSIS — M549 Dorsalgia, unspecified: Secondary | ICD-10-CM | POA: Diagnosis present

## 2024-12-05 DIAGNOSIS — N10 Acute tubulo-interstitial nephritis: Secondary | ICD-10-CM | POA: Diagnosis present

## 2024-12-05 DIAGNOSIS — I10 Essential (primary) hypertension: Secondary | ICD-10-CM

## 2024-12-05 DIAGNOSIS — J438 Other emphysema: Secondary | ICD-10-CM | POA: Diagnosis not present

## 2024-12-05 DIAGNOSIS — R739 Hyperglycemia, unspecified: Secondary | ICD-10-CM | POA: Diagnosis not present

## 2024-12-05 DIAGNOSIS — G4700 Insomnia, unspecified: Secondary | ICD-10-CM | POA: Diagnosis present

## 2024-12-05 LAB — BASIC METABOLIC PANEL WITH GFR
BUN: 17 mg/dL (ref 6–23)
CO2: 26 meq/L (ref 19–32)
Calcium: 9.1 mg/dL (ref 8.4–10.5)
Chloride: 103 meq/L (ref 96–112)
Creatinine, Ser: 0.95 mg/dL (ref 0.40–1.20)
GFR: 62.56 mL/min (ref >60.00)
Glucose, Bld: 110 mg/dL — ABNORMAL HIGH (ref 70–99)
Potassium: 4.2 meq/L (ref 3.5–5.1)
Sodium: 137 meq/L (ref 135–145)

## 2024-12-05 LAB — HEPATIC FUNCTION PANEL
ALT: 69 U/L — ABNORMAL HIGH (ref 0–35)
AST: 26 U/L (ref 0–37)
Albumin: 3.6 g/dL (ref 3.5–5.2)
Alkaline Phosphatase: 156 U/L — ABNORMAL HIGH (ref 39–117)
Bilirubin, Direct: 0.1 mg/dL (ref 0.0–0.3)
Total Bilirubin: 0.6 mg/dL (ref 0.2–1.2)
Total Protein: 7.3 g/dL (ref 6.0–8.3)

## 2024-12-05 LAB — COMPREHENSIVE METABOLIC PANEL WITH GFR
ALT: 91 U/L — ABNORMAL HIGH (ref 0–44)
AST: 40 U/L (ref 15–41)
Albumin: 3.6 g/dL (ref 3.5–5.0)
Alkaline Phosphatase: 190 U/L — ABNORMAL HIGH (ref 38–126)
Anion gap: 12 (ref 5–15)
BUN: 18 mg/dL (ref 8–23)
CO2: 21 mmol/L — ABNORMAL LOW (ref 22–32)
Calcium: 9.2 mg/dL (ref 8.9–10.3)
Chloride: 101 mmol/L (ref 98–111)
Creatinine, Ser: 0.97 mg/dL (ref 0.44–1.00)
GFR, Estimated: >60 mL/min (ref >60)
Glucose, Bld: 109 mg/dL — ABNORMAL HIGH (ref 70–99)
Potassium: 3.9 mmol/L (ref 3.5–5.1)
Sodium: 135 mmol/L (ref 135–145)
Total Bilirubin: 0.6 mg/dL (ref 0.0–1.2)
Total Protein: 7.5 g/dL (ref 6.5–8.1)

## 2024-12-05 LAB — CBC WITH DIFFERENTIAL/PLATELET
Abs Immature Granulocytes: 0.18 K/uL — ABNORMAL HIGH (ref 0.00–0.07)
Basophils Absolute: 0 K/uL (ref 0.0–0.1)
Basophils Absolute: 0 K/uL (ref 0.0–0.1)
Basophils Relative: 0.1 % (ref 0.0–3.0)
Basophils Relative: 0 %
Eosinophils Absolute: 0 K/uL (ref 0.0–0.7)
Eosinophils Absolute: 0 K/uL (ref 0.0–0.5)
Eosinophils Relative: 0.2 % (ref 0.0–5.0)
Eosinophils Relative: 0 %
HCT: 33.7 % — ABNORMAL LOW (ref 36.0–46.0)
HCT: 34 % — ABNORMAL LOW (ref 36.0–46.0)
Hemoglobin: 11 g/dL — ABNORMAL LOW (ref 12.0–15.0)
Hemoglobin: 11 g/dL — ABNORMAL LOW (ref 12.0–15.0)
Immature Granulocytes: 1 %
Lymphocytes Relative: 10.4 % — ABNORMAL LOW (ref 12.0–46.0)
Lymphocytes Relative: 8 %
Lymphs Abs: 2.2 K/uL (ref 0.7–4.0)
Lymphs Abs: 1.6 K/uL (ref 0.7–4.0)
MCH: 27.8 pg (ref 26.0–34.0)
MCHC: 32.5 g/dL (ref 30.0–36.0)
MCHC: 32.4 g/dL (ref 30.0–36.0)
MCV: 83.6 fl (ref 78.0–100.0)
MCV: 85.9 fL (ref 80.0–100.0)
Monocytes Absolute: 1.1 K/uL — ABNORMAL HIGH (ref 0.1–1.0)
Monocytes Absolute: 0.7 K/uL (ref 0.1–1.0)
Monocytes Relative: 5 % (ref 3.0–12.0)
Monocytes Relative: 3 %
Neutro Abs: 18 K/uL — ABNORMAL HIGH (ref 1.4–7.7)
Neutro Abs: 18.6 K/uL — ABNORMAL HIGH (ref 1.7–7.7)
Neutrophils Relative %: 84.3 % — ABNORMAL HIGH (ref 43.0–77.0)
Neutrophils Relative %: 88 %
Platelets: 570 K/uL — ABNORMAL HIGH (ref 150.0–400.0)
Platelets: 564 K/uL — ABNORMAL HIGH (ref 150–400)
RBC: 4.03 Mil/uL (ref 3.87–5.11)
RBC: 3.96 MIL/uL (ref 3.87–5.11)
RDW: 14.6 % (ref 11.5–15.5)
RDW: 14.7 % (ref 11.5–15.5)
WBC: 21.3 K/uL (ref 4.0–10.5)
WBC: 21.2 K/uL — ABNORMAL HIGH (ref 4.0–10.5)
nRBC: 0 % (ref 0.0–0.2)

## 2024-12-05 LAB — PROTIME-INR
INR: 1.2 (ref 0.8–1.2)
Prothrombin Time: 15.5 s — ABNORMAL HIGH (ref 11.4–15.2)

## 2024-12-05 LAB — URINALYSIS, W/ REFLEX TO CULTURE (INFECTION SUSPECTED)
Bilirubin Urine: NEGATIVE
Glucose, UA: NEGATIVE mg/dL
Hgb urine dipstick: NEGATIVE
Ketones, ur: NEGATIVE mg/dL
Nitrite: NEGATIVE
Protein, ur: 30 mg/dL — AB
Specific Gravity, Urine: 1.017 (ref 1.005–1.030)
pH: 5 (ref 5.0–8.0)

## 2024-12-05 LAB — I-STAT CG4 LACTIC ACID, ED
Lactic Acid, Venous: 0.7 mmol/L (ref 0.5–1.9)
Lactic Acid, Venous: 0.6 mmol/L (ref 0.5–1.9)

## 2024-12-05 LAB — SEDIMENTATION RATE: Sed Rate: 55 mm/h — ABNORMAL HIGH (ref 0–30)

## 2024-12-05 MED ORDER — ACETAMINOPHEN 650 MG RE SUPP: 650.0000 mg | Freq: Four times a day (QID) | RECTAL | Status: DC | PRN

## 2024-12-05 MED ORDER — KETOROLAC TROMETHAMINE 15 MG/ML IJ SOLN
15.0000 mg | Freq: Once | INTRAMUSCULAR | Status: AC
  Administered 2024-12-05: 15 mg via INTRAVENOUS
  Filled 2024-12-05: qty 1

## 2024-12-05 MED ORDER — KETOROLAC TROMETHAMINE 15 MG/ML IJ SOLN
15.0000 mg | Freq: Four times a day (QID) | INTRAMUSCULAR | Status: DC | PRN
  Administered 2024-12-06: 15 mg via INTRAVENOUS
  Filled 2024-12-05: qty 1

## 2024-12-05 MED ORDER — IOHEXOL 300 MG/ML  SOLN
100.0000 mL | Freq: Once | INTRAMUSCULAR | Status: AC | PRN
  Administered 2024-12-05: 100 mL via INTRAVENOUS

## 2024-12-05 MED ORDER — ACETAMINOPHEN 325 MG PO TABS
650.0000 mg | ORAL_TABLET | Freq: Four times a day (QID) | ORAL | Status: DC | PRN
  Administered 2024-12-06 – 2024-12-07 (×4): 650 mg via ORAL
  Filled 2024-12-05 (×4): qty 2

## 2024-12-05 MED ORDER — LACTATED RINGERS IV SOLN
150.0000 mL/h | INTRAVENOUS | Status: AC
  Administered 2024-12-05: 150 mL/h via INTRAVENOUS

## 2024-12-05 MED ORDER — LACTATED RINGERS IV SOLN: INTRAVENOUS | Status: DC

## 2024-12-05 MED ORDER — METOCLOPRAMIDE HCL 5 MG/ML IJ SOLN
10.0000 mg | Freq: Once | INTRAMUSCULAR | Status: AC
  Administered 2024-12-05: 10 mg via INTRAVENOUS
  Filled 2024-12-05: qty 2

## 2024-12-05 MED ORDER — LACTATED RINGERS IV BOLUS
1000.0000 mL | Freq: Once | INTRAVENOUS | Status: AC
  Administered 2024-12-05: 1000 mL via INTRAVENOUS

## 2024-12-05 MED ORDER — LEVOFLOXACIN 500 MG PO TABS: 500.0000 mg | ORAL_TABLET | Freq: Every day | ORAL | 0 refills | Status: DC

## 2024-12-05 MED ORDER — ONDANSETRON HCL 4 MG/2ML IJ SOLN: 4.0000 mg | Freq: Four times a day (QID) | INTRAMUSCULAR | Status: DC | PRN

## 2024-12-05 MED ORDER — SODIUM CHLORIDE 0.9 % IV SOLN
2.0000 g | INTRAVENOUS | Status: DC
  Administered 2024-12-06: 2 g via INTRAVENOUS
  Filled 2024-12-05: qty 20

## 2024-12-05 MED ORDER — ONDANSETRON HCL 4 MG PO TABS: 4.0000 mg | ORAL_TABLET | Freq: Four times a day (QID) | ORAL | Status: DC | PRN

## 2024-12-05 MED ORDER — SODIUM CHLORIDE 0.9 % IV SOLN
2.0000 g | Freq: Once | INTRAVENOUS | Status: AC
  Administered 2024-12-05: 2 g via INTRAVENOUS
  Filled 2024-12-05: qty 20

## 2024-12-05 MED ORDER — ENOXAPARIN SODIUM 40 MG/0.4ML IJ SOSY
40.0000 mg | PREFILLED_SYRINGE | INTRAMUSCULAR | Status: DC
  Administered 2024-12-06 – 2024-12-07 (×2): 40 mg via SUBCUTANEOUS
  Filled 2024-12-05 (×2): qty 0.4

## 2024-12-05 MED ORDER — DIPHENHYDRAMINE HCL 50 MG/ML IJ SOLN
25.0000 mg | Freq: Once | INTRAMUSCULAR | Status: AC
  Administered 2024-12-05: 25 mg via INTRAVENOUS
  Filled 2024-12-05: qty 1

## 2024-12-05 NOTE — Progress Notes (Signed)
 Patient ID: Annette [name], female   DOB: Aug 01, 1956, 68 y.o.   MRN: [id number]        Chief Complaint: follow up febrile illness x 3 wks       HPI:  Annette [name] is a 68 y.o. female here with c/o persistent fever over 101 today, with feeling ill, chills, HA, slight throat discomfort, some nasal congestion, and more recently mild mid epigastric discomfort, nausea, loose stools, low energy and less po intake overall.  Pt denies other specific symptoms such as sinus pain, ear pain, cough, CP, sob or rash.  Has been seen  in ED nov 30 with negative covid, flu, rsv testing, no other specific site of infection.  Has not had cxr so far.  Last cbc x 10 days ago with mild elevated wbc 11.1       Wt Readings from Last 3 Encounters:  12/05/24 163 lb (73.9 kg)  12/05/24 163 lb (73.9 kg)  11/25/24 165 lb (74.8 kg)   BP Readings from Last 3 Encounters:  12/05/24 120/66  12/05/24 100/72  11/25/24 115/66         Past Medical History:  Diagnosis Date   ALLERGIC RHINITIS 10/01/2007   ANXIETY 11/10/2010   ATTENTION DEFICIT DISORDER 11/10/2010   BARTHOLIN'S CYST 08/20/2007   Carpal tunnel syndrome 08/20/2007   COLONIC POLYPS, HX OF 08/20/2007   DEPRESSION 08/20/2007   EMPHYSEMA 06/16/2010   HOT FLASHES 09/26/2009   HYPERLIPIDEMIA 08/20/2007   HYPERTENSION 08/20/2007   Insomnia, unspecified 10/01/2007   ISCHEMIC COLITIS, HX OF 10/01/2007   MENOPAUSAL DISORDER 11/07/2009   OBESITY, MILD 08/20/2007   OSTEOPENIA 06/16/2010   POLYARTHRALGIA 11/06/2008   Raynaud's syndrome 08/20/2007   Past Surgical History:  Procedure Laterality Date   TONSILLECTOMY      reports that she quit smoking about 33 years ago. Her smoking use included cigarettes. She has never used smokeless tobacco. She reports current alcohol use of about 1.0 standard drink of alcohol per week. She reports that she does not use drugs. family history includes ADD / ADHD in her daughter; Breast cancer in her maternal grandmother and another family  member; Breast cancer (age of onset: 59) in her niece; Coronary artery disease in an other family member; Hypertension in an other family member; Osteoporosis in her maternal grandmother and mother; Pancreatic cancer in her father; Prostate cancer in an other family member. No Known Allergies Current Outpatient Medications on File Prior to Visit  Medication Sig Dispense Refill   metroNIDAZOLE  (FLAGYL ) 500 MG tablet Take 500 mg by mouth 2 (two) times daily.     albuterol  (VENTOLIN  HFA) 108 (90 Base) MCG/ACT inhaler Inhale 2 puffs into the lungs every 6 (six) hours as needed for wheezing or shortness of breath. 3 each 3   alendronate  (FOSAMAX ) 70 MG tablet Take 1 tablet (70 mg total) by mouth every 7 (seven) days. Take with a full glass of water on an empty stomach. 12 tablet 3   amLODipine  (NORVASC ) 2.5 MG tablet Take 1 tablet (2.5 mg total) by mouth daily. (Patient taking differently: Take 2.5 mg by mouth daily.) 90 tablet 3   aspirin  EC 81 MG tablet Take 1 tablet (81 mg total) by mouth daily. Swallow whole. 90 tablet 3   Estradiol  (VAGIFEM ) 10 MCG TABS vaginal tablet Place 1 tablet (10 mcg total) vaginally 2 (two) times a week. 24 tablet 3   fluticasone  (FLONASE ) 50 MCG/ACT nasal spray Place 2 sprays into both nostrils 2 (two) times  daily as needed (Nasal congestion and drainage). 9.9 mL 0   gabapentin  (NEURONTIN ) 300 MG capsule Take 1 capsule (300 mg total) by mouth 3 (three) times daily as needed. 90 capsule 5   rosuvastatin  (CRESTOR ) 20 MG tablet Take 1 tablet (20 mg total) by mouth daily. 90 tablet 3   traMADol  (ULTRAM ) 50 MG tablet Take 1 tablet (50 mg total) by mouth every 6 (six) hours as needed. 30 tablet 0   zolpidem  (AMBIEN ) 10 MG tablet Take 1 tablet (10 mg total) by mouth at bedtime as needed. 90 tablet 1   No current facility-administered medications on file prior to visit.        ROS:  All others reviewed and negative.  Objective        PE:  BP 100/72 (BP Location: Right Arm,  Patient Position: Sitting, Cuff Size: Normal)   Pulse 80   Temp 99.4 F (37.4 C) (Oral)   Ht 5' 3 (1.6 m)   Wt 163 lb (73.9 kg)   SpO2 99%   BMI 28.87 kg/m                 Constitutional: Pt appears moderately ill, non toxic               HENT: Head: NCAT.                Right Ear: External ear normal.                 Left Ear: External ear normal.                Eyes: . Pupils are equal, round, and reactive to light. Conjunctivae and EOM are normal               Nose: without d/c or deformity               Neck: Neck supple. Gross normal ROM               Cardiovascular: Normal rate and regular rhythm.                 Pulmonary/Chest: Effort normal and breath sounds without rales or wheezing.                Abd:  Soft, mild epigastric tender, ND, + BS, no organomegaly               Neurological: Pt is alert. At baseline orientation, motor grossly intact               Skin: Skin is warm. No rashes, no other new lesions, LE edema - none               Psychiatric: Pt behavior is normal without agitation   Micro: none  Cardiac tracings I have personally interpreted today:  none  Pertinent Radiological findings (summarize): none   Lab Results  Component Value Date   WBC 21.2 (H) 12/05/2024   HGB 11.0 (L) 12/05/2024   HCT 34.0 (L) 12/05/2024   PLT 564 (H) 12/05/2024   GLUCOSE 109 (H) 12/05/2024   CHOL 142 02/02/2024   TRIG 100.0 02/02/2024   HDL 47.00 02/02/2024   LDLDIRECT 137.0 12/11/2013   LDLCALC 75 02/02/2024   ALT 91 (H) 12/05/2024   AST 40 12/05/2024   NA 135 12/05/2024   K 3.9 12/05/2024   CL 101 12/05/2024   CREATININE 0.97 12/05/2024   BUN 18 12/05/2024   CO2  21 (L) 12/05/2024   TSH 2.62 02/02/2024   HGBA1C 6.1 02/02/2024   Assessment/Plan:  Annette [name] is a 68 y.o. Black or African American [2] female with  has a past medical history of ALLERGIC RHINITIS (10/01/2007), ANXIETY (11/10/2010), ATTENTION DEFICIT DISORDER (11/10/2010), BARTHOLIN'S CYST  (08/20/2007), Carpal tunnel syndrome (08/20/2007), COLONIC POLYPS, HX OF (08/20/2007), DEPRESSION (08/20/2007), EMPHYSEMA (06/16/2010), HOT FLASHES (09/26/2009), HYPERLIPIDEMIA (08/20/2007), HYPERTENSION (08/20/2007), Insomnia, unspecified (10/01/2007), ISCHEMIC COLITIS, HX OF (10/01/2007), MENOPAUSAL DISORDER (11/07/2009), OBESITY, MILD (08/20/2007), OSTEOPENIA (06/16/2010), POLYARTHRALGIA (11/06/2008), and Raynaud's syndrome (08/20/2007).  Febrile illness Pt at least mod ill with chills but no specific site of infection.  3 wks course with worsening suggests bacterial infection.  Pt for cbc, bmp, urine and culture, as well as blood culture,  cxr.  Also empiric levaquin  500 every day course, but if culture is abnormal or has further elevated wbc, should consider ED eval tonight.  Can't r/o Endocarditis.    EMPHYSEMA No wheezing, o/w stable   Stage 3a chronic kidney disease (HCC) Lab Results  Component Value Date   CREATININE 0.97 12/05/2024   Stable overall, cont to avoid nephrotoxins  [id number]   Hyperglycemia Lab Results  Component Value Date   HGBA1C 6.1 02/02/2024   Stable, pt to continue current medical treatment  - diet,wt control   Essential hypertension BP Readings from Last 3 Encounters:  12/05/24 120/66  12/05/24 100/72  11/25/24 115/66   Stable, pt to continue medical treatment norvasc  2.5 mg qd  Followup: Return if symptoms worsen or fail to improve.  Lynwood Rush, MD 12/05/2024 7:12 PM [facility]

## 2024-12-05 NOTE — Assessment & Plan Note (Signed)
 No wheezing, o/w stable

## 2024-12-05 NOTE — ED Triage Notes (Signed)
 Patient has had a fever since thanksgiving. Has tried over the counter medication. Still feels very weak. No appetite. Feels nauseous and has diarrhea.

## 2024-12-05 NOTE — H&P (Signed)
 History and Physical    Patient: Annette [name] FMW:[id number] DOB: Aug 10, 1956 DOA: 12/05/2024 DOS: the patient was seen and examined on 12/05/2024 PCP: Norleen Lynwood ORN, MD  Patient coming from: Home  Chief Complaint:  Chief Complaint  Patient presents with   Fever   HPI: Annette [name] is a 68 y.o. female with medical history significant of osteoporosis, essential hypertension, hyperlipidemia, polyarthralgia, Raynaud's phenomenon, who has had recurrent UTIs in the past.  Patient presented after going to her PCPs office complaining of febrile illness for about 3 weeks.  Patient also has had dysuria.  Slight headache and sore throat.  Some nasal congestion.  She is also having more energy and loose stools with poor oral intake.  Patient was sent to the ER for evaluation.  She denied any sick contact.  In the ER patient was found to meet sepsis criteria with temperature 103.1 and leukocytosis workup more than 20,000.  Evidence of UTI from urinalysis and CT abdomen pelvis showing left pyelonephritis.  At this point she has been admitted with acute pyelonephritis and sepsis due to pyelonephritis.  Patient will be admitted for further evaluation and treatment.  Review of Systems: As mentioned in the history of present illness. All other systems reviewed and are negative. Past Medical History:  Diagnosis Date   ALLERGIC RHINITIS 10/01/2007   ANXIETY 11/10/2010   ATTENTION DEFICIT DISORDER 11/10/2010   BARTHOLIN'S CYST 08/20/2007   Carpal tunnel syndrome 08/20/2007   COLONIC POLYPS, HX OF 08/20/2007   DEPRESSION 08/20/2007   EMPHYSEMA 06/16/2010   HOT FLASHES 09/26/2009   HYPERLIPIDEMIA 08/20/2007   HYPERTENSION 08/20/2007   Insomnia, unspecified 10/01/2007   ISCHEMIC COLITIS, HX OF 10/01/2007   MENOPAUSAL DISORDER 11/07/2009   OBESITY, MILD 08/20/2007   OSTEOPENIA 06/16/2010   POLYARTHRALGIA 11/06/2008   Raynaud's syndrome 08/20/2007   Past Surgical History:  Procedure Laterality Date    TONSILLECTOMY     Social History:  reports that she quit smoking about 33 years ago. Her smoking use included cigarettes. She has never used smokeless tobacco. She reports current alcohol use of about 1.0 standard drink of alcohol per week. She reports that she does not use drugs.  No Known Allergies  Family History  Problem Relation Age of Onset   Osteoporosis Mother    Pancreatic cancer Father        dx. 36s   Osteoporosis Maternal Grandmother    Breast cancer Maternal Grandmother        dx. 48s   ADD / ADHD Daughter    Coronary artery disease Other    Hypertension Other    Prostate cancer Other    Breast cancer Other        dx. 49s   Breast cancer Niece 33   Colon cancer Neg Hx     Prior to Admission medications   Medication Sig Start Date End Date Taking? Authorizing Provider  albuterol  (VENTOLIN  HFA) 108 (90 Base) MCG/ACT inhaler Inhale 2 puffs into the lungs every 6 (six) hours as needed for wheezing or shortness of breath. 07/18/24  Yes Norleen Lynwood ORN, MD  alendronate  (FOSAMAX ) 70 MG tablet Take 1 tablet (70 mg total) by mouth every 7 (seven) days. Take with a full glass of water on an empty stomach. 09/04/24  Yes Norleen Lynwood ORN, MD  aspirin  EC 81 MG tablet Take 1 tablet (81 mg total) by mouth daily. Swallow whole. 02/05/22  Yes Thukkani, Arun K, MD  gabapentin  (NEURONTIN ) 300 MG capsule Take 1  capsule (300 mg total) by mouth 3 (three) times daily as needed. 07/18/24  Yes Norleen Lynwood ORN, MD  rosuvastatin  (CRESTOR ) 20 MG tablet Take 1 tablet (20 mg total) by mouth daily. 02/07/24  Yes Norleen Lynwood ORN, MD  zolpidem  (AMBIEN ) 10 MG tablet Take 1 tablet (10 mg total) by mouth at bedtime as needed. 02/07/24  Yes Norleen Lynwood ORN, MD  amLODipine  (NORVASC ) 2.5 MG tablet Take 1 tablet (2.5 mg total) by mouth daily. Patient taking differently: Take 2.5 mg by mouth daily. 02/07/24   Norleen Lynwood ORN, MD  Estradiol  (VAGIFEM ) 10 MCG TABS vaginal tablet Place 1 tablet (10 mcg total) vaginally 2 (two) times a  week. 02/04/22   Norleen Lynwood ORN, MD  fluticasone  (FLONASE ) 50 MCG/ACT nasal spray Place 2 sprays into both nostrils 2 (two) times daily as needed (Nasal congestion and drainage). 02/23/24   Arloa Suzen RAMAN, NP  levofloxacin  (LEVAQUIN ) 500 MG tablet Take 1 tablet (500 mg total) by mouth daily. 12/05/24   Norleen Lynwood ORN, MD  metroNIDAZOLE  (FLAGYL ) 500 MG tablet Take 500 mg by mouth 2 (two) times daily. 11/15/24   [provider]  traMADol  (ULTRAM ) 50 MG tablet Take 1 tablet (50 mg total) by mouth every 6 (six) hours as needed. 07/18/24   Norleen Lynwood ORN, MD    Physical Exam: Vitals:   12/05/24 1842 12/05/24 1930 12/05/24 2000 12/05/24 2030  BP:  117/61 119/62 115/62  Pulse:  88 86 88  Resp:  18  18  Temp: (!) 102.9 F (39.4 C)     TempSrc: Oral     SpO2:  96% 96% 96%  Weight:      Height:       Constitutional: Acutely ill looking, weak,, calm, comfortable Eyes: PERRL, lids and conjunctivae normal ENMT: Mucous membranes are dry. Posterior pharynx clear of any exudate or lesions.Normal dentition.  Neck: normal, supple, no masses, no thyromegaly Respiratory: clear to auscultation bilaterally, no wheezing, no crackles. Normal respiratory effort. No accessory muscle use.  Cardiovascular: Sinus tachycardia, no murmurs / rubs / gallops. No extremity edema. 2+ pedal pulses. No carotid bruits.  Abdomen: no tenderness, no masses palpated. No hepatosplenomegaly. Bowel sounds positive.  Musculoskeletal: Good range of motion, no joint swelling or tenderness, Skin: no rashes, lesions, ulcers. No induration Neurologic: CN 2-12 grossly intact. Sensation intact, DTR normal. Strength 5/5 in all 4.  Psychiatric: Normal judgment and insight. Alert and oriented x 3. Normal mood  Data Reviewed:  Temperature 102.9, blood pressure 150/76, blood pressure 94, white count 21.2, hemoglobin 11 platelets 564, CO2 21, PT 15.5 urinalysis showed WBC 60 chest x-ray showed no acute findings CT head without contrast  showed no acute findings.  CT abdomen pelvis showed left pyelonephritis  Assessment and Plan:  #1 sepsis due to pyelonephritis: Patient will be admitted.  Urine and blood cultures obtained.  IV antibiotics.  Will follow culture results.  Continue supportive care.  #2 hyperlipidemia: Continue with statin  #3 chronic kidney disease stage III: Continue to monitor  #4 depression with anxiety: Continue home regimen  #5 essential hypertension: Continue with blood pressure monitoring  #6 osteoporosis: Resume home regimen at discharge.    Advance Care Planning:   Code Status: Not on file full code  Consults: None  Family Communication: Husband at bedside  Severity of Illness: The appropriate patient status for this patient is INPATIENT. Inpatient status is judged to be reasonable and necessary in order to provide the required intensity of service to  ensure the patient's safety. The patient's presenting symptoms, physical exam findings, and initial radiographic and laboratory data in the context of their chronic comorbidities is felt to place them at high risk for further clinical deterioration. Furthermore, it is not anticipated that the patient will be medically stable for discharge from the hospital within 2 midnights of admission.   * I certify that at the point of admission it is my clinical judgment that the patient will require inpatient hospital care spanning beyond 2 midnights from the point of admission due to high intensity of service, high risk for further deterioration and high frequency of surveillance required.*  AuthorBETHA SIM KNOLL, MD 12/05/2024 10:43 PM  For on call review [email or web address].

## 2024-12-05 NOTE — Patient Instructions (Signed)
 Please take all new medication as prescribed  - the antibiotic  Please continue all other medications as before, and refills have been done if requested.  Please have the pharmacy call with any other refills you may need.  Please keep your appointments with your specialists as you may have planned  Please go to the XRAY Department in the first floor for the x-ray testing  Please go to the LAB at the blood drawing area for the tests to be done, including the urine and blood cultures  If the blood culture is abnormal, we may need to direct you back to the ED.

## 2024-12-05 NOTE — Telephone Encounter (Signed)
 Called and unable to reach Pt , I did reach her husband and informed him to have the Patient to go to the ED now. Pt husband states understanding.

## 2024-12-05 NOTE — Assessment & Plan Note (Signed)
 Pt at least mod ill with chills but no specific site of infection.  3 wks course with worsening suggests bacterial infection.  Pt for cbc, bmp, urine and culture, as well as blood culture,  cxr.  Also empiric levaquin  500 every day course, but if culture is abnormal or has further elevated wbc, should consider ED eval tonight.  Can't r/o Endocarditis.

## 2024-12-05 NOTE — ED Provider Notes (Signed)
  EMERGENCY DEPARTMENT AT [facility] Provider Note   CSN: [id number] Arrival date & time: 12/05/24  1746     History  Chief Complaint  Patient presents with   Fever    Annette [name] is a 68 y.o. female with PMH as listed below who presents with fever since thanksgiving. Has tried over the counter medication. Still feels very weak. F/c, body aches. Last week Monday tested negative for covid/flu. Headache 7/10. No h/o similar headaches, occurring every day x 3 weeks. Last dose of tylenol  2:30 pm. +nausea, pulsating headaches, anorexia, sore throat, rhinorrhea, LLQ abdominal pain. Denies urinary sxs, cough. +diarrhea, 3-4 episodes per day. No blood that she has noticed. No recent travel, camping/hiking. Retired, doesn't work currently.   Seen in ED on 11/25/24 for fever w/ fatigue and sore throat. Diagnosed w/ viral illness.   Her husband at bedside states that their PCP called him to tell them that she needed to got o ED for high WBC count and to mention concern for endocarditis. Patient has no chest pain, SOB, h/o IVDU or artificial valve.   Past Medical History:  Diagnosis Date   ALLERGIC RHINITIS 10/01/2007   ANXIETY 11/10/2010   ATTENTION DEFICIT DISORDER 11/10/2010   BARTHOLIN'S CYST 08/20/2007   Carpal tunnel syndrome 08/20/2007   COLONIC POLYPS, HX OF 08/20/2007   DEPRESSION 08/20/2007   EMPHYSEMA 06/16/2010   HOT FLASHES 09/26/2009   HYPERLIPIDEMIA 08/20/2007   HYPERTENSION 08/20/2007   Insomnia, unspecified 10/01/2007   ISCHEMIC COLITIS, HX OF 10/01/2007   MENOPAUSAL DISORDER 11/07/2009   OBESITY, MILD 08/20/2007   OSTEOPENIA 06/16/2010   POLYARTHRALGIA 11/06/2008   Raynaud's syndrome 08/20/2007       Home Medications Prior to Admission medications   Medication Sig Start Date End Date Taking? Authorizing Provider  albuterol  (VENTOLIN  HFA) 108 (90 Base) MCG/ACT inhaler Inhale 2 puffs into the lungs every 6 (six) hours as needed for wheezing or shortness  of breath. 07/18/24   Norleen Lynwood ORN, MD  alendronate  (FOSAMAX ) 70 MG tablet Take 1 tablet (70 mg total) by mouth every 7 (seven) days. Take with a full glass of water on an empty stomach. 09/04/24   Norleen Lynwood ORN, MD  amLODipine  (NORVASC ) 2.5 MG tablet Take 1 tablet (2.5 mg total) by mouth daily. Patient taking differently: Take 2.5 mg by mouth daily. 02/07/24   Norleen Lynwood ORN, MD  aspirin  EC 81 MG tablet Take 1 tablet (81 mg total) by mouth daily. Swallow whole. 02/05/22   Thukkani, Arun K, MD  Estradiol  (VAGIFEM ) 10 MCG TABS vaginal tablet Place 1 tablet (10 mcg total) vaginally 2 (two) times a week. 02/04/22   Norleen Lynwood ORN, MD  fluticasone  (FLONASE ) 50 MCG/ACT nasal spray Place 2 sprays into both nostrils 2 (two) times daily as needed (Nasal congestion and drainage). 02/23/24   Arloa Suzen RAMAN, NP  gabapentin  (NEURONTIN ) 300 MG capsule Take 1 capsule (300 mg total) by mouth 3 (three) times daily as needed. 07/18/24   Norleen Lynwood ORN, MD  levofloxacin  (LEVAQUIN ) 500 MG tablet Take 1 tablet (500 mg total) by mouth daily. 12/05/24   Norleen Lynwood ORN, MD  metroNIDAZOLE  (FLAGYL ) 500 MG tablet Take 500 mg by mouth 2 (two) times daily. 11/15/24   [provider]  rosuvastatin  (CRESTOR ) 20 MG tablet Take 1 tablet (20 mg total) by mouth daily. 02/07/24   Norleen Lynwood ORN, MD  traMADol  (ULTRAM ) 50 MG tablet Take 1 tablet (50 mg total) by mouth every  6 (six) hours as needed. 07/18/24   Norleen Lynwood ORN, MD  zolpidem  (AMBIEN ) 10 MG tablet Take 1 tablet (10 mg total) by mouth at bedtime as needed. 02/07/24   Norleen Lynwood ORN, MD      Allergies    Patient has no known allergies.    Review of Systems   Review of Systems A 10 point review of systems was performed and is negative unless otherwise reported in HPI.  Physical Exam Updated Vital Signs BP 115/62   Pulse 88   Temp (!) 102.9 F (39.4 C) (Oral)   Resp 18   Ht 5' 3 (1.6 m)   Wt 73.9 kg   SpO2 96%   BMI 28.87 kg/m  Physical Exam General: Normal  appearing female, lying in bed.  HEENT: PERRLA, Sclera anicteric, MMM, trachea midline. Clear oropharynx. NCAT.  Cardiology: RRR, 2/6 systolic murmur.  Resp: Normal respiratory rate and effort. CTAB, no wheezes, rhonchi, crackles.  Abd: Soft, +epigastric TTP and diffuse lesser TTP, non-distended. No rebound tenderness or guarding.  GU: Deferred. MSK: No peripheral edema or signs of trauma. Extremities without deformity or TTP. No cyanosis or clubbing. Skin: warm, dry. No rashes or lesions. Back: +BL CVA tenderness Neuro: A&Ox4, CNs II-XII grossly intact. MAEs. Sensation grossly intact.  Psych: Normal mood and affect.   ED Results / Procedures / Treatments   Labs (all labs ordered are listed, but only abnormal results are displayed) Labs Reviewed  COMPREHENSIVE METABOLIC PANEL WITH GFR - Abnormal; Notable for the following components:      Result Value   CO2 21 (*)    Glucose, Bld 109 (*)    ALT 91 (*)    Alkaline Phosphatase 190 (*)    All other components within normal limits  CBC WITH DIFFERENTIAL/PLATELET - Abnormal; Notable for the following components:   WBC 21.2 (*)    Hemoglobin 11.0 (*)    HCT 34.0 (*)    Platelets 564 (*)    Neutro Abs 18.6 (*)    Abs Immature Granulocytes 0.18 (*)    All other components within normal limits  PROTIME-INR - Abnormal; Notable for the following components:   Prothrombin Time 15.5 (*)    All other components within normal limits  URINALYSIS, W/ REFLEX TO CULTURE (INFECTION SUSPECTED) - Abnormal; Notable for the following components:   Protein, ur 30 (*)    Leukocytes,Ua TRACE (*)    Bacteria, UA RARE (*)    All other components within normal limits  CULTURE, BLOOD (ROUTINE X 2)  CULTURE, BLOOD (ROUTINE X 2)  URINE CULTURE  I-STAT CG4 LACTIC ACID, ED  I-STAT CG4 LACTIC ACID, ED    EKG None  Radiology CT Head Wo Contrast Result Date: 12/05/2024 EXAM: CT HEAD WITHOUT 12/05/2024 08:52:19 PM TECHNIQUE: CT of the head was performed  without the administration of intravenous contrast. Automated exposure control, iterative reconstruction, and/or weight based adjustment of the mA/kV was utilized to reduce the radiation dose to as low as reasonably achievable. COMPARISON: None available. CLINICAL HISTORY: Headache, increasing frequency or severity FINDINGS: BRAIN AND VENTRICLES: No acute intracranial hemorrhage. No mass effect or midline shift. No extra-axial fluid collection. No evidence of acute infarct. No hydrocephalus. ORBITS: No acute abnormality. SINUSES AND MASTOIDS: No acute abnormality. SOFT TISSUES AND SKULL: No acute skull fracture. No acute soft tissue abnormality. IMPRESSION: 1. No acute intracranial abnormality. Electronically signed by: Pinkie Pebbles MD 12/05/2024 08:56 PM EST RP Workstation: [id number]   CT ABDOMEN PELVIS W CONTRAST  Result Date: 12/05/2024 EXAM: CT ABDOMEN AND PELVIS WITH CONTRAST 12/05/2024 08:52:19 PM TECHNIQUE: CT of the abdomen and pelvis was performed with the administration of 100 mL of iohexol  (OMNIPAQUE ) 300 MG/ML solution. Multiplanar reformatted images are provided for review. Automated exposure control, iterative reconstruction, and/or weight-based adjustment of the mA/kV was utilized to reduce the radiation dose to as low as reasonably achievable. COMPARISON: 03/04/2021. CLINICAL HISTORY: Sepsis; diarrhea, fever. FINDINGS: LOWER CHEST: Mild eventration of the hemidiaphragm with associated right basilar atelectasis. LIVER: The liver is unremarkable. GALLBLADDER AND BILE DUCTS: Gallbladder is unremarkable. No biliary ductal dilatation. SPLEEN: No acute abnormality. PANCREAS: No acute abnormality. ADRENAL GLANDS: No acute abnormality. KIDNEYS, URETERS AND BLADDER: Heterogeneous enhancement of the left kidney, better visualized on delayed imaging, suggesting pyelonephritis. No stones in the kidneys or ureters. No hydronephrosis. No perinephric or periureteral stranding. The bladder is unremarkable on  CT. GI AND BOWEL: Stomach demonstrates no acute abnormality. There is no bowel obstruction. Normal appendix (image 54). PERITONEUM AND RETROPERITONEUM: No ascites. No free air. VASCULATURE: Aorta is normal in caliber. LYMPH NODES: No lymphadenopathy. REPRODUCTIVE ORGANS: Suspected small uterine fibroids. BONES AND SOFT TISSUES: No acute osseous abnormality. No focal soft tissue abnormality. IMPRESSION: 1. Left pyelonephritis. Electronically signed by: Pinkie Pebbles MD 12/05/2024 08:55 PM EST RP Workstation: [id number]   DG Chest Port 1 View if patient is in a treatment room. Result Date: 12/05/2024 CLINICAL DATA:  Sepsis. EXAM: PORTABLE CHEST 1 VIEW COMPARISON:  CT dated 12/05/2024. FINDINGS: Minimal bibasilar atelectasis. No focal consolidation, pleural effusion, pneumothorax. The cardiac silhouette is within normal limits. No acute osseous pathology. IMPRESSION: No active disease. Electronically Signed   By: Vanetta Chou M.D.   On: 12/05/2024 19:20   DG Chest 2 View Result Date: 12/05/2024 EXAM: 2 VIEW(S) XRAY OF THE CHEST 12/05/2024 04:32:06 PM COMPARISON: 03/11/2024 CLINICAL HISTORY: persistent fever x 3 wks FINDINGS: LUNGS AND PLEURA: No focal pulmonary opacity. No pleural effusion. No pneumothorax. HEART AND MEDIASTINUM: No acute abnormality of the cardiac and mediastinal silhouettes. BONES AND SOFT TISSUES: No acute osseous abnormality. IMPRESSION: 1. No acute cardiopulmonary process. Electronically signed by: Lynwood Seip MD 12/05/2024 04:56 PM EST RP Workstation: [id number]    Procedures .Critical Care  Performed by: Franklyn Sid SAILOR, MD Authorized by: Franklyn Sid SAILOR, MD   Critical care provider statement:    Critical care time (minutes):  35   Critical care was necessary to treat or prevent imminent or life-threatening deterioration of the following conditions:  Sepsis   Critical care was time spent personally by me on the following activities:  Development of treatment plan with  patient or surrogate, discussions with consultants, evaluation of patient's response to treatment, examination of patient, ordering and review of laboratory studies, ordering and review of radiographic studies, ordering and performing treatments and interventions, pulse oximetry, re-evaluation of patient's condition, review of old charts and obtaining history from patient or surrogate   Care discussed with: admitting provider       Medications Ordered in ED Medications  cefTRIAXone  (ROCEPHIN ) 2 g in sodium chloride  0.9 % 100 mL IVPB (has no administration in time range)  lactated ringers  infusion (has no administration in time range)  ketorolac  (TORADOL ) 15 MG/ML injection 15 mg (has no administration in time range)  lactated ringers  bolus 1,000 mL (0 mLs Intravenous Stopped 12/05/24 1937)  metoCLOPramide  (REGLAN ) injection 10 mg (10 mg Intravenous Given 12/05/24 1838)  diphenhydrAMINE  (BENADRYL ) injection 25 mg (25 mg Intravenous Given 12/05/24 1838)  iohexol  (OMNIPAQUE ) 300 MG/ML  solution 100 mL (100 mLs Intravenous Contrast Given 12/05/24 2040)    ED Course/ Medical Decision Making/ A&P                          Medical Decision Making Amount and/or Complexity of Data Reviewed Labs: ordered. Decision-making details documented in ED Course. Radiology: ordered. Decision-making details documented in ED Course.  Risk Prescription drug management. Decision regarding hospitalization.    This patient presents to the ED for concern of fever, leukocytosis, this involves an extensive number of treatment options, and is a complaint that carries with it a high risk of complications and morbidity.  I considered the following differential and admission for this acute, potentially life threatening condition.  Febrile to 103.2F on arrival.  Heart rate 94.  Blood pressure 150/76.  MDM:    DDX for generalized weakness includes but is not limited to:  Infectious processes such as UTI or sepsis.   Patient and family are very concerned because the primary care provider mentioned a concern for high fever and high white count could be endocarditis.  Patient has no chest pain, no history of IV drug use, no artificial valve, no risk factors for endocarditis and will consider other more likely causes of infection first.  Lactic acid is normal however her WBC is 21.2 with left shift.  She is SIRS positive will give IV fluids and antibiotics.  Blood cultures and urine culture ordered.  No evidence of septic shock at this time.  She has no significant severe metabolic derangements or electrolyte abnormalities.  Also considered viral cause but respiratory panel is negative.  Chest x-ray with no enlarged cardiac silhouette or consolidation.  Clinical Course as of 12/05/24 2104  Wed Dec 05, 2024  1821 Lactic Acid, Venous: 0.7 [HN]  1900 WBC(!): 21.2 WBC 21.2, very elevated, w/ left shift [HN]  2023 Alkaline Phosphatase(!): 190 ?acute phase reactant [HN]  2023 ALT(!): 91 [HN]  2023 DG Chest Port 1 View if patient is in a treatment room. No active disease. [HN]  2059 CT ABDOMEN PELVIS W CONTRAST 1. Left pyelonephritis. [HN]  2101 Creatinine: 0.97 Will give IV toradol  for fever/pain [HN]  2101 W/ fever, leukocytosis, and +source of infection, she is +SIRS. Blood cultures already ordered. IV ceftriaxone  ordered. Urine culture ordered. Consulted to medicine for admission. [HN]    Clinical Course User Index [HN] Franklyn Sid SAILOR, MD    Labs: I Ordered, and personally interpreted labs.  The pertinent results include:  those listed above  Imaging Studies ordered: I ordered imaging studies including CTH, CT C/A/P, CXR I independently visualized and interpreted imaging. I agree with the radiologist interpretation  Additional history obtained from chart review, husband at bedside.   Cardiac Monitoring: The patient was maintained on a cardiac monitor.  I personally viewed and interpreted the cardiac  monitored which showed an underlying rhythm of: NSR  Reevaluation: After the interventions noted above, I reevaluated the patient and found that they have :improved  Social Determinants of Health: Lives independently  Disposition:  admit to medicine  Co morbidities that complicate the patient evaluation  Past Medical History:  Diagnosis Date   ALLERGIC RHINITIS 10/01/2007   ANXIETY 11/10/2010   ATTENTION DEFICIT DISORDER 11/10/2010   BARTHOLIN'S CYST 08/20/2007   Carpal tunnel syndrome 08/20/2007   COLONIC POLYPS, HX OF 08/20/2007   DEPRESSION 08/20/2007   EMPHYSEMA 06/16/2010   HOT FLASHES 09/26/2009   HYPERLIPIDEMIA 08/20/2007   HYPERTENSION 08/20/2007  Insomnia, unspecified 10/01/2007   ISCHEMIC COLITIS, HX OF 10/01/2007   MENOPAUSAL DISORDER 11/07/2009   OBESITY, MILD 08/20/2007   OSTEOPENIA 06/16/2010   POLYARTHRALGIA 11/06/2008   Raynaud's syndrome 08/20/2007     Medicines Meds ordered this encounter  Medications   lactated ringers  bolus 1,000 mL   metoCLOPramide  (REGLAN ) injection 10 mg   diphenhydrAMINE  (BENADRYL ) injection 25 mg   iohexol  (OMNIPAQUE ) 300 MG/ML solution 100 mL   cefTRIAXone  (ROCEPHIN ) 2 g in sodium chloride  0.9 % 100 mL IVPB    Antibiotic Indication::   UTI   lactated ringers  infusion   ketorolac  (TORADOL ) 15 MG/ML injection 15 mg    I have reviewed the patients home medicines and have made adjustments as needed  Problem List / ED Course: Problem List Items Addressed This Visit   None Visit Diagnoses       Pyelonephritis    -  Primary   Relevant Medications   cefTRIAXone  (ROCEPHIN ) 2 g in sodium chloride  0.9 % 100 mL IVPB (Start on 12/05/2024  9:15 PM)     SIRS (systemic inflammatory response syndrome) (HCC)                       This note was created using dictation software, which may contain spelling or grammatical errors.    Franklyn Sid SAILOR, MD 12/08/24 [phone]

## 2024-12-05 NOTE — Assessment & Plan Note (Signed)
 Lab Results  Component Value Date   HGBA1C 6.1 02/02/2024   Stable, pt to continue current medical treatment  - diet , wt control

## 2024-12-05 NOTE — Assessment & Plan Note (Addendum)
 Lab Results  Component Value Date   CREATININE 0.97 12/05/2024   Stable overall, cont to avoid nephrotoxins  [id number]

## 2024-12-05 NOTE — Telephone Encounter (Signed)
 Unfortunately this is quite high, and could represent serious bacterial infection and risk of developing sepsis  Pt should go to ED now for more urgent overall evaluation, and she might mention to them the idea of the Endocarditis at her visit today

## 2024-12-05 NOTE — Assessment & Plan Note (Signed)
 BP Readings from Last 3 Encounters:  12/05/24 120/66  12/05/24 100/72  11/25/24 115/66   Stable, pt to continue medical treatment norvasc  2.5 mg qd

## 2024-12-06 DIAGNOSIS — N1831 Chronic kidney disease, stage 3a: Secondary | ICD-10-CM | POA: Diagnosis not present

## 2024-12-06 DIAGNOSIS — F419 Anxiety disorder, unspecified: Secondary | ICD-10-CM | POA: Diagnosis not present

## 2024-12-06 DIAGNOSIS — N10 Acute tubulo-interstitial nephritis: Secondary | ICD-10-CM | POA: Diagnosis not present

## 2024-12-06 DIAGNOSIS — F32A Depression, unspecified: Secondary | ICD-10-CM | POA: Diagnosis present

## 2024-12-06 DIAGNOSIS — E7800 Pure hypercholesterolemia, unspecified: Secondary | ICD-10-CM | POA: Diagnosis not present

## 2024-12-06 LAB — PROTIME-INR
INR: 1.2 (ref 0.8–1.2)
Prothrombin Time: 15.8 s — ABNORMAL HIGH (ref 11.4–15.2)

## 2024-12-06 LAB — HIV ANTIBODY (ROUTINE TESTING W REFLEX): HIV Screen 4th Generation wRfx: NONREACTIVE

## 2024-12-06 LAB — COMPREHENSIVE METABOLIC PANEL WITH GFR
ALT: 68 U/L — ABNORMAL HIGH (ref 0–44)
AST: 27 U/L (ref 15–41)
Albumin: 3 g/dL — ABNORMAL LOW (ref 3.5–5.0)
Alkaline Phosphatase: 156 U/L — ABNORMAL HIGH (ref 38–126)
Anion gap: 12 (ref 5–15)
BUN: 14 mg/dL (ref 8–23)
CO2: 21 mmol/L — ABNORMAL LOW (ref 22–32)
Calcium: 8.5 mg/dL — ABNORMAL LOW (ref 8.9–10.3)
Chloride: 103 mmol/L (ref 98–111)
Creatinine, Ser: 0.96 mg/dL (ref 0.44–1.00)
GFR, Estimated: >60 mL/min (ref >60)
Glucose, Bld: 97 mg/dL (ref 70–99)
Potassium: 3.9 mmol/L (ref 3.5–5.1)
Sodium: 135 mmol/L (ref 135–145)
Total Bilirubin: 0.3 mg/dL (ref 0.0–1.2)
Total Protein: 6.1 g/dL — ABNORMAL LOW (ref 6.5–8.1)

## 2024-12-06 LAB — CORTISOL-AM, BLOOD: Cortisol - AM: 9 ug/dL (ref 6.7–22.6)

## 2024-12-06 LAB — URINALYSIS, ROUTINE W REFLEX MICROSCOPIC
Bilirubin Urine: NEGATIVE
Ketones, ur: NEGATIVE
Nitrite: NEGATIVE
Specific Gravity, Urine: 1.015 (ref 1.000–1.030)
Urine Glucose: NEGATIVE
Urobilinogen, UA: 0.2 (ref 0.0–1.0)
pH: 6 (ref 5.0–8.0)

## 2024-12-06 LAB — CULTURE, BLOOD (SINGLE)

## 2024-12-06 LAB — CBC
HCT: 28.7 % — ABNORMAL LOW (ref 36.0–46.0)
Hemoglobin: 9.4 g/dL — ABNORMAL LOW (ref 12.0–15.0)
MCH: 27.5 pg (ref 26.0–34.0)
MCHC: 32.8 g/dL (ref 30.0–36.0)
MCV: 83.9 fL (ref 80.0–100.0)
Platelets: 498 K/uL — ABNORMAL HIGH (ref 150–400)
RBC: 3.42 MIL/uL — ABNORMAL LOW (ref 3.87–5.11)
RDW: 14.6 % (ref 11.5–15.5)
WBC: 19.8 K/uL — ABNORMAL HIGH (ref 4.0–10.5)
nRBC: 0 % (ref 0.0–0.2)

## 2024-12-06 MED ORDER — ALBUTEROL SULFATE (2.5 MG/3ML) 0.083% IN NEBU: 2.5000 mg | INHALATION_SOLUTION | Freq: Four times a day (QID) | RESPIRATORY_TRACT | Status: DC | PRN

## 2024-12-06 MED ORDER — FLUTICASONE PROPIONATE 50 MCG/ACT NA SUSP: 2.0000 | Freq: Two times a day (BID) | NASAL | Status: DC | PRN

## 2024-12-06 MED ORDER — ZOLPIDEM TARTRATE 5 MG PO TABS
5.0000 mg | ORAL_TABLET | Freq: Every evening | ORAL | Status: DC | PRN
  Filled 2024-12-06: qty 1

## 2024-12-06 MED ORDER — ASPIRIN 81 MG PO TBEC
81.0000 mg | DELAYED_RELEASE_TABLET | Freq: Every day | ORAL | Status: DC
  Administered 2024-12-06 – 2024-12-07 (×2): 81 mg via ORAL
  Filled 2024-12-06 (×2): qty 1

## 2024-12-06 MED ADMIN — Gabapentin Cap 300 MG: 300 mg | ORAL | NDC 60687059111

## 2024-12-06 MED FILL — Gabapentin Cap 300 MG: 300.0000 mg | ORAL | Qty: 1 | Status: AC

## 2024-12-06 NOTE — Plan of Care (Signed)
  Problem: Education: Goal: Knowledge of General Education information will improve Description: Including pain rating scale, medication(s)/side effects and non-pharmacologic comfort measures Outcome: Progressing   Problem: Health Behavior/Discharge Planning: Goal: Ability to manage health-related needs will improve Outcome: Progressing   Problem: Clinical Measurements: Goal: Respiratory complications will improve Outcome: Progressing Goal: Cardiovascular complication will be avoided Outcome: Progressing   Problem: Activity: Goal: Risk for activity intolerance will decrease Outcome: Progressing   Problem: Nutrition: Goal: Adequate nutrition will be maintained Outcome: Progressing   Problem: Coping: Goal: Level of anxiety will decrease Outcome: Progressing   Problem: Elimination: Goal: Will not experience complications related to bowel motility Outcome: Progressing Goal: Will not experience complications related to urinary retention Outcome: Progressing

## 2024-12-06 NOTE — Progress Notes (Signed)
°   12/06/24 0354  Assess: MEWS Score  Temp (!) 102.5 F (39.2 C)  BP 125/76  MAP (mmHg) 90  Pulse Rate 86  Resp 18  Level of Consciousness Alert  SpO2 98 %  O2 Device Room Air  Assess: MEWS Score  MEWS Temp 2  MEWS Systolic 0  MEWS Pulse 0  MEWS RR 0  MEWS LOC 0  MEWS Score 2  MEWS Score Color Yellow  Assess: if the MEWS score is Yellow or Red  Were vital signs accurate and taken at a resting state? Yes  Does the patient meet 2 or more of the SIRS criteria? Yes  Does the patient have a confirmed or suspected source of infection? Yes  MEWS guidelines implemented  Yes, yellow  Treat  MEWS Interventions Considered administering scheduled or prn medications/treatments as ordered  Take Vital Signs  Increase Vital Sign Frequency  Yellow: Q2hr x1, continue Q4hrs until patient remains green for 12hrs  Escalate  MEWS: Escalate Yellow: Discuss with charge nurse and consider notifying provider and/or RRT  Notify: Charge Nurse/RN  Name of Charge Nurse/RN Notified Corean Seltzer, RN  Provider Notification  Provider Name/Title A. Andrez, NP and N. Dontai-Garmon, NP  Date Provider Notified 12/06/24  Time Provider Notified 0400  Method of Notification Page (secure chat)  Notification Reason Other (Comment) (YELLOW MEWS)  Provider response No new orders  Date of Provider Response 12/06/24  Time of Provider Response 0407  Assess: SIRS CRITERIA  SIRS Temperature  1  SIRS Respirations  0  SIRS Pulse 0  SIRS WBC 0  SIRS Score Sum  1

## 2024-12-06 NOTE — Progress Notes (Signed)
 PROGRESS NOTE  Annette [name] FMW:[id number] DOB: Feb 06, 1956   PCP: Norleen Lynwood ORN, MD  Patient is from: Home.  Lives with husband.  Independently in place of baseline.  DOA: 12/05/2024 LOS: 1  Chief complaints Chief Complaint  Patient presents with   Fever     Brief Narrative / Interim history:  68 year old F with PMH of Raynaud's, HTN, HLD, polyarthralgia, anxiety, depression and osteoporosis presented to PCP with 3 days of fever and chills and sent to ED for further evaluation.  Reportedly had some dysuria.   In ED, febrile to 103.1.  WBC 20 with left shift.  UA with trace LE and rare bacteria.  CT abdomen and pelvis raise concern for left pyelonephritis.  Cultures obtained.  Started on IV fluid and IV ceftriaxone  and admitted for further care.  Subjective: Seen and examined earlier this morning.  Continues to spike fever as high as 102.5 earlier this morning.  Reports feeling better.  Denies acute back pain.  Denies nausea or vomiting.   Assessment and plan: Sepsis due to acute left pyelonephritis: Present on admission.  Febrile with leukocytosis.  CT showed left pyelonephritis.  UA not convincing.  Blood cultures NGTD.  Urine culture pending.  Still with fever and leukocytosis.  No CVA tenderness on exam. - Continue IV ceftriaxone  - Follow urine culture  Essential hypertension: Normotensive. - Continue IV fluid - Hold home amlodipine   Anxiety and depression/insomnia: Stable - Continue home meds  Osteoporosis: On Fosamax  outpatient  Chronic back pain: Denies acute change. - Continue Tylenol  as needed - Courage to get out of bed - PT/OT  Elevated LFT: Improved - Continue monitoring  Normocytic anemia: Slight drop in Hgb likely dilutional from IV fluid.  No overt bleeding - Continue monitoring  Leukocytosis/bandemia: Due to #1 - Monitor  Body mass index is 28.87 kg/m.           DVT prophylaxis:  enoxaparin (LOVENOX) injection 40 mg Start: 12/06/24  1000  Code Status: Full code Family Communication: Updated patient's husband at bedside Level of care: Telemetry Status is: Inpatient Remains inpatient appropriate because: Sepsis due to acute pyelonephritis   Final disposition: Home   55 minutes with more than 50% spent in reviewing records, counseling patient/family and coordinating care.  Consultants:  None  Procedures: None  Microbiology summarized: Blood cultures NGTD Urine culture pending  Objective: Vitals:   12/05/24 2030 12/05/24 2254 12/06/24 0354 12/06/24 0614  BP: 115/62 124/75 125/76 107/63  Pulse: 88 85 86 76  Resp: 18 18 18 18   Temp:  (!) 101.1 F (38.4 C) (!) 102.5 F (39.2 C) 100.1 F (37.8 C)  TempSrc:  Oral Oral Oral  SpO2: 96% 97% 98% 96%  Weight:      Height:        Examination:  GENERAL: No apparent distress.  Nontoxic. HEENT: MMM.  Vision and hearing grossly intact.  NECK: Supple.  No apparent JVD.  RESP:  No IWOB.  Fair aeration bilaterally. CVS:  RRR. Heart sounds normal.  ABD/GI/GU: BS+. Abd soft, NTND.  No CVA tenderness. MSK/EXT:  Moves extremities. No apparent deformity. No edema.  SKIN: no apparent skin lesion or wound NEURO: AA.  Oriented appropriately.  No apparent focal neuro deficit. PSYCH: Calm. Normal affect.  Sch Meds:  Scheduled Meds:  enoxaparin (LOVENOX) injection  40 mg Subcutaneous Q24H   Continuous Infusions:  cefTRIAXone  (ROCEPHIN )  IV     lactated ringers 150 mL/hr at 12/05/24 2115   lactated ringers 150 mL/hr (  12/05/24 2312)   PRN Meds:.acetaminophen  **OR** acetaminophen , ketorolac , ondansetron  **OR** ondansetron  (ZOFRAN ) IV  Antimicrobials: Anti-infectives (From admission, onward)    Start     Dose/Rate Route Frequency Ordered Stop   12/06/24 2200  cefTRIAXone  (ROCEPHIN ) 2 g in sodium chloride  0.9 % 100 mL IVPB        2 g 200 mL/hr over 30 Minutes Intravenous Every 24 hours 12/05/24 2252 12/13/24 2159   12/05/24 2115  cefTRIAXone  (ROCEPHIN ) 2 g in  sodium chloride  0.9 % 100 mL IVPB        2 g 200 mL/hr over 30 Minutes Intravenous  Once 12/05/24 2101 12/05/24 2145        I have personally reviewed the following labs and images: CBC: Recent Labs  Lab 12/05/24 1600 12/05/24 1809 12/06/24 0028  WBC 21.3 Repeated and verified X2.* 21.2* 19.8*  NEUTROABS 18.0* 18.6*  --   HGB 11.0* 11.0* 9.4*  HCT 33.7* 34.0* 28.7*  MCV 83.6 85.9 83.9  PLT 570.0* 564* 498*   BMP &GFR Recent Labs  Lab 12/05/24 1600 12/05/24 1809 12/06/24 0028  NA 137 135 135  K 4.2 3.9 3.9  CL 103 101 103  CO2 26 21* 21*  GLUCOSE 110* 109* 97  BUN 17 18 14   CREATININE 0.95 0.97 0.96  CALCIUM  9.1 9.2 8.5*   Estimated Creatinine Clearance: 55.5 mL/min (by C-G formula based on SCr of 0.96 mg/dL). Liver & Pancreas: Recent Labs  Lab 12/05/24 1600 12/05/24 1809 12/06/24 0028  AST 26 40 27  ALT 69* 91* 68*  ALKPHOS 156* 190* 156*  BILITOT 0.6 0.6 0.3  PROT 7.3 7.5 6.1*  ALBUMIN 3.6 3.6 3.0*   No results for input(s): LIPASE, AMYLASE in the last 168 hours. No results for input(s): AMMONIA in the last 168 hours. Diabetic: No results for input(s): HGBA1C in the last 72 hours. No results for input(s): GLUCAP in the last 168 hours. Cardiac Enzymes: No results for input(s): CKTOTAL, CKMB, CKMBINDEX, TROPONINI in the last 168 hours. No results for input(s): PROBNP in the last 8760 hours. Coagulation Profile: Recent Labs  Lab 12/05/24 1809 12/06/24 0028  INR 1.2 1.2   Thyroid  Function Tests: No results for input(s): TSH, T4TOTAL, FREET4, T3FREE, THYROIDAB in the last 72 hours. Lipid Profile: No results for input(s): CHOL, HDL, LDLCALC, TRIG, CHOLHDL, LDLDIRECT in the last 72 hours. Anemia Panel: No results for input(s): VITAMINB12, FOLATE, FERRITIN, TIBC, IRON, RETICCTPCT in the last 72 hours. Urine analysis:    Component Value Date/Time   COLORURINE YELLOW 12/05/2024 1848   APPEARANCEUR  CLEAR 12/05/2024 1848   LABSPEC 1.017 12/05/2024 1848   PHURINE 5.0 12/05/2024 1848   GLUCOSEU NEGATIVE 12/05/2024 1848   GLUCOSEU NEGATIVE 12/05/2024 1600   HGBUR NEGATIVE 12/05/2024 1848   BILIRUBINUR NEGATIVE 12/05/2024 1848   KETONESUR NEGATIVE 12/05/2024 1848   PROTEINUR 30 (A) 12/05/2024 1848   UROBILINOGEN 0.2 12/05/2024 1600   NITRITE NEGATIVE 12/05/2024 1848   LEUKOCYTESUR TRACE (A) 12/05/2024 1848   Sepsis Labs: Invalid input(s): PROCALCITONIN, LACTICIDVEN  Microbiology: Recent Results (from the past 240 hours)  Culture, blood (single) w Reflex to ID Panel     Status: None   Collection Time: 12/05/24  4:26 PM   Specimen: Blood  Result Value Ref Range Status   Source: CANCELED      Comment: Result canceled by the ancillary.   Status: CANCELED      Comment: Result canceled by the ancillary.   Result: CANCELED  Comment: Result canceled by the ancillary.  Culture, blood (single) w Reflex to ID Panel     Status: None   Collection Time: 12/05/24  4:26 PM  Result Value Ref Range Status   Source: CANCELED      Comment: Result canceled by the ancillary.   Status: CANCELED      Comment: Result canceled by the ancillary.   Result: CANCELED      Comment: Result canceled by the ancillary.  Culture, blood (Routine x 2)     Status: None (Preliminary result)   Collection Time: 12/05/24  6:08 PM   Specimen: BLOOD LEFT ARM  Result Value Ref Range Status   Specimen Description   Final    BLOOD LEFT ARM Performed at [facility], [address], [place], [place] [address]    Special Requests   Final    BOTTLES DRAWN AEROBIC AND ANAEROBIC Blood Culture adequate volume Performed at [facility], [address], [place], [place] [address]    Culture   Final    NO GROWTH < 12 HOURS Performed at [facility], [address], [place], [place] [address]    Report Status PENDING  Incomplete  Culture, blood (Routine x 2)     Status: None  (Preliminary result)   Collection Time: 12/05/24  6:09 PM   Specimen: BLOOD RIGHT ARM  Result Value Ref Range Status   Specimen Description   Final    BLOOD RIGHT ARM Performed at [facility], [address], [place], [place] [address]    Special Requests   Final    BOTTLES DRAWN AEROBIC AND ANAEROBIC Blood Culture adequate volume Performed at [facility], [address] [place], [place] [address]    Culture   Final    NO GROWTH < 12 HOURS Performed at [facility], [address], [place], [place] [address]    Report Status PENDING  Incomplete    Radiology Studies: CT Head Wo Contrast Result Date: 12/05/2024 EXAM: CT HEAD WITHOUT 12/05/2024 08:52:19 PM TECHNIQUE: CT of the head was performed without the administration of intravenous contrast. Automated exposure control, iterative reconstruction, and/or weight based adjustment of the mA/kV was utilized to reduce the radiation dose to as low as reasonably achievable. COMPARISON: None available. CLINICAL HISTORY: Headache, increasing frequency or severity FINDINGS: BRAIN AND VENTRICLES: No acute intracranial hemorrhage. No mass effect or midline shift. No extra-axial fluid collection. No evidence of acute infarct. No hydrocephalus. ORBITS: No acute abnormality. SINUSES AND MASTOIDS: No acute abnormality. SOFT TISSUES AND SKULL: No acute skull fracture. No acute soft tissue abnormality. IMPRESSION: 1. No acute intracranial abnormality. Electronically signed by: Pinkie Pebbles MD 12/05/2024 08:56 PM EST RP Workstation: [id number]   CT ABDOMEN PELVIS W CONTRAST Result Date: 12/05/2024 EXAM: CT ABDOMEN AND PELVIS WITH CONTRAST 12/05/2024 08:52:19 PM TECHNIQUE: CT of the abdomen and pelvis was performed with the administration of 100 mL of iohexol (OMNIPAQUE) 300 MG/ML solution. Multiplanar reformatted images are provided for review. Automated exposure control, iterative reconstruction, and/or weight-based  adjustment of the mA/kV was utilized to reduce the radiation dose to as low as reasonably achievable. COMPARISON: 03/04/2021. CLINICAL HISTORY: Sepsis; diarrhea, fever. FINDINGS: LOWER CHEST: Mild eventration of the hemidiaphragm with associated right basilar atelectasis. LIVER: The liver is unremarkable. GALLBLADDER AND BILE DUCTS: Gallbladder is unremarkable. No biliary ductal dilatation. SPLEEN: No acute abnormality. PANCREAS: No acute abnormality. ADRENAL GLANDS: No acute abnormality. KIDNEYS, URETERS AND BLADDER: Heterogeneous  enhancement of the left kidney, better visualized on delayed imaging, suggesting pyelonephritis. No stones in the kidneys or ureters. No hydronephrosis. No perinephric or periureteral stranding. The bladder is unremarkable on CT. GI AND BOWEL: Stomach demonstrates no acute abnormality. There is no bowel obstruction. Normal appendix (image 54). PERITONEUM AND RETROPERITONEUM: No ascites. No free air. VASCULATURE: Aorta is normal in caliber. LYMPH NODES: No lymphadenopathy. REPRODUCTIVE ORGANS: Suspected small uterine fibroids. BONES AND SOFT TISSUES: No acute osseous abnormality. No focal soft tissue abnormality. IMPRESSION: 1. Left pyelonephritis. Electronically signed by: Pinkie Pebbles MD 12/05/2024 08:55 PM EST RP Workstation: [id number]   DG Chest Port 1 View if patient is in a treatment room. Result Date: 12/05/2024 CLINICAL DATA:  Sepsis. EXAM: PORTABLE CHEST 1 VIEW COMPARISON:  CT dated 12/05/2024. FINDINGS: Minimal bibasilar atelectasis. No focal consolidation, pleural effusion, pneumothorax. The cardiac silhouette is within normal limits. No acute osseous pathology. IMPRESSION: No active disease. Electronically Signed   By: Vanetta Chou M.D.   On: 12/05/2024 19:20   DG Chest 2 View Result Date: 12/05/2024 EXAM: 2 VIEW(S) XRAY OF THE CHEST 12/05/2024 04:32:06 PM COMPARISON: 03/11/2024 CLINICAL HISTORY: persistent fever x 3 wks FINDINGS: LUNGS AND PLEURA: No focal  pulmonary opacity. No pleural effusion. No pneumothorax. HEART AND MEDIASTINUM: No acute abnormality of the cardiac and mediastinal silhouettes. BONES AND SOFT TISSUES: No acute osseous abnormality. IMPRESSION: 1. No acute cardiopulmonary process. Electronically signed by: Lynwood Seip MD 12/05/2024 04:56 PM EST RP Workstation: [id number]      Ujbz T. Breeann Reposa [facility]  If 7PM-7AM, please contact night-coverage [email or web address] 12/06/2024, 11:54 AM

## 2024-12-06 NOTE — Plan of Care (Signed)
°  Problem: Activity: Goal: Risk for activity intolerance will decrease Outcome: Progressing   Problem: Coping: Goal: Level of anxiety will decrease Outcome: Progressing   Problem: Pain Managment: Goal: General experience of comfort will improve and/or be controlled Outcome: Progressing   Problem: Respiratory: Goal: Ability to maintain adequate ventilation will improve Outcome: Progressing

## 2024-12-07 ENCOUNTER — Other Ambulatory Visit (HOSPITAL_COMMUNITY): Payer: Self-pay

## 2024-12-07 DIAGNOSIS — N390 Urinary tract infection, site not specified: Secondary | ICD-10-CM | POA: Diagnosis not present

## 2024-12-07 DIAGNOSIS — F411 Generalized anxiety disorder: Secondary | ICD-10-CM | POA: Diagnosis present

## 2024-12-07 DIAGNOSIS — E7800 Pure hypercholesterolemia, unspecified: Secondary | ICD-10-CM

## 2024-12-07 DIAGNOSIS — F32A Depression, unspecified: Secondary | ICD-10-CM | POA: Diagnosis not present

## 2024-12-07 DIAGNOSIS — A4150 Gram-negative sepsis, unspecified: Secondary | ICD-10-CM | POA: Diagnosis not present

## 2024-12-07 LAB — URINE CULTURE

## 2024-12-07 LAB — CBC
HCT: 27.4 % — ABNORMAL LOW (ref 36.0–46.0)
Hemoglobin: 8.9 g/dL — ABNORMAL LOW (ref 12.0–15.0)
MCH: 27.7 pg (ref 26.0–34.0)
MCHC: 32.5 g/dL (ref 30.0–36.0)
MCV: 85.4 fL (ref 80.0–100.0)
Platelets: 513 K/uL — ABNORMAL HIGH (ref 150–400)
RBC: 3.21 MIL/uL — ABNORMAL LOW (ref 3.87–5.11)
RDW: 14.7 % (ref 11.5–15.5)
WBC: 11.7 K/uL — ABNORMAL HIGH (ref 4.0–10.5)
nRBC: 0 % (ref 0.0–0.2)

## 2024-12-07 LAB — COMPREHENSIVE METABOLIC PANEL WITH GFR
ALT: 92 U/L — ABNORMAL HIGH (ref 0–44)
AST: 65 U/L — ABNORMAL HIGH (ref 15–41)
Albumin: 2.9 g/dL — ABNORMAL LOW (ref 3.5–5.0)
Alkaline Phosphatase: 204 U/L — ABNORMAL HIGH (ref 38–126)
Anion gap: 11 (ref 5–15)
BUN: 12 mg/dL (ref 8–23)
CO2: 22 mmol/L (ref 22–32)
Calcium: 8.5 mg/dL — ABNORMAL LOW (ref 8.9–10.3)
Chloride: 106 mmol/L (ref 98–111)
Creatinine, Ser: 0.89 mg/dL (ref 0.44–1.00)
GFR, Estimated: >60 mL/min (ref >60)
Glucose, Bld: 101 mg/dL — ABNORMAL HIGH (ref 70–99)
Potassium: 3.8 mmol/L (ref 3.5–5.1)
Sodium: 139 mmol/L (ref 135–145)
Total Bilirubin: 0.2 mg/dL (ref 0.0–1.2)
Total Protein: 6 g/dL — ABNORMAL LOW (ref 6.5–8.1)

## 2024-12-07 LAB — MAGNESIUM: Magnesium: 2.2 mg/dL (ref 1.7–2.4)

## 2024-12-07 MED ORDER — CEFADROXIL 500 MG PO CAPS
1000.0000 mg | ORAL_CAPSULE | Freq: Two times a day (BID) | ORAL | 0 refills | Status: AC
  Filled 2024-12-07: qty 36, 9d supply, fill #0

## 2024-12-07 NOTE — Plan of Care (Signed)

## 2024-12-07 NOTE — Discharge Summary (Signed)
 Physician Discharge Summary  Annette [name] DOB: [birth year] DOA: 12/05/2024  PCP: Norleen Lynwood ORN, MD  Admit date: 12/05/2024 Discharge date: 12/07/2024  Admitted From: Home Disposition: Home Recommendations for Outpatient Follow-up:  Outpatient follow-up with PCP in 1 to 2 weeks Check CMP and CBC at follow-up Please follow up on the following pending results: None  Home Health: No need identified Equipment/Devices: No need identified  Discharge Condition: Stable CODE STATUS: Full code   Follow-up Information     Norleen Lynwood ORN, MD. Schedule an appointment as soon as possible for a visit in 1 week(s).   Specialties: Internal Medicine, Radiology Contact information: [address] [place] [address] [phone]                 Hospital course 68 year old F with PMH of Raynaud's, HTN, HLD, polyarthralgia, anxiety, depression and osteoporosis presented to PCP with 3 days of fever and chills and sent to ED for further evaluation.  Reportedly had some dysuria.    In ED, febrile to 103.1.  WBC 20 with left shift.  UA with trace LE and rare bacteria.  CT abdomen and pelvis raise concern for left pyelonephritis.  Cultures obtained.  Started on IV fluid and IV ceftriaxone  and admitted for further care.  The next day, patient felt better.  Blood cultures NGTD.  Continued on IV ceftriaxone .  On the day of discharge, patient felt well ready to go home.  Leukocytosis basically resolved.  Fever curve trended down.  Blood culture remained stable.  Urine culture with pansensitive E. coli except to ampicillin and Unasyn.  She is discharged on p.o. cefadroxil for 9 more days to complete treatment course for acute pyelonephritis.  She will follow-up with PCP in 1 to 2 weeks.  See individual problem list below for more.   Problems addressed during this hospitalization Sepsis due to acute left pyelonephritis and E. coli UTI: Present on admission.  Febrile with  leukocytosis.  CT showed left pyelonephritis.  UA not convincing but urine culture with E. coli x 2.  Improved. -Received IV ceftriaxone  for 2 days -Discharged on p.o. cefadroxil for 9 more days.   Essential hypertension: Normotensive.  Not taking amlodipine  at home. -Discontinued amlodipine    Anxiety and depression/insomnia: Stable - Continue home meds   Osteoporosis: On Fosamax  outpatient   Chronic back pain: Denies acute change. - Continue Tylenol  as needed - Advised to avoid NSAID.   Elevated LFT: Likely due to sepsis.  Improved - Recheck CMP at follow-up.   Normocytic anemia: Slight drop in Hgb likely dilutional from IV fluid.  No overt bleeding - Recheck CBC at follow-up.   Leukocytosis/bandemia: Due to #1.  Almost resolved. - Recheck CBC at follow-up.  Body mass index is 28.87 kg/m.           Consultations:  Time spent 35  minutes  Vital signs Vitals:   12/06/24 1755 12/06/24 2208 12/07/24 0606 12/07/24 1313  BP: 108/68 108/71 124/71 112/80  Pulse: 74 73 77 67  Temp: 99.3 F (37.4 C) 99.6 F (37.6 C) (!) 100.6 F (38.1 C) 98.2 F (36.8 C)  Resp: 16 18 18 16   Height:      Weight:      SpO2: 99% 98% 96% 100%  TempSrc: Oral Oral Oral Oral  BMI (Calculated):         Discharge exam  GENERAL: No apparent distress.  Nontoxic. HEENT: MMM.  Vision and hearing grossly intact.  NECK: Supple.  No apparent JVD.  RESP:  No IWOB.  Fair aeration bilaterally. CVS:  RRR. Heart sounds normal.  ABD/GI/GU: BS+. Abd soft, NTND.  No CVA tenderness. MSK/EXT:  Moves extremities. No apparent deformity. No edema.  SKIN: no apparent skin lesion or wound NEURO: Awake and alert. Oriented appropriately.  No apparent focal neuro deficit. PSYCH: Calm. Normal affect.   Discharge Instructions Discharge Instructions     Discharge instructions   Complete by: As directed    It has been a pleasure taking care of you!  You were hospitalized due to left kidney infection and  urinary tract infection for which you have been started on antibiotics.  Your symptoms improved.  We are discharging you on more antibiotics to complete treatment course.  Is very important that you complete the whole course of antibiotics regardless of improvement.  Follow-up with your primary care doctor in 1 to 2 weeks or sooner if needed.   Take care,   Increase activity slowly   Complete by: As directed       Allergies as of 12/07/2024   No Known Allergies      Medication List     STOP taking these medications    amLODipine  2.5 MG tablet Commonly known as: NORVASC    levofloxacin  500 MG tablet Commonly known as: LEVAQUIN    metroNIDAZOLE  500 MG tablet Commonly known as: FLAGYL    traMADol  50 MG tablet Commonly known as: ULTRAM        TAKE these medications    albuterol  108 (90 Base) MCG/ACT inhaler Commonly known as: VENTOLIN  HFA Inhale 2 puffs into the lungs every 6 (six) hours as needed for wheezing or shortness of breath.   alendronate  70 MG tablet Commonly known as: FOSAMAX  Take 1 tablet (70 mg total) by mouth every 7 (seven) days. Take with a full glass of water on an empty stomach.   aspirin  EC 81 MG tablet Take 1 tablet (81 mg total) by mouth daily. Swallow whole.   cefadroxil 500 MG capsule Commonly known as: DURICEF Take 2 capsules (1,000 mg total) by mouth 2 (two) times daily for 9 days. Start taking on: December 08, 2024   Estradiol  10 MCG Tabs vaginal tablet Commonly known as: Vagifem  Place 1 tablet (10 mcg total) vaginally 2 (two) times a week.   fluticasone  50 MCG/ACT nasal spray Commonly known as: FLONASE  Place 2 sprays into both nostrils 2 (two) times daily as needed (Nasal congestion and drainage).   gabapentin  300 MG capsule Commonly known as: NEURONTIN  Take 1 capsule (300 mg total) by mouth 3 (three) times daily as needed. What changed: reasons to take this   rosuvastatin  20 MG tablet Commonly known as: Crestor  Take 1 tablet (20  mg total) by mouth daily.   zolpidem  10 MG tablet Commonly known as: AMBIEN  Take 1 tablet (10 mg total) by mouth at bedtime as needed. What changed: reasons to take this         Procedures/Studies:   CT Head Wo Contrast Result Date: 12/05/2024 EXAM: CT HEAD WITHOUT 12/05/2024 08:52:19 PM TECHNIQUE: CT of the head was performed without the administration of intravenous contrast. Automated exposure control, iterative reconstruction, and/or weight based adjustment of the mA/kV was utilized to reduce the radiation dose to as low as reasonably achievable. COMPARISON: None available. CLINICAL HISTORY: Headache, increasing frequency or severity FINDINGS: BRAIN AND VENTRICLES: No acute intracranial hemorrhage. No mass effect or midline shift. No extra-axial fluid collection. No evidence of acute infarct. No hydrocephalus. ORBITS: No acute abnormality.  SINUSES AND MASTOIDS: No acute abnormality. SOFT TISSUES AND SKULL: No acute skull fracture. No acute soft tissue abnormality. IMPRESSION: 1. No acute intracranial abnormality. Electronically signed by: Pinkie Pebbles MD 12/05/2024 08:56 PM EST RP Workstation: [id number]   CT ABDOMEN PELVIS W CONTRAST Result Date: 12/05/2024 EXAM: CT ABDOMEN AND PELVIS WITH CONTRAST 12/05/2024 08:52:19 PM TECHNIQUE: CT of the abdomen and pelvis was performed with the administration of 100 mL of iohexol (OMNIPAQUE) 300 MG/ML solution. Multiplanar reformatted images are provided for review. Automated exposure control, iterative reconstruction, and/or weight-based adjustment of the mA/kV was utilized to reduce the radiation dose to as low as reasonably achievable. COMPARISON: 03/04/2021. CLINICAL HISTORY: Sepsis; diarrhea, fever. FINDINGS: LOWER CHEST: Mild eventration of the hemidiaphragm with associated right basilar atelectasis. LIVER: The liver is unremarkable. GALLBLADDER AND BILE DUCTS: Gallbladder is unremarkable. No biliary ductal dilatation. SPLEEN: No acute  abnormality. PANCREAS: No acute abnormality. ADRENAL GLANDS: No acute abnormality. KIDNEYS, URETERS AND BLADDER: Heterogeneous enhancement of the left kidney, better visualized on delayed imaging, suggesting pyelonephritis. No stones in the kidneys or ureters. No hydronephrosis. No perinephric or periureteral stranding. The bladder is unremarkable on CT. GI AND BOWEL: Stomach demonstrates no acute abnormality. There is no bowel obstruction. Normal appendix (image 54). PERITONEUM AND RETROPERITONEUM: No ascites. No free air. VASCULATURE: Aorta is normal in caliber. LYMPH NODES: No lymphadenopathy. REPRODUCTIVE ORGANS: Suspected small uterine fibroids. BONES AND SOFT TISSUES: No acute osseous abnormality. No focal soft tissue abnormality. IMPRESSION: 1. Left pyelonephritis. Electronically signed by: Pinkie Pebbles MD 12/05/2024 08:55 PM EST RP Workstation: [id number]   DG Chest Port 1 View if patient is in a treatment room. Result Date: 12/05/2024 CLINICAL DATA:  Sepsis. EXAM: PORTABLE CHEST 1 VIEW COMPARISON:  CT dated 12/05/2024. FINDINGS: Minimal bibasilar atelectasis. No focal consolidation, pleural effusion, pneumothorax. The cardiac silhouette is within normal limits. No acute osseous pathology. IMPRESSION: No active disease. Electronically Signed   By: Vanetta Chou M.D.   On: 12/05/2024 19:20   DG Chest 2 View Result Date: 12/05/2024 EXAM: 2 VIEW(S) XRAY OF THE CHEST 12/05/2024 04:32:06 PM COMPARISON: 03/11/2024 CLINICAL HISTORY: persistent fever x 3 wks FINDINGS: LUNGS AND PLEURA: No focal pulmonary opacity. No pleural effusion. No pneumothorax. HEART AND MEDIASTINUM: No acute abnormality of the cardiac and mediastinal silhouettes. BONES AND SOFT TISSUES: No acute osseous abnormality. IMPRESSION: 1. No acute cardiopulmonary process. Electronically signed by: Lynwood Seip MD 12/05/2024 04:56 PM EST RP Workstation: [id number]       The results of significant diagnostics from this  hospitalization (including imaging, microbiology, ancillary and laboratory) are listed below for reference.     Microbiology: Recent Results (from the past 240 hours)  Urine Culture     Status: Abnormal   Collection Time: 12/05/24  4:00 PM   Specimen: Blood  Result Value Ref Range Status   Source: BLOOD PER THE RQ  Final   Status: FINAL  Final   Isolate 1: Escherichia coli (A)  Final    Comment: 10,000-49,000 CFU/mL of Escherichia coli      Susceptibility   Escherichia coli - URINE CULTURE, REFLEX    AMOX/CLAVULANIC 4 Sensitive     AMPICILLIN/SULBACTAM >=32 Resistant     CEFAZOLIN* 2 Not Reportable      * For infections other than uncomplicated UTI caused by E. coli, K. pneumoniae or P. mirabilis: Cefazolin is resistant if MIC > or = 8 mcg/mL. (Distinguishing susceptible versus intermediate for isolates with MIC < or = 4 mcg/mL requires additional testing.) For uncomplicated  UTI caused by E. coli, K. pneumoniae or P. mirabilis: Cefazolin is susceptible if MIC <32 mcg/mL and predicts susceptible to the oral agents cefaclor, cefdinir , cefpodoxime, cefprozil, cefuroxime, cephalexin  and loracarbef.     CEFTAZIDIME <=0.5 Sensitive     CEFEPIME <=0.12 Sensitive     CEFTRIAXONE  <=0.25 Sensitive     CIPROFLOXACIN  <=0.06 Sensitive     LEVOFLOXACIN  <=0.12 Sensitive     GENTAMICIN <=1 Sensitive     IMIPENEM <=0.25 Sensitive     MEROPENEM <=0.25 Sensitive     NITROFURANTOIN <=16 Sensitive     PIP/TAZO <=4 Sensitive     TRIMETH/SULFA* <=20 Sensitive      * For infections other than uncomplicated UTI caused by E. coli, K. pneumoniae or P. mirabilis: Cefazolin is resistant if MIC > or = 8 mcg/mL. (Distinguishing susceptible versus intermediate for isolates with MIC < or = 4 mcg/mL requires additional testing.) For uncomplicated UTI caused by E. coli, K. pneumoniae or P. mirabilis: Cefazolin is susceptible if MIC <32 mcg/mL and predicts susceptible to the oral agents cefaclor,  cefdinir , cefpodoxime, cefprozil, cefuroxime, cephalexin  and loracarbef. Legend: S = Susceptible  I = Intermediate R = Resistant  NS = Not susceptible SDD = Susceptible Dose Dependent * = Not Tested  NR = Not Reported **NN = See Therapy Comments   Culture, blood (single) w Reflex to ID Panel     Status: None   Collection Time: 12/05/24  4:26 PM   Specimen: Blood  Result Value Ref Range Status   Source: CANCELED      Comment: Result canceled by the ancillary.   Status: CANCELED      Comment: Result canceled by the ancillary.   Result: CANCELED      Comment: Result canceled by the ancillary.  Culture, blood (single) w Reflex to ID Panel     Status: None   Collection Time: 12/05/24  4:26 PM  Result Value Ref Range Status   Source: CANCELED      Comment: Result canceled by the ancillary.   Status: CANCELED      Comment: Result canceled by the ancillary.   Result: CANCELED      Comment: Result canceled by the ancillary.  Culture, blood (Routine x 2)     Status: None (Preliminary result)   Collection Time: 12/05/24  6:08 PM   Specimen: BLOOD LEFT ARM  Result Value Ref Range Status   Specimen Description   Final    BLOOD LEFT ARM Performed at [facility], [address] [place], [place] [address]    Special Requests   Final    BOTTLES DRAWN AEROBIC AND ANAEROBIC Blood Culture adequate volume Performed at [facility], [address], [place], [place] [address]    Culture   Final    NO GROWTH 2 DAYS Performed at [facility], [address] [place], [place] [address]    Report Status PENDING  Incomplete  Culture, blood (Routine x 2)     Status: None (Preliminary result)   Collection Time: 12/05/24  6:09 PM   Specimen: BLOOD RIGHT ARM  Result Value Ref Range Status   Specimen Description   Final    BLOOD RIGHT ARM Performed at [facility], [address] [place], [place] [address]    Special Requests   Final    BOTTLES DRAWN  AEROBIC AND ANAEROBIC Blood Culture adequate volume Performed at [facility], [address] [place], [place]  [address]    Culture   Final    NO GROWTH 2 DAYS Performed at [facility], [address], [place], [place] [address]    Report Status PENDING  Incomplete  Urine Culture     Status: Abnormal (Preliminary result)   Collection Time: 12/05/24  6:48 PM   Specimen: Urine, Clean Catch  Result Value Ref Range Status   Specimen Description   Final    URINE, CLEAN CATCH Performed at [facility], [address] [place], [place] [address]    Special Requests   Final    NONE Performed at [facility], [address] [place], [place] [address]    Culture >=100,000 COLONIES/mL ESCHERICHIA COLI (A)  Final   Report Status PENDING  Incomplete     Labs:  CBC: Recent Labs  Lab 12/05/24 1600 12/05/24 1809 12/06/24 0028 12/07/24 0553  WBC 21.3 Repeated and verified X2.* 21.2* 19.8* 11.7*  NEUTROABS 18.0* 18.6*  --   --   HGB 11.0* 11.0* 9.4* 8.9*  HCT 33.7* 34.0* 28.7* 27.4*  MCV 83.6 85.9 83.9 85.4  PLT 570.0* 564* 498* 513*   BMP &GFR Recent Labs  Lab 12/05/24 1600 12/05/24 1809 12/06/24 0028 12/07/24 0553  NA 137 135 135 139  K 4.2 3.9 3.9 3.8  CL 103 101 103 106  CO2 26 21* 21* 22  GLUCOSE 110* 109* 97 101*  BUN 17 18 14 12   CREATININE 0.95 0.97 0.96 0.89  CALCIUM  9.1 9.2 8.5* 8.5*  MG  --   --   --  2.2   Estimated Creatinine Clearance: 59.9 mL/min (by C-G formula based on SCr of 0.89 mg/dL). Liver & Pancreas: Recent Labs  Lab 12/05/24 1600 12/05/24 1809 12/06/24 0028 12/07/24 0553  AST 26 40 27 65*  ALT 69* 91* 68* 92*  ALKPHOS 156* 190* 156* 204*  BILITOT 0.6 0.6 0.3 0.2  PROT 7.3 7.5 6.1* 6.0*  ALBUMIN 3.6 3.6 3.0* 2.9*   No results for input(s): LIPASE, AMYLASE in the last 168 hours. No results for input(s): AMMONIA in the last 168 hours. Diabetic: No results for input(s):  HGBA1C in the last 72 hours. No results for input(s): GLUCAP in the last 168 hours. Cardiac Enzymes: No results for input(s): CKTOTAL, CKMB, CKMBINDEX, TROPONINI in the last 168 hours. No results for input(s): PROBNP in the last 8760 hours. Coagulation Profile: Recent Labs  Lab 12/05/24 1809 12/06/24 0028  INR 1.2 1.2   Thyroid  Function Tests: No results for input(s): TSH, T4TOTAL, FREET4, T3FREE, THYROIDAB in the last 72 hours. Lipid Profile: No results for input(s): CHOL, HDL, LDLCALC, TRIG, CHOLHDL, LDLDIRECT in the last 72 hours. Anemia Panel: No results for input(s): VITAMINB12, FOLATE, FERRITIN, TIBC, IRON, RETICCTPCT in the last 72 hours. Urine analysis:    Component Value Date/Time   COLORURINE YELLOW 12/05/2024 1848   APPEARANCEUR CLEAR 12/05/2024 1848   LABSPEC 1.017 12/05/2024 1848   PHURINE 5.0 12/05/2024 1848   GLUCOSEU NEGATIVE 12/05/2024 1848   GLUCOSEU NEGATIVE 12/05/2024 1600   HGBUR NEGATIVE 12/05/2024 1848   BILIRUBINUR NEGATIVE 12/05/2024 1848   KETONESUR NEGATIVE 12/05/2024 1848   PROTEINUR 30 (A) 12/05/2024 1848   UROBILINOGEN 0.2 12/05/2024 1600   NITRITE NEGATIVE 12/05/2024 1848   LEUKOCYTESUR TRACE (A) 12/05/2024 1848   Sepsis Labs: Invalid input(s): PROCALCITONIN, LACTICIDVEN   SIGNED:  Sipriano Fendley T Muskaan Smet, MD  [facility] 12/07/2024, 3:41 PM

## 2024-12-07 NOTE — Progress Notes (Signed)
°   12/07/24 1046  TOC Brief Assessment  Insurance and Status Reviewed  Patient has primary care physician Yes Vilinda, Lynwood ORN, MD)  Home environment has been reviewed Home with spouse  Prior level of function: Independent  Prior/Current Home Services No current home services  Social Drivers of Health Review SDOH reviewed no interventions necessary  Readmission risk has been reviewed Yes  Transition of care needs no transition of care needs at this time

## 2024-12-07 NOTE — Progress Notes (Signed)
 Annette [name] Fell to be discharged home per MD order. Discussed with the patient and all questions fully answered.  Skin clean, dry, and intact without evidence of skin break down. IV catheters discontinued intact. Site without signs and symptoms of complications. Dressing and pressure applied.  An After Visit Summary was printed and given to the patient.  Patient escorted via [facility], and discharged home via private auto.  [name]  12/07/2024

## 2024-12-07 NOTE — Evaluation (Signed)
 Physical Therapy Evaluation Patient Details Name: Annette [name] MRN: [id number] DOB: 1956-10-05 Today's Date: 12/07/2024  History of Present Illness  68 year old F presented to PCP with 3 days of fever and chills and sent to ED for further evaluation. Dx of sepsis due to acute L pyelonephritis. with PMH of Raynaud's, HTN, HLD, polyarthralgia, anxiety, depression and osteoporosis.  Clinical Impression  Pt is mobilizing well, she ambulated 380' without an assistive device, no loss of balance, HR 86 with ambulation, SpO2 98% on room air with ambulation. Encouraged pt to ambulate independently in halls TID to minimize deconditioning during hospitalization. No further PT indicated, will sign off.         If plan is discharge home, recommend the following:     Can travel by private vehicle        Equipment Recommendations None recommended by PT  Recommendations for Other Services       Functional Status Assessment Patient has not had a recent decline in their functional status     Precautions / Restrictions Precautions Precautions: None Recall of Precautions/Restrictions: Intact Restrictions Weight Bearing Restrictions Per Provider Order: No      Mobility  Bed Mobility               General bed mobility comments: sitting up on window seat    Transfers Overall transfer level: Independent                      Ambulation/Gait Ambulation/Gait assistance: Independent Gait Distance (Feet): 380 Feet Assistive device: None Gait Pattern/deviations: WFL(Within Functional Limits) Gait velocity: WNL     General Gait Details: steady, no loss of balance  Stairs            Wheelchair Mobility     Tilt Bed    Modified Rankin (Stroke Patients Only)       Balance Overall balance assessment: Independent                                           Pertinent Vitals/Pain Pain Assessment Pain Assessment: 0-10 Pain Score: 2  Pain  Location: abdomen Pain Descriptors / Indicators: Aching Pain Intervention(s): Limited activity within patient's tolerance, Monitored during session    Home Living Family/patient expects to be discharged to:: Private residence Living Arrangements: Spouse/significant other Available Help at Discharge: Family           Home Layout: Two level        Prior Function Prior Level of Function : Independent/Modified Independent             Mobility Comments: works out at gym, walks without AD, denies falls in past 6 months ADLs Comments: independent     Extremity/Trunk Assessment   Upper Extremity Assessment Upper Extremity Assessment: Overall WFL for tasks assessed    Lower Extremity Assessment Lower Extremity Assessment: Overall WFL for tasks assessed    Cervical / Trunk Assessment Cervical / Trunk Assessment: Normal  Communication   Communication Communication: No apparent difficulties    Cognition Arousal: Alert Behavior During Therapy: WFL for tasks assessed/performed   PT - Cognitive impairments: No apparent impairments                         Following commands: Intact       Cueing       General Comments  Exercises     Assessment/Plan    PT Assessment Patient does not need any further PT services  PT Problem List         PT Treatment Interventions      PT Goals (Current goals can be found in the Care Plan section)  Acute Rehab PT Goals Patient Stated Goal: work out at the gym PT Goal Formulation: All assessment and education complete, DC therapy    Frequency       Co-evaluation               AM-PAC PT 6 Clicks Mobility  Outcome Measure Help needed turning from your back to your side while in a flat bed without using bedrails?: None Help needed moving from lying on your back to sitting on the side of a flat bed without using bedrails?: None Help needed moving to and from a bed to a chair (including a  wheelchair)?: None Help needed standing up from a chair using your arms (e.g., wheelchair or bedside chair)?: None Help needed to walk in hospital room?: None Help needed climbing 3-5 steps with a railing? : None 6 Click Score: 24    End of Session   Activity Tolerance: Patient tolerated treatment well Patient left: with family/visitor present;in chair;with call bell/phone within reach Nurse Communication: Mobility status      Time: 1300-1308 PT Time Calculation (min) (ACUTE ONLY): 8 min   Charges:   PT Evaluation $PT Eval Low Complexity: 1 Low   PT General Charges $$ ACUTE PT VISIT: 1 Visit         Sylvan Delon Copp PT 12/07/2024  Acute Rehabilitation Services  Office [phone]

## 2024-12-08 LAB — URINE CULTURE: Culture: >=100000 — AB

## 2024-12-10 LAB — CULTURE, BLOOD (ROUTINE X 2)
Culture: NO GROWTH
Culture: NO GROWTH
Special Requests: ADEQUATE
Special Requests: ADEQUATE

## 2024-12-18 ENCOUNTER — Inpatient Hospital Stay: Admitting: Internal Medicine

## 2024-12-25 ENCOUNTER — Inpatient Hospital Stay: Admitting: Internal Medicine

## 2024-12-28 ENCOUNTER — Ambulatory Visit

## 2024-12-28 ENCOUNTER — Other Ambulatory Visit

## 2024-12-28 VITALS — Ht 63.0 in | Wt 161.2 lb

## 2024-12-28 DIAGNOSIS — Z1211 Encounter for screening for malignant neoplasm of colon: Secondary | ICD-10-CM

## 2024-12-28 MED ORDER — NA SULFATE-K SULFATE-MG SULF 17.5-3.13-1.6 GM/177ML PO SOLN: 1.0000 | Freq: Once | ORAL | 0 refills | Status: AC

## 2024-12-28 NOTE — Progress Notes (Signed)

## 2025-01-01 ENCOUNTER — Encounter: Payer: Self-pay | Admitting: Internal Medicine

## 2025-01-01 ENCOUNTER — Ambulatory Visit: Admitting: Internal Medicine

## 2025-01-01 VITALS — BP 122/76 | HR 64 | Temp 98.3°F | Ht 63.0 in | Wt 160.0 lb

## 2025-01-01 DIAGNOSIS — I10 Essential (primary) hypertension: Secondary | ICD-10-CM | POA: Diagnosis not present

## 2025-01-01 DIAGNOSIS — R739 Hyperglycemia, unspecified: Secondary | ICD-10-CM

## 2025-01-01 DIAGNOSIS — D239 Other benign neoplasm of skin, unspecified: Secondary | ICD-10-CM | POA: Insufficient documentation

## 2025-01-01 DIAGNOSIS — L821 Other seborrheic keratosis: Secondary | ICD-10-CM | POA: Insufficient documentation

## 2025-01-01 DIAGNOSIS — N390 Urinary tract infection, site not specified: Secondary | ICD-10-CM | POA: Diagnosis not present

## 2025-01-01 DIAGNOSIS — N898 Other specified noninflammatory disorders of vagina: Secondary | ICD-10-CM

## 2025-01-01 MED ORDER — FLUCONAZOLE 150 MG PO TABS: ORAL_TABLET | ORAL | 1 refills | Status: AC

## 2025-01-01 NOTE — Assessment & Plan Note (Signed)
 BP Readings from Last 3 Encounters:  01/01/25 122/76  12/07/24 112/80  12/05/24 100/72   Stable, pt to continue medical treatment  - diet , wt control

## 2025-01-01 NOTE — Assessment & Plan Note (Signed)
 Lab Results  Component Value Date   HGBA1C 6.1 02/02/2024   Stable, pt to continue current medical treatment  - diet , wt control

## 2025-01-01 NOTE — Assessment & Plan Note (Signed)
 Likely candida - for trial diflucan  150 mg asd

## 2025-01-01 NOTE — Patient Instructions (Signed)
 Please take all new medication as prescribed - the diflucan , and if not improved we could consider flagyl   Please continue all other medications as before, and refills have been done if requested.  Please have the pharmacy call with any other refills you may need.  Please keep your appointments with your specialists as you may have planned

## 2025-01-01 NOTE — Progress Notes (Signed)
 Patient ID: Annette [name], female   DOB: January 26, 1956, 69 y.o.   MRN: [id number]        Chief Complaint: follow up post hospn Dec 10 - 12 2025       HPI:  Annette [name] is a 69 y.o. female here with above with sepsis , left pyelonephritis now resolved after IV rocephin  then cefadroxil  course at d/c.  Denies urinary symptoms such as dysuria, frequency, urgency, flank pain, hematuria or n/v, fever, chills.   Pt denies polydipsia, polyuria, or new focal neuro s/s.   Does have mild vaginal d/c after recent antibx.          Wt Readings from Last 3 Encounters:  01/01/25 160 lb (72.6 kg)  12/28/24 161 lb 3.2 oz (73.1 kg)  12/05/24 163 lb (73.9 kg)   BP Readings from Last 3 Encounters:  01/01/25 122/76  12/07/24 112/80  12/05/24 100/72         Past Medical History:  Diagnosis Date   ALLERGIC RHINITIS 10/01/2007   ANXIETY 11/10/2010   ATTENTION DEFICIT DISORDER 11/10/2010   BARTHOLIN'S CYST 08/20/2007   Carpal tunnel syndrome 08/20/2007   COLONIC POLYPS, HX OF 08/20/2007   DEPRESSION 08/20/2007   EMPHYSEMA 06/16/2010   HOT FLASHES 09/26/2009   HYPERLIPIDEMIA 08/20/2007   HYPERTENSION 08/20/2007   Insomnia, unspecified 10/01/2007   ISCHEMIC COLITIS, HX OF 10/01/2007   MENOPAUSAL DISORDER 11/07/2009   OBESITY, MILD 08/20/2007   OSTEOPENIA 06/16/2010   POLYARTHRALGIA 11/06/2008   Raynaud's syndrome 08/20/2007   Past Surgical History:  Procedure Laterality Date   BARTHOLIN GLAND CYST EXCISION     20 YEARS AGO   TONSILLECTOMY      reports that she quit smoking about 34 years ago. Her smoking use included cigarettes. She has never used smokeless tobacco. She reports current alcohol use of about 1.0 standard drink of alcohol per week. She reports that she does not use drugs. family history includes ADD / ADHD in her daughter; Breast cancer in her maternal grandmother and another family member; Breast cancer (age of onset: 35) in her niece; Coronary artery disease in an other family member;  Hypertension in an other family member; Osteoporosis in her maternal grandmother and mother; Pancreatic cancer in her father; Prostate cancer in an other family member. Allergies[1] Medications Ordered Prior to Encounter[2]      ROS:  All others reviewed and negative.  Objective        PE:  BP 122/76 (BP Location: Right Arm, Patient Position: Sitting, Cuff Size: Normal)   Pulse 64   Temp 98.3 F (36.8 C) (Oral)   Ht 5' 3 (1.6 m)   Wt 160 lb (72.6 kg)   SpO2 98%   BMI 28.34 kg/m                 Constitutional: Pt appears in NAD               HENT: Head: NCAT.                Right Ear: External ear normal.                 Left Ear: External ear normal.                Eyes: . Pupils are equal, round, and reactive to light. Conjunctivae and EOM are normal               Nose: without d/c or deformity  Neck: Neck supple. Gross normal ROM               Cardiovascular: Normal rate and regular rhythm.                 Pulmonary/Chest: Effort normal and breath sounds without rales or wheezing.                Abd:  Soft, NT, ND, + BS, no organomegaly               Neurological: Pt is alert. At baseline orientation, motor grossly intact               Skin: Skin is warm. No rashes, no other new lesions, LE edema - none               Psychiatric: Pt behavior is normal without agitation   Micro: none  Cardiac tracings I have personally interpreted today:  none  Pertinent Radiological findings (summarize): none   Lab Results  Component Value Date   WBC 11.7 (H) 12/07/2024   HGB 8.9 (L) 12/07/2024   HCT 27.4 (L) 12/07/2024   PLT 513 (H) 12/07/2024   GLUCOSE 101 (H) 12/07/2024   CHOL 142 02/02/2024   TRIG 100.0 02/02/2024   HDL 47.00 02/02/2024   LDLDIRECT 137.0 12/11/2013   LDLCALC 75 02/02/2024   ALT 92 (H) 12/07/2024   AST 65 (H) 12/07/2024   NA 139 12/07/2024   K 3.8 12/07/2024   CL 106 12/07/2024   CREATININE 0.89 12/07/2024   BUN 12 12/07/2024   CO2 22  12/07/2024   TSH 2.62 02/02/2024   INR 1.2 12/06/2024   HGBA1C 6.1 02/02/2024   Assessment/Plan:  Annette [name] is a 69 y.o. Black or African American [2] female with  has a past medical history of ALLERGIC RHINITIS (10/01/2007), ANXIETY (11/10/2010), ATTENTION DEFICIT DISORDER (11/10/2010), BARTHOLIN'S CYST (08/20/2007), Carpal tunnel syndrome (08/20/2007), COLONIC POLYPS, HX OF (08/20/2007), DEPRESSION (08/20/2007), EMPHYSEMA (06/16/2010), HOT FLASHES (09/26/2009), HYPERLIPIDEMIA (08/20/2007), HYPERTENSION (08/20/2007), Insomnia, unspecified (10/01/2007), ISCHEMIC COLITIS, HX OF (10/01/2007), MENOPAUSAL DISORDER (11/07/2009), OBESITY, MILD (08/20/2007), OSTEOPENIA (06/16/2010), POLYARTHRALGIA (11/06/2008), and Raynaud's syndrome (08/20/2007).  UTI (urinary tract infection) With recent sepsis left pyelonphritis now resolved, currently asymptomatic,  to f/u any worsening symptoms or concerns  Vaginal discharge Likely candida - for trial diflucan  150 mg asd  Essential hypertension BP Readings from Last 3 Encounters:  01/01/25 122/76  12/07/24 112/80  12/05/24 100/72   Stable, pt to continue medical treatment  - diet , wt control   Hyperglycemia Lab Results  Component Value Date   HGBA1C 6.1 02/02/2024   Stable, pt to continue current medical treatment  - diet,wt control  Followup: Return if symptoms worsen or fail to improve.  Lynwood Rush, MD 01/01/2025 7:40 PM [facility]     [1] No Known Allergies [2]  Current Outpatient Medications on File Prior to Visit  Medication Sig Dispense Refill   albuterol  (VENTOLIN  HFA) 108 (90 Base) MCG/ACT inhaler Inhale 2 puffs into the lungs every 6 (six) hours as needed for wheezing or shortness of breath. 3 each 3   alendronate  (FOSAMAX ) 70 MG tablet Take 1 tablet (70 mg total) by mouth every 7 (seven) days. Take with a full glass of water on an empty stomach. 12 tablet 3   aspirin  EC  81 MG tablet Take 1 tablet (81 mg total) by mouth daily. Swallow whole. 90 tablet 3  Estradiol  (VAGIFEM ) 10 MCG TABS vaginal tablet Place 1 tablet (10 mcg total) vaginally 2 (two) times a week. 24 tablet 3   fluticasone  (FLONASE ) 50 MCG/ACT nasal spray Place 2 sprays into both nostrils 2 (two) times daily as needed (Nasal congestion and drainage). 9.9 mL 0   gabapentin  (NEURONTIN ) 300 MG capsule Take 1 capsule (300 mg total) by mouth 3 (three) times daily as needed. 90 capsule 5   Na Sulfate-K Sulfate-Mg Sulfate concentrate (SUPREP) 17.5-3.13-1.6 GM/177ML SOLN SMARTSIG:1 Kit(s) By Mouth Once     rosuvastatin  (CRESTOR ) 20 MG tablet Take 1 tablet (20 mg total) by mouth daily. 90 tablet 3   zolpidem  (AMBIEN ) 10 MG tablet Take 1 tablet (10 mg total) by mouth at bedtime as needed. 90 tablet 1   No current facility-administered medications on file prior to visit.

## 2025-01-01 NOTE — Assessment & Plan Note (Signed)
 With recent sepsis left pyelonphritis now resolved, currently asymptomatic,  to f/u any worsening symptoms or concerns

## 2025-01-07 ENCOUNTER — Encounter: Payer: Self-pay | Admitting: Internal Medicine

## 2025-01-07 ENCOUNTER — Ambulatory Visit: Admitting: Internal Medicine

## 2025-01-07 VITALS — BP 93/61 | HR 57 | Temp 97.0°F | Resp 12 | Ht 65.0 in | Wt 161.0 lb

## 2025-01-07 DIAGNOSIS — K635 Polyp of colon: Secondary | ICD-10-CM

## 2025-01-07 DIAGNOSIS — Z860101 Personal history of adenomatous and serrated colon polyps: Secondary | ICD-10-CM

## 2025-01-07 DIAGNOSIS — D120 Benign neoplasm of cecum: Secondary | ICD-10-CM

## 2025-01-07 DIAGNOSIS — Z1211 Encounter for screening for malignant neoplasm of colon: Secondary | ICD-10-CM

## 2025-01-07 MED ORDER — SODIUM CHLORIDE 0.9 % IV SOLN: 500.0000 mL | Freq: Once | INTRAVENOUS | Status: DC

## 2025-01-07 NOTE — Progress Notes (Signed)
 "   GASTROENTEROLOGY PROCEDURE H&P NOTE   Primary Care Physician: Norleen Lynwood ORN, MD    Reason for Procedure:  History of adenomatous colon polyp  Plan:    Colonoscopy  Patient is appropriate for endoscopic procedure(s) in the ambulatory ([facility]) setting.  The nature of the procedure, as well as the risks, benefits, and alternatives were carefully and thoroughly reviewed with the patient. Ample time for discussion and questions allowed.  All questions were answered. The patient understood, was satisfied, and agreed with the plan to proceed.    HPI: Annette [name] is a 69 y.o. female who presents for surveillance colonoscopy.  Medical history as below.  Tolerated the prep.  No recent chest pain or shortness of breath.  No abdominal pain today.  Past Medical History:  Diagnosis Date   ALLERGIC RHINITIS 10/01/2007   ANXIETY 11/10/2010   ATTENTION DEFICIT DISORDER 11/10/2010   BARTHOLIN'S CYST 08/20/2007   Carpal tunnel syndrome 08/20/2007   COLONIC POLYPS, HX OF 08/20/2007   DEPRESSION 08/20/2007   EMPHYSEMA 06/16/2010   HOT FLASHES 09/26/2009   HYPERLIPIDEMIA 08/20/2007   HYPERTENSION 08/20/2007   Insomnia, unspecified 10/01/2007   ISCHEMIC COLITIS, HX OF 10/01/2007   MENOPAUSAL DISORDER 11/07/2009   OBESITY, MILD 08/20/2007   OSTEOPENIA 06/16/2010   POLYARTHRALGIA 11/06/2008   Raynaud's syndrome 08/20/2007    Past Surgical History:  Procedure Laterality Date   BARTHOLIN GLAND CYST EXCISION     20 YEARS AGO   TONSILLECTOMY      Prior to Admission medications  Medication Sig Start Date End Date Taking? Authorizing Provider  alendronate  (FOSAMAX ) 70 MG tablet Take 1 tablet (70 mg total) by mouth every 7 (seven) days. Take with a full glass of water on an empty stomach. 09/04/24  Yes Norleen Lynwood ORN, MD  aspirin  EC 81 MG tablet Take 1 tablet (81 mg total) by mouth daily. Swallow whole. 02/05/22  Yes Thukkani, Arun K, MD  Na Sulfate-K Sulfate-Mg Sulfate concentrate (SUPREP) 17.5-3.13-1.6  GM/177ML SOLN SMARTSIG:1 Kit(s) By Mouth Once 12/28/24  Yes [provider]  rosuvastatin  (CRESTOR ) 20 MG tablet Take 1 tablet (20 mg total) by mouth daily. 02/07/24  Yes Norleen Lynwood ORN, MD  albuterol  (VENTOLIN  HFA) 108 [phone] Base) MCG/ACT inhaler Inhale 2 puffs into the lungs every 6 (six) hours as needed for wheezing or shortness of breath. 07/18/24   Norleen Lynwood ORN, MD  Estradiol  (VAGIFEM ) 10 MCG TABS vaginal tablet Place 1 tablet (10 mcg total) vaginally 2 (two) times a week. 02/04/22   Norleen Lynwood ORN, MD  fluconazole  (DIFLUCAN ) 150 MG tablet 1 tab by mouth every 3 days as needed 01/01/25   Norleen Lynwood ORN, MD  fluticasone  (FLONASE ) 50 MCG/ACT nasal spray Place 2 sprays into both nostrils 2 (two) times daily as needed (Nasal congestion and drainage). 02/23/24   Arloa Suzen RAMAN, NP  gabapentin  (NEURONTIN ) 300 MG capsule Take 1 capsule (300 mg total) by mouth 3 (three) times daily as needed. 07/18/24   Norleen Lynwood ORN, MD  zolpidem  (AMBIEN ) 10 MG tablet Take 1 tablet (10 mg total) by mouth at bedtime as needed. 02/07/24   Norleen Lynwood ORN, MD    Current Outpatient Medications  Medication Sig Dispense Refill   alendronate  (FOSAMAX ) 70 MG tablet Take 1 tablet (70 mg total) by mouth every 7 (seven) days. Take with a full glass of water on an empty stomach. 12 tablet 3   aspirin  EC 81 MG tablet Take 1 tablet (81 mg total) by  mouth daily. Swallow whole. 90 tablet 3   Na Sulfate-K Sulfate-Mg Sulfate concentrate (SUPREP) 17.5-3.13-1.6 GM/177ML SOLN SMARTSIG:1 Kit(s) By Mouth Once     rosuvastatin  (CRESTOR ) 20 MG tablet Take 1 tablet (20 mg total) by mouth daily. 90 tablet 3   albuterol  (VENTOLIN  HFA) 108 (90 Base) MCG/ACT inhaler Inhale 2 puffs into the lungs every 6 (six) hours as needed for wheezing or shortness of breath. 3 each 3   Estradiol  (VAGIFEM ) 10 MCG TABS vaginal tablet Place 1 tablet (10 mcg total) vaginally 2 (two) times a week. 24 tablet 3   fluconazole  (DIFLUCAN ) 150 MG tablet 1 tab by mouth every  3 days as needed 2 tablet 1   fluticasone  (FLONASE ) 50 MCG/ACT nasal spray Place 2 sprays into both nostrils 2 (two) times daily as needed (Nasal congestion and drainage). 9.9 mL 0   gabapentin  (NEURONTIN ) 300 MG capsule Take 1 capsule (300 mg total) by mouth 3 (three) times daily as needed. 90 capsule 5   zolpidem  (AMBIEN ) 10 MG tablet Take 1 tablet (10 mg total) by mouth at bedtime as needed. 90 tablet 1   Current Facility-Administered Medications  Medication Dose Route Frequency Provider Last Rate Last Admin   0.9 %  sodium chloride  infusion  500 mL Intravenous Once Jasier Calabretta, Gordy HERO, MD        Allergies as of 01/07/2025   (No Known Allergies)    Family History  Problem Relation Age of Onset   Osteoporosis Mother    Pancreatic cancer Father        dx. 46s   Osteoporosis Maternal Grandmother    Breast cancer Maternal Grandmother        dx. 46s   ADD / ADHD Daughter    Breast cancer Niece 59   Coronary artery disease Other    Hypertension Other    Prostate cancer Other    Breast cancer Other        dx. 50s   Colon cancer Neg Hx    Esophageal cancer Neg Hx    Stomach cancer Neg Hx    Rectal cancer Neg Hx     Social History   Socioeconomic History   Marital status: Married    Spouse name: Not on file   Number of children: 1   Years of education: Not on file   Highest education level: Not on file  Occupational History    Employer: [employer]  Tobacco Use   Smoking status: Former    Current packs/day: 0.00    Types: Cigarettes    Quit date: 12/27/1990    Years since quitting: 34.0   Smokeless tobacco: Never  Vaping Use   Vaping status: Never Used  Substance and Sexual Activity   Alcohol use: Yes    Alcohol/week: 1.0 standard drink of alcohol    Types: 1 Glasses of wine per week    Comment: SOCIALLY   Drug use: No   Sexual activity: Yes  Other Topics Concern   Not on file  Social History Narrative   Married   Social Drivers of Health   Tobacco  Use: Medium Risk (01/01/2025)   Patient History    Smoking Tobacco Use: Former    Smokeless Tobacco Use: Never    Passive Exposure: Not on Actuary Strain: Not on file  Food Insecurity: No Food Insecurity (12/05/2024)   Epic    Worried About Radiation Protection Practitioner of Food in the Last Year: Never true    Ran  Out of Food in the Last Year: Never true  Transportation Needs: No Transportation Needs (12/05/2024)   Epic    Lack of Transportation (Medical): No    Lack of Transportation (Non-Medical): No  Physical Activity: Sufficiently Active (11/13/2024)   Exercise Vital Sign    Days of Exercise per Week: 5 days    Minutes of Exercise per Session: 30 min  Stress: No Stress Concern Present (11/13/2024)   [employer] of Occupational Health - Occupational Stress Questionnaire    Feeling of Stress: Not at all  Social Connections: Socially Integrated (12/05/2024)   Social Connection and Isolation Panel    Frequency of Communication with Friends and Family: More than three times a week    Frequency of Social Gatherings with Friends and Family: Once a week    Attends Religious Services: 1 to 4 times per year    Active Member of [employer] or Organizations: Yes    Attends Banker Meetings: Never    Marital Status: Married  Catering Manager Violence: Not At Risk (12/05/2024)   Epic    Fear of Current or Ex-Partner: No    Emotionally Abused: No    Physically Abused: No    Sexually Abused: No  Depression (PHQ2-9): Low Risk (01/01/2025)   Depression (PHQ2-9)    PHQ-2 Score: 0  Alcohol Screen: Not on file  Housing: Low Risk (12/05/2024)   Epic    Unable to Pay for Housing in the Last Year: No    Number of Times Moved in the Last Year: 0    Homeless in the Last Year: No  Utilities: Not At Risk (12/05/2024)   Epic    Threatened with loss of utilities: No  Health Literacy: Adequate Health Literacy (11/13/2024)   B1300 Health Literacy    Frequency of need for help with medical  instructions: Never    Physical Exam: Vital signs in last 24 hours: @BP  109/62   Pulse 62   Temp (!) 97 F (36.1 C)   Ht 5' 5 (1.651 m)   Wt 161 lb (73 kg)   SpO2 98%   BMI 26.79 kg/m  GEN: NAD EYE: Sclerae anicteric ENT: MMM CV: Non-tachycardic Pulm: CTA b/l GI: Soft, NT/ND NEURO:  Alert & Oriented x 3   Gordy Starch, MD [facility]  01/07/2025 8:55 AM  "

## 2025-01-07 NOTE — Patient Instructions (Signed)

## 2025-01-07 NOTE — Progress Notes (Signed)
 Called to room to assist during endoscopic procedure.  Patient ID and intended procedure confirmed with present staff. Received instructions for my participation in the procedure from the performing physician.

## 2025-01-07 NOTE — Progress Notes (Signed)
 Pt's states no medical or surgical changes since previsit or office visit.

## 2025-01-07 NOTE — Op Note (Signed)
 [facility] Patient Name: Annette [name] Procedure Date: 01/07/2025 8:58 AM MRN: [id number] Endoscopist: Gordy CHRISTELLA Starch , MD, [id number] Age: 69 Referring MD:  Date of Birth: 09-Jun-1956 Gender: Female Account #: [id number] Procedure:                Colonoscopy Indications:              High risk colon cancer surveillance: Personal                            history of non-advanced adenoma, Last colonoscopy:                            December 2020 (TA x 1) with Dr. Kristie Medicines:                Monitored Anesthesia Care Procedure:                Pre-Anesthesia Assessment:                           - Prior to the procedure, a History and Physical                            was performed, and patient medications and                            allergies were reviewed. The patient's tolerance of                            previous anesthesia was also reviewed. The risks                            and benefits of the procedure and the sedation                            options and risks were discussed with the patient.                            All questions were answered, and informed consent                            was obtained. Prior Anticoagulants: The patient has                            taken no anticoagulant or antiplatelet agents. ASA                            Grade Assessment: II - A patient with mild systemic                            disease. After reviewing the risks and benefits,                            the patient was deemed in satisfactory condition to  undergo the procedure.                           After obtaining informed consent, the colonoscope                            was passed under direct vision. Throughout the                            procedure, the patient's blood pressure, pulse, and                            oxygen saturations were monitored continuously. The                            Olympus Scope SN: [id number]  was introduced through                            the anus and advanced to the cecum, identified by                            appendiceal orifice and ileocecal valve. The                            colonoscopy was performed without difficulty. The                            patient tolerated the procedure well. The quality                            of the bowel preparation was good. The ileocecal                            valve, appendiceal orifice, and rectum were                            photographed. Scope In: 9:14:15 AM Scope Out: 9:26:03 AM Scope Withdrawal Time: 0 hours 8 minutes 45 seconds  Total Procedure Duration: 0 hours 11 minutes 48 seconds  Findings:                 The perianal and digital rectal examinations were                            normal.                           A 4 mm polyp was found in the cecum. The polyp was                            sessile. The polyp was removed with a cold snare.                            Resection and retrieval were complete.  The exam was otherwise without abnormality on                            direct and retroflexion views. Complications:            No immediate complications. Estimated Blood Loss:     Estimated blood loss: none. Impression:               - One 4 mm polyp in the cecum, removed with a cold                            snare. Resected and retrieved.                           - The examination was otherwise normal on direct                            and retroflexion views. Recommendation:           - Patient has a contact number available for                            emergencies. The signs and symptoms of potential                            delayed complications were discussed with the                            patient. Return to normal activities tomorrow.                            Written discharge instructions were provided to the                            patient.                            - Resume previous diet.                           - Continue present medications.                           - Await pathology results.                           - Repeat colonoscopy is recommended for                            surveillance. The colonoscopy date will be                            determined after pathology results from today's                            exam become available for review. Gordy CHRISTELLA Starch, MD 01/07/2025 9:28:05 AM This report has been signed  electronically.

## 2025-01-07 NOTE — Progress Notes (Signed)
 Transferred to PACU via stretcher.  Not responding to stimulation at this time.  VSS upon leaving procedure room.

## 2025-01-08 ENCOUNTER — Telehealth: Payer: Self-pay

## 2025-01-08 NOTE — Telephone Encounter (Signed)
" °  Follow up Call-     01/07/2025    8:18 AM  Call back number  Post procedure Call Back phone  # [phone]  Permission to leave phone message Yes     Patient questions:  Do you have a fever, pain , or abdominal swelling? No. Pain Score  0 *  Have you tolerated food without any problems? Yes.    Have you been able to return to your normal activities? Yes.    Do you have any questions about your discharge instructions: Diet   No. Medications  No. Follow up visit  No.  Do you have questions or concerns about your Care? No.  Actions: * If pain score is 4 or above: No action needed, pain <4.   "

## 2025-01-10 LAB — SURGICAL PATHOLOGY

## 2025-01-11 ENCOUNTER — Ambulatory Visit: Payer: Self-pay | Admitting: Internal Medicine

## 2025-01-23 ENCOUNTER — Encounter: Payer: Self-pay | Admitting: Internal Medicine

## 2025-01-29 ENCOUNTER — Other Ambulatory Visit (HOSPITAL_BASED_OUTPATIENT_CLINIC_OR_DEPARTMENT_OTHER): Payer: Self-pay

## 2025-01-29 NOTE — Telephone Encounter (Signed)
 We can change the recall to 7 years  Let patient know that national guidelines are based on robust data. Given her concern we can repeat in 7 years JMP

## 2025-02-12 ENCOUNTER — Encounter: Admitting: Internal Medicine

## 2025-02-20 ENCOUNTER — Encounter: Admitting: Internal Medicine

## 2025-05-22 ENCOUNTER — Ambulatory Visit: Admitting: Physician Assistant

## 2025-11-15 ENCOUNTER — Ambulatory Visit
